# Patient Record
Sex: Female | Born: 1975 | Race: Black or African American | Hispanic: No | Marital: Married | Smoking: Never smoker
Health system: Southern US, Community
[De-identification: ages and names within clinical notes are randomized; demographics above are authoritative.]

## PROBLEM LIST (undated history)

## (undated) DIAGNOSIS — E559 Vitamin D deficiency, unspecified: Secondary | ICD-10-CM

## (undated) DIAGNOSIS — E785 Hyperlipidemia, unspecified: Secondary | ICD-10-CM

## (undated) DIAGNOSIS — I10 Essential (primary) hypertension: Secondary | ICD-10-CM

## (undated) HISTORY — DX: Essential (primary) hypertension: I10

## (undated) HISTORY — DX: Vitamin D deficiency, unspecified: E55.9

## (undated) HISTORY — PX: UPPER GI ENDOSCOPY: SHX6162

## (undated) HISTORY — DX: Hyperlipidemia, unspecified: E78.5

## (undated) HISTORY — PX: COLONOSCOPY: SHX174

---

## 1999-04-17 ENCOUNTER — Other Ambulatory Visit: Admission: RE | Admit: 1999-04-17 | Discharge: 1999-04-17 | Payer: Self-pay | Admitting: Obstetrics and Gynecology

## 1999-08-20 ENCOUNTER — Emergency Department (HOSPITAL_COMMUNITY): Admission: EM | Admit: 1999-08-20 | Discharge: 1999-08-20 | Payer: Self-pay | Admitting: Emergency Medicine

## 1999-09-09 ENCOUNTER — Encounter: Payer: Self-pay | Admitting: Family Medicine

## 1999-09-09 ENCOUNTER — Ambulatory Visit (HOSPITAL_COMMUNITY): Admission: RE | Admit: 1999-09-09 | Discharge: 1999-09-09 | Payer: Self-pay | Admitting: Family Medicine

## 1999-10-01 ENCOUNTER — Ambulatory Visit (HOSPITAL_COMMUNITY): Admission: RE | Admit: 1999-10-01 | Discharge: 1999-10-01 | Payer: Self-pay | Admitting: Family Medicine

## 1999-10-01 ENCOUNTER — Encounter: Payer: Self-pay | Admitting: Family Medicine

## 2000-01-01 DIAGNOSIS — E049 Nontoxic goiter, unspecified: Secondary | ICD-10-CM | POA: Insufficient documentation

## 2000-05-13 ENCOUNTER — Inpatient Hospital Stay (HOSPITAL_COMMUNITY): Admission: AD | Admit: 2000-05-13 | Discharge: 2000-05-13 | Payer: Self-pay | Admitting: *Deleted

## 2001-06-10 ENCOUNTER — Emergency Department (HOSPITAL_COMMUNITY): Admission: EM | Admit: 2001-06-10 | Discharge: 2001-06-10 | Payer: Self-pay | Admitting: Emergency Medicine

## 2002-01-14 ENCOUNTER — Other Ambulatory Visit: Admission: RE | Admit: 2002-01-14 | Discharge: 2002-01-14 | Payer: Self-pay | Admitting: Obstetrics and Gynecology

## 2002-05-06 ENCOUNTER — Other Ambulatory Visit: Admission: RE | Admit: 2002-05-06 | Discharge: 2002-05-06 | Payer: Self-pay | Admitting: Obstetrics and Gynecology

## 2002-07-31 ENCOUNTER — Emergency Department (HOSPITAL_COMMUNITY): Admission: EM | Admit: 2002-07-31 | Discharge: 2002-08-01 | Payer: Self-pay | Admitting: Emergency Medicine

## 2002-12-14 ENCOUNTER — Other Ambulatory Visit: Admission: RE | Admit: 2002-12-14 | Discharge: 2002-12-14 | Payer: Self-pay | Admitting: Obstetrics and Gynecology

## 2003-09-11 ENCOUNTER — Encounter: Admission: RE | Admit: 2003-09-11 | Discharge: 2003-09-11 | Payer: Self-pay | Admitting: Infectious Diseases

## 2003-10-02 ENCOUNTER — Ambulatory Visit: Payer: Self-pay | Admitting: Internal Medicine

## 2003-10-16 ENCOUNTER — Other Ambulatory Visit: Admission: RE | Admit: 2003-10-16 | Discharge: 2003-10-16 | Payer: Self-pay | Admitting: Obstetrics and Gynecology

## 2003-11-20 ENCOUNTER — Other Ambulatory Visit: Admission: RE | Admit: 2003-11-20 | Discharge: 2003-11-20 | Payer: Self-pay | Admitting: Obstetrics and Gynecology

## 2003-12-21 ENCOUNTER — Ambulatory Visit (HOSPITAL_COMMUNITY): Admission: RE | Admit: 2003-12-21 | Discharge: 2003-12-21 | Payer: Self-pay | Admitting: Obstetrics and Gynecology

## 2004-02-26 ENCOUNTER — Emergency Department (HOSPITAL_COMMUNITY): Admission: EM | Admit: 2004-02-26 | Discharge: 2004-02-26 | Payer: Self-pay | Admitting: Family Medicine

## 2004-11-18 ENCOUNTER — Other Ambulatory Visit: Admission: RE | Admit: 2004-11-18 | Discharge: 2004-11-18 | Payer: Self-pay | Admitting: Obstetrics and Gynecology

## 2005-06-12 ENCOUNTER — Emergency Department (HOSPITAL_COMMUNITY): Admission: EM | Admit: 2005-06-12 | Discharge: 2005-06-12 | Payer: Self-pay | Admitting: Family Medicine

## 2006-03-02 ENCOUNTER — Emergency Department (HOSPITAL_COMMUNITY): Admission: EM | Admit: 2006-03-02 | Discharge: 2006-03-02 | Payer: Self-pay | Admitting: Family Medicine

## 2006-03-05 ENCOUNTER — Emergency Department (HOSPITAL_COMMUNITY): Admission: EM | Admit: 2006-03-05 | Discharge: 2006-03-05 | Payer: Self-pay | Admitting: Family Medicine

## 2007-07-30 ENCOUNTER — Inpatient Hospital Stay (HOSPITAL_COMMUNITY): Admission: AD | Admit: 2007-07-30 | Discharge: 2007-07-31 | Payer: Self-pay | Admitting: Obstetrics and Gynecology

## 2007-10-10 ENCOUNTER — Inpatient Hospital Stay (HOSPITAL_COMMUNITY): Admission: AD | Admit: 2007-10-10 | Discharge: 2007-10-10 | Payer: Self-pay | Admitting: Obstetrics and Gynecology

## 2008-02-04 ENCOUNTER — Inpatient Hospital Stay (HOSPITAL_COMMUNITY): Admission: AD | Admit: 2008-02-04 | Discharge: 2008-02-05 | Payer: Self-pay | Admitting: Obstetrics and Gynecology

## 2008-02-11 ENCOUNTER — Inpatient Hospital Stay (HOSPITAL_COMMUNITY): Admission: AD | Admit: 2008-02-11 | Discharge: 2008-02-15 | Payer: Self-pay | Admitting: Obstetrics and Gynecology

## 2008-02-11 ENCOUNTER — Encounter (INDEPENDENT_AMBULATORY_CARE_PROVIDER_SITE_OTHER): Payer: Self-pay | Admitting: Obstetrics and Gynecology

## 2008-02-16 ENCOUNTER — Encounter: Admission: RE | Admit: 2008-02-16 | Discharge: 2008-03-14 | Payer: Self-pay | Admitting: Obstetrics and Gynecology

## 2008-10-31 ENCOUNTER — Ambulatory Visit: Payer: Self-pay | Admitting: Family

## 2008-10-31 DIAGNOSIS — N390 Urinary tract infection, site not specified: Secondary | ICD-10-CM

## 2008-10-31 DIAGNOSIS — I10 Essential (primary) hypertension: Secondary | ICD-10-CM | POA: Insufficient documentation

## 2008-10-31 DIAGNOSIS — N760 Acute vaginitis: Secondary | ICD-10-CM | POA: Insufficient documentation

## 2008-10-31 DIAGNOSIS — E669 Obesity, unspecified: Secondary | ICD-10-CM

## 2008-10-31 LAB — CONVERTED CEMR LAB
Clue Cells Wet Prep HPF POC: NONE SEEN
Specific Gravity, Urine: 1.025
Trich, Wet Prep: NONE SEEN
pH: 6.5

## 2008-11-01 ENCOUNTER — Encounter: Payer: Self-pay | Admitting: Family

## 2008-11-01 LAB — CONVERTED CEMR LAB
Chlamydia, DNA Probe: NEGATIVE
GC Probe Amp, Genital: NEGATIVE
TSH: 0.827 microintl units/mL (ref 0.350–4.500)
Total CHOL/HDL Ratio: 4.9

## 2009-03-28 ENCOUNTER — Ambulatory Visit: Payer: Self-pay | Admitting: Family

## 2009-03-28 LAB — CONVERTED CEMR LAB
Glucose, Urine, Semiquant: NEGATIVE
Ketones, urine, test strip: NEGATIVE
Specific Gravity, Urine: 1.015
pH: 6.5

## 2009-03-30 ENCOUNTER — Encounter: Payer: Self-pay | Admitting: Family

## 2009-04-11 ENCOUNTER — Ambulatory Visit: Payer: Self-pay | Admitting: Family

## 2009-04-11 LAB — CONVERTED CEMR LAB
Chloride: 99 meq/L (ref 96–112)
Creatinine, Ser: 1.04 mg/dL (ref 0.40–1.20)

## 2009-04-12 ENCOUNTER — Telehealth (INDEPENDENT_AMBULATORY_CARE_PROVIDER_SITE_OTHER): Payer: Self-pay | Admitting: *Deleted

## 2009-04-12 ENCOUNTER — Telehealth: Payer: Self-pay | Admitting: Family

## 2009-05-07 ENCOUNTER — Ambulatory Visit: Payer: Self-pay | Admitting: Family

## 2009-05-07 LAB — CONVERTED CEMR LAB
BUN: 13 mg/dL (ref 6–23)
Chloride: 101 meq/L (ref 96–112)
Potassium: 4 meq/L (ref 3.5–5.3)

## 2009-10-31 ENCOUNTER — Encounter: Payer: Self-pay | Admitting: Family

## 2009-11-06 ENCOUNTER — Encounter: Payer: Self-pay | Admitting: Family

## 2009-11-26 ENCOUNTER — Ambulatory Visit: Payer: Self-pay | Admitting: Family

## 2009-11-26 DIAGNOSIS — R350 Frequency of micturition: Secondary | ICD-10-CM

## 2009-11-26 LAB — CONVERTED CEMR LAB
Bilirubin Urine: NEGATIVE
CO2: 23 meq/L (ref 19–32)
Chloride: 107 meq/L (ref 96–112)
Glucose, Bld: 91 mg/dL (ref 70–99)
Ketones, urine, test strip: NEGATIVE
Potassium: 4.3 meq/L (ref 3.5–5.3)
Sodium: 138 meq/L (ref 135–145)
Urobilinogen, UA: 0.2

## 2009-11-27 ENCOUNTER — Encounter: Payer: Self-pay | Admitting: Family

## 2009-11-29 ENCOUNTER — Telehealth: Payer: Self-pay | Admitting: Family

## 2009-11-30 ENCOUNTER — Encounter: Payer: Self-pay | Admitting: Family

## 2009-11-30 ENCOUNTER — Telehealth: Payer: Self-pay | Admitting: Family

## 2009-12-04 ENCOUNTER — Other Ambulatory Visit: Admission: RE | Admit: 2009-12-04 | Discharge: 2009-12-04 | Payer: Self-pay | Admitting: Internal Medicine

## 2009-12-04 ENCOUNTER — Ambulatory Visit: Payer: Self-pay | Admitting: Family

## 2009-12-04 LAB — HM PAP SMEAR

## 2009-12-07 ENCOUNTER — Encounter: Payer: Self-pay | Admitting: Family

## 2010-01-23 ENCOUNTER — Telehealth: Payer: Self-pay | Admitting: Family

## 2010-02-19 NOTE — Assessment & Plan Note (Signed)
Summary: 2 wk. f/u on BP- jr rsc with pt/mhf   Vital Signs:  Patient profile:   35 year old female Height:      66 inches Weight:      233.01 pounds BMI:     37.74 Temp:     98.3 degrees F oral Pulse rate:   82 / minute Pulse rhythm:   regular Resp:     18 per minute BP sitting:   132 / 100  (right arm) Cuff size:   large  Vitals Entered By: Mervin Kung CMA (May 07, 2009 8:37 AM) CC: room 6 jFollow up on blood pressure. Is Patient Diabetic? No   CC:  room 6 jFollow up on blood pressure.Marland Kitchen  History of Present Illness: Marie Perez is a 35 year old female who presents today for follow up of her BP.  Notes that she checked her BP was 138/86.  Denies LE swelling or HA.  Was fatigued but feels better with the addition of K+ supplement.    Allergies (verified): No Known Drug Allergies  Physical Exam  General:  Well-developed,well-nourished,in no acute distress; alert,appropriate and cooperative throughout examination Lungs:  Normal respiratory effort, chest expands symmetrically. Lungs are clear to auscultation, no crackles or wheezes. Heart:  Normal rate and regular rhythm. S1 and S2 normal without gallop, murmur, click, rub or other extra sounds. Extremities:  No edema   Impression & Recommendations:  Problem # 1:  HYPERTENSION (ICD-401.9) Assessment Unchanged SBP is improved, but DBP is acutally higher today,  will continue HCTZ and plan to increase Labetalol.  Follow up in 1 month.  Patient was counselled on obesity and weight loss during this 15 minute visit.  Greater than 50% of the time was spent counselling her on diet/weight loss.   Her updated medication list for this problem includes:    Hydrochlorothiazide 25 Mg Tabs (Hydrochlorothiazide) ..... One tablet by mouth daily    Labetalol Hcl 200 Mg Tabs (Labetalol hcl) ..... One tablet by mouth two times a day  BP today: 132/100 Prior BP: 146/90 (04/11/2009)  Labs Reviewed: K+: 3.4 (04/11/2009) Creat: : 1.04  (04/11/2009)   Chol: 195 (11/01/2008)   HDL: 40 (11/01/2008)   LDL: 134 (11/01/2008)   TG: 105 (11/01/2008)  Orders: T-Basic Metabolic Panel [id number])  Complete Medication List: 1)  Hydrochlorothiazide 25 Mg Tabs (Hydrochlorothiazide) .... One tablet by mouth daily 2)  Labetalol Hcl 200 Mg Tabs (Labetalol hcl) .... One tablet by mouth two times a day 3)  Potassium Chloride Cr 10 Meq Cr-tabs (Potassium chloride) .... One tablert by mouth daily  Patient Instructions: 1)  Please follow up in 1 month for a nurse visit/BP check.   Prescriptions: POTASSIUM CHLORIDE CR 10 MEQ CR-TABS (POTASSIUM CHLORIDE) one tablert by mouth daily  #30 x 1   Entered and Authorized by:   Lemont Fillers FNP   Signed by:   Lemont Fillers FNP on 05/07/2009   Method used:   Electronically to        [facility] [address] [phone]* (retail)       [address] [place], [place]  [address]       Ph: [phone]       Fax: [phone]   RxID:   [id number] LABETALOL HCL 200 MG TABS (LABETALOL HCL) one tablet by mouth two times a day  #60 x 1   Entered and Authorized by:   Lemont Fillers FNP  Signed by:   Lemont Fillers FNP on 05/07/2009   Method used:   Electronically to        [facility] [phone]* (retail)       [address] [place], [place]  [address]       Ph: [phone]       Fax: [phone]   RxID:   [id number]   Current Allergies (reviewed today): No known allergies

## 2010-02-19 NOTE — Progress Notes (Signed)
Summary: lab result--lm  Phone Note Outgoing Call   Summary of Call: Please call patient and let her know K+ is a little low, I would like to add a small potassium supplement and plan to have her return as scheduled 4/6. Initial call taken by: Lemont Fillers FNP,  April 12, 2009 11:34 PM  Follow-up for Phone Call        Left message on machine to return call. Mervin Kung CMA  April 13, 2009 11:48 AM   Additional Follow-up for Phone Call Additional follow up Details #1::        Pt. called back and I informed her that her K+ is a little low so Helaina Stefano called her in a potassium supplement and pt. to keep f/u appt. Additional Follow-up by: Michaelle Copas,  April 13, 2009 2:33 PM    New/Updated Medications: POTASSIUM CHLORIDE CR 10 MEQ CR-TABS (POTASSIUM CHLORIDE) one tablert by mouth daily Prescriptions: POTASSIUM CHLORIDE CR 10 MEQ CR-TABS (POTASSIUM CHLORIDE) one tablert by mouth daily  #30 x 0   Entered and Authorized by:   Lemont Fillers FNP   Signed by:   Lemont Fillers FNP on 04/12/2009   Method used:   Electronically to        [facility] [address] [phone]* (retail)       [address] [place], [place]  [address]       Ph: [phone]       Fax: [phone]   RxID:   [id number]

## 2010-02-19 NOTE — Letter (Signed)
   [place] at [facility] [address] [place], [place]  [address]  [place] Phone: [phone]      March 30, 2009   GALILEE PIERRON [address] [place], [place] [address]  RE:  LAB RESULTS  Dear  Ms. Krieger,  The following is an interpretation of your most recent lab tests.  Please take note of any instructions provided or changes to medications that have resulted from your lab work.    Urine culture did have some bacteria.  Please complete antibiotics and call if your symptoms worsen or do not improve.   Sincerely Yours,    Lemont Fillers FNP

## 2010-02-19 NOTE — Letter (Signed)
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 [address]   [place], [place] [address]   Phone: [phone]  Fax: [phone]    November 30, 2009   Employee:  BOBI DAUDELIN    To Whom It May Concern:   For Medical reasons, please excuse the above named employee from work for the following dates:  Start:   11/30/09  End:   12/03/09  If you need additional information, please feel free to contact our office.         Sincerely,    Lemont Fillers FNP

## 2010-02-19 NOTE — Letter (Signed)
   [place] at [facility] [address] [place], [place]  [address]  [place] Phone: [phone]      December 07, 2009   [facility] [address] [place], [place] [address]  RE:  LAB RESULTS  Dear  Ms. Ewen,  The following is an interpretation of your most recent lab tests.  Please take note of any instructions provided or changes to medications that have resulted from your lab work.  Pap Smear: normal     Sincerely Yours,    Lemont Fillers FNP  Appended Document:  Mailed.

## 2010-02-19 NOTE — Progress Notes (Signed)
Summary: Work Note  Phone Note Call from Patient Call back at [employer] [phone]   Caller: Patient Call For: Lemont Fillers FNP Summary of Call: patient called and left voice message stating she picked the rx and is feeling better since starting it. Her message states that she has taken the day off due to illness and she would lke to knolw if she could get a work note Initial call taken by: Glendell Docker CMA,  November 30, 2009 11:17 AM  Follow-up for Phone Call        Called patient, she is feeling better today- but stayed home from work to rest.  WIll provide note for her to pick up at the front desk.  She will pick up on Monday. Follow-up by: Lemont Fillers FNP,  November 30, 2009 1:17 PM

## 2010-02-19 NOTE — Letter (Signed)
Summary: Primary Care Appointment Letter  [place] at [facility]  [address]   [place], [place] [address]   Phone: [phone]  Fax: [phone]    10/31/2009 MRN: [id number]  KAILEA DANNEMILLER [address] [place], [place]  [address]  Dear Ms. Kestenbaum,   Your Primary Care Physician Lemont Fillers FNP has indicated that:    _______it is time to schedule an appointment.   Please call our office as soon as possible. Our phone number is [phone]. Please press option 1. Our office is open 8a-12noon and 1p-5p, Monday through Friday.     Thank you,     Primary Care Scheduler  Appended Document: Primary Care Appointment Letter Mailed letter  11/01/2009

## 2010-02-19 NOTE — Progress Notes (Signed)
Summary: new rx   Phone Note Refill Request Message from:  Fax from Pharmacy on April 12, 2009 8:28 AM  Refills Requested: Medication #1:  LABETALOL HCL 100 MG TABS one tablet by mouth two times a day.   Dosage confirmed as above?Dosage Confirmed   Brand Name Necessary? No   Supply Requested: 1 month needs new rx per conversation at visit    Method Requested: Electronic Next Appointment Scheduled: 04-25-09 8am [place]  Initial call taken by: Roselle Locus,  April 12, 2009 8:29 AM  Follow-up for Phone Call        Spoke with [facility] and cancelled rx sent on 04/11/09. Labetalol sent to [facility] #60 x no refill. Nicki Guadalajara Fergerson CMA  April 12, 2009 8:58 AM     Prescriptions: LABETALOL HCL 100 MG TABS (LABETALOL HCL) one tablet by mouth two times a day  #60 x 0   Entered by:   Mervin Kung CMA   Authorized by:   Lemont Fillers FNP   Signed by:   Mervin Kung CMA on 04/12/2009   Method used:   Electronically to        [facility] [address] [phone]* (retail)       [address] [place], [place]  [address]       Ph: [phone]       Fax: [phone]   RxID:   [id number]

## 2010-02-19 NOTE — Assessment & Plan Note (Signed)
Summary: 2 wk f/u BP Check - jr   Vital Signs:  Patient profile:   35 year old female Height:      66 inches Weight:      231.50 pounds BMI:     37.50 Temp:     98.6 degrees F oral Pulse rate:   76 / minute Pulse rhythm:   regular Resp:     16 per minute BP sitting:   146 / 90  (right arm) Cuff size:   large  Vitals Entered By: Mervin Kung CMA (April 11, 2009 1:35 PM) CC: room 5  2 week follow up on blood pressure Is Patient Diabetic? No   CC:  room 5  2 week follow up on blood pressure.  History of Present Illness: Marie Perez is a 35 year old female who presents today to follow up on her blood pressure.  Tells me that she has been compliant with her blood pressure medications.  Tolerating well, no complaints.    UTI- tells me that she completed cipro, urinary symptoms have resolved.  Culture grew 90K, polymicrobial.  Allergies (verified): No Known Drug Allergies  Review of Systems       Denies dysuria, or frequecy  Physical Exam  General:  Well-developed,well-nourished,in no acute distress; alert,appropriate and cooperative throughout examination Lungs:  Normal respiratory effort, chest expands symmetrically. Lungs are clear to auscultation, no crackles or wheezes. Heart:  Normal rate and regular rhythm. S1 and S2 normal without gallop, murmur, click, rub or other extra sounds. Extremities:  trace left pedal edema and trace right pedal edema.     Impression & Recommendations:  Problem # 1:  HYPERTENSION (ICD-401.9) Assessment Improved BP is improving, but not yet at goal.  Will plan to add labetalol and have patient return in 2 weeks for f/u BP.  Check BMET today. Her updated medication list for this problem includes:    Hydrochlorothiazide 25 Mg Tabs (Hydrochlorothiazide) ..... One tablet by mouth daily    Labetalol Hcl 100 Mg Tabs (Labetalol hcl) ..... One tablet by mouth two times a day  BP today: 146/90 Prior BP: 168/96 (03/28/2009)  Labs  Reviewed: Chol: 195 (11/01/2008)   HDL: 40 (11/01/2008)   LDL: 134 (11/01/2008)   TG: 105 (11/01/2008)  Orders: T-Basic Metabolic Panel [id number])  Problem # 2:  UTI (ICD-599.0) Assessment: Improved resolved The following medications were removed from the medication list:    Cipro 250 Mg Tabs (Ciprofloxacin hcl) ..... One tab by mouth two times a day x 3 days  Complete Medication List: 1)  Hydrochlorothiazide 25 Mg Tabs (Hydrochlorothiazide) .... One tablet by mouth daily 2)  Labetalol Hcl 100 Mg Tabs (Labetalol hcl) .... One tablet by mouth two times a day  Patient Instructions: 1)  Please follow up in 2 weeks for follow up of your blood pressure. Prescriptions: HYDROCHLOROTHIAZIDE 25 MG TABS (HYDROCHLOROTHIAZIDE) one tablet by mouth daily  #30 x 2   Entered and Authorized by:   Lemont Fillers FNP   Signed by:   Lemont Fillers FNP on 04/11/2009   Method used:   Electronically to        [facility] [address] [phone]* (retail)       [address] [place], [place]  [address]       Ph: [phone]       Fax: [phone]   RxID:   [id number] LABETALOL HCL 100 MG TABS (LABETALOL HCL) one tablet by mouth  two times a day  #60 x 0   Entered and Authorized by:   Lemont Fillers FNP   Signed by:   Lemont Fillers FNP on 04/11/2009   Method used:   [facility] to        [facility] [address] [phone]* (retail)       [address]       [place], [place]  [address]       Ph: [phone] or [phone]       Fax: [phone]   RxID:   [id number]   Current Allergies (reviewed today): No known allergies

## 2010-02-19 NOTE — Progress Notes (Signed)
Summary: no improvement  Phone Note Call from Patient Call back at [phone]   Caller: Patient Call For: Lemont Fillers FNP Summary of Call: Pt states she has had no improvement in head pressure or sinus drainage. Now has a non productive cough. Also  has vaginal discharge since taking the Amoxicillin. Pt states she was told we would call something else in if this did not help her symptoms. Pt aware it may be tomorrow before we get back with her. Please advise. Nicki Guadalajara Fergerson CMA Duncan Dull)  November 29, 2009 8:16 AM   Follow-up for Phone Call        Patient to discontinue amoxicillin, start ceftin.  Also [facility] to [facility] for diflucan.  If fever, worsening symptoms or if no improvement by tomorrow, she needs to be re-evaluated in the office.   Follow-up by: Lemont Fillers FNP,  November 29, 2009 11:43 AM  Additional Follow-up for Phone Call Additional follow up Details #1::        Left detailed message on pt's home phone re: Melissa's instructions. Nicki Guadalajara Fergerson CMA [place] Dull)  November 29, 2009 3:46 PM     New/Updated Medications: CEFTIN 500 MG TABS (CEFUROXIME AXETIL) one tablet by mouth two times a day x 10 days FLUCONAZOLE 150 MG TABS (FLUCONAZOLE) one tablet by mouth today, may repeat in 3 days if no improvement in vaginal itching. Prescriptions: FLUCONAZOLE 150 MG TABS (FLUCONAZOLE) one tablet by mouth today, may repeat in 3 days if no improvement in vaginal itching.  #2 x 0   Entered and Authorized by:   Lemont Fillers FNP   Signed by:   Lemont Fillers FNP on 11/29/2009   Method used:   Electronically to        [facility] [address] [phone]* (retail)       [address] [place], [place]  [address]       Ph: [phone]       Fax: [phone]   RxID:   [id number] CEFTIN 500 MG TABS (CEFUROXIME AXETIL) one tablet by mouth two times a day x 10 days  #20 x 0   Entered and Authorized by:   Lemont Fillers FNP   Signed by:   Lemont Fillers FNP on 11/29/2009   Method used:   Electronically to        [facility] [address] [phone]* (retail)       [address] [place], [place]  [address]       Ph: [phone]       Fax: [phone]   RxID:   [id number]

## 2010-02-19 NOTE — Assessment & Plan Note (Signed)
Summary: CPX/MHF--Rm5   Vital Signs:  Patient profile:   35 year old female Menstrual status:  irregular LMP:     10/31/2009 Height:      66 inches Weight:      233.50 pounds BMI:     37.82 Temp:     98.1 degrees F oral Pulse rate:   84 / minute Pulse rhythm:   regular Resp:     16 per minute BP sitting:   146 / 90  (right arm) Cuff size:   large  Vitals Entered By: Mervin Kung CMA (AAMA) (December 04, 2009 8:00 AM) CC: Pt here fasting for physical and pap smear. Is Patient Diabetic? No Pain Assessment Patient in pain? no      LMP (date): 10/31/2009     Menstrual Status irregular Enter LMP: 10/31/2009 Last PAP Result normal   Primary Care Provider:  Lemont Fillers FNP  CC:  Pt here fasting for physical and pap smear.Marland Kitchen  History of Present Illness: Marie Perez is a 35 year old female who presents today for CPX. Exercise- treadmill, walking- 3x a week.   Diet has improved, eating more baked foods, no fried foods.  Pap smears have always been normal.    Preventive Screening-Counseling & Management  Alcohol-Tobacco     Alcohol drinks/day: 0     Alcohol Counseling: not indicated; patient does not drink     Smoking Status: never     Tobacco Counseling: not indicated; no tobacco use  Caffeine-Diet-Exercise     Caffeine use/day: 1 beverage per day     Caffeine Counseling: not indicated; caffeine use is not excessive or problematic     Does Patient Exercise: yes     Times/week: <3  Allergies (verified): No Known Drug Allergies  Past History:  Past Medical History: Last updated: 10/31/2008 Hypertension  Past Surgical History: Last updated: 10/31/2008 Caesarean section 01/2008  Family History: Mom- living HTN Dad- living, DM Brother- younger alive and well Daughter 1/10- alive and well, (ear tubes)  Social History: Occupation: Archivist Married 6 years  1 daughter 8 months never smoked No alcohol No drugs  Review of Systems   Constitutional: Denies Fever ENT:  Denies nasal congestion is improved Resp: mild dry cough CV:  Denies Chest Pain GI:  Denies nausea or vomitting GU: Denies dysuria Lymphatic: Denies lymphadenopathy Musculoskeletal:  Denies muscle/joint pain Skin:  Denies Rashes or lesions Psychiatric: Denies depression Neuro: Denies numbness     Physical Exam  General:  Well-developed,well-nourished,in no acute distress; alert,appropriate and cooperative throughout examination Head:  Normocephalic and atraumatic without obvious abnormalities. No apparent alopecia or balding. Eyes:  No corneal or conjunctival inflammation noted. EOMI. Perrla.  Ears:  External ear exam shows no significant lesions or deformities.  Otoscopic examination reveals clear canals, tympanic membranes are intact bilaterally without bulging, retraction, inflammation or discharge. Hearing is grossly normal bilaterally. Mouth:  Oral mucosa and oropharynx without lesions or exudates.  Teeth in good repair. Neck:  No deformities, masses, or tenderness noted. Breasts:  No mass, nodules, thickening, tenderness, bulging, retraction, inflamation, nipple discharge or skin changes noted.   Lungs:  Normal respiratory effort, chest expands symmetrically. Lungs are clear to auscultation, no crackles or wheezes. Heart:  Normal rate and regular rhythm. S1 and S2 normal without gallop, murmur, click, rub or other extra sounds. Abdomen:  Bowel sounds positive,abdomen soft and non-tender without masses, organomegaly or hernias noted. Genitalia:  Pelvic Exam:        External: normal  female genitalia without lesions or masses        Vagina: normal without lesions or masses        Cervix: normal without lesions or masses        Adnexa: normal bimanual exam without masses or fullness        Uterus: normal by palpation        Pap smear: performed Msk:  No deformity or scoliosis noted of thoracic or lumbar spine.   Pulses:  radial,dorsalis pedis  and posterior tibial pulses are full and equal bilaterally Extremities:  No clubbing, cyanosis, edema, or deformity noted with normal full range of motion of all joints.   Neurologic:  No cranial nerve deficits noted. Station and gait are normal. Plantar reflexes are down-going bilaterally. DTRs are symmetrical throughout. Sensory, motor and coordinative functions appear intact. Skin:  hyperpigmentation noted around mouth. Cervical Nodes:  No lymphadenopathy noted Psych:  Cognition and judgment appear intact. Alert and cooperative with normal attention span and concentration. No apparent delusions, illusions, hallucinations   Impression & Recommendations:  Problem # 1:  Preventive Health Care (ICD-V70.0) Assessment Comment Only immunizations were reviewed and are up-to-date. Pap Smear was performed today. Patient was counseled on diet exercise and weight loss. patient was instructed to obtain the records from her health screening laboratory fasting results. She is to bring these results to our office so that we may review them.  Complete Medication List: 1)  Labetalol Hcl 200 Mg Tabs (Labetalol hcl) .... One tablet by mouth two times a day  Patient Instructions: 1)  Keep up the good work with the exercise. 2)  Follow up in 3 months.   Orders Added: 1)  Est. Patient 18-39 years [[address]395]   Immunization History:  Influenza Immunization History:    Influenza:  historical (10/20/2009)   Immunization History:  Influenza Immunization History:    Influenza:  Historical (10/20/2009)  Current Allergies (reviewed today): No known allergies

## 2010-02-19 NOTE — Assessment & Plan Note (Signed)
Summary: blood pressure check congestion /mhf--Rm 5   Vital Signs:  Patient profile:   35 year old female Height:      66 inches Weight:      234.50 pounds BMI:     37.99 Temp:     99.0 degrees F oral Pulse rate:   66 / minute Pulse rhythm:   regular Resp:     18 per minute BP sitting:   112 / 90  (right arm) Cuff size:   large  Vitals Entered By: Mervin Kung CMA (AAMA) (November 26, 2009 8:03 AM) CC: Rm 5   Pt here for follow up on her blood pressure. Also having head pressure and sinus drainage. Has taken OTC Mucinex with some relief of head congestion. Is Patient Diabetic? No Pain Assessment Patient in pain? no      Comments Pt states she thought we stopped the HCTZ when she was put on Labetalol. Nicki Guadalajara Fergerson CMA Duncan Dull)  November 26, 2009 8:09 AM    Primary Care Jilliann Subramanian:  Lemont Fillers FNP  CC:  Rm 5   Pt here for follow up on her blood pressure. Also having head pressure and sinus drainage. Has taken OTC Mucinex with some relief of head congestion.Marland Kitchen  History of Present Illness: Ms Baine is a 35 yer old female who presents for follow up today of her HTN. She has complaints of sinus contestion today.  1.  Sinus congestion x 2 weeks, using mucinex with some improvement, green nasal discharge + sinus pressure bilaterally.  Denies ear pain or fever.  + Fatigue.  2.  HTN- Continues labetalol, stopped HCTZ, but still taking K+.  Denies edema, HA, or Chest pain.  3. Urinary Frequency- denies dysuria, but feels "like I go to the bathroom a lot. "  Allergies: No Known Drug Allergies  Past History:  Past Medical History: Last updated: 10/31/2008 Hypertension  Past Surgical History: Last updated: 10/31/2008 Caesarean section 01/2008  Review of Systems       see HPI  Physical Exam  General:  Well-developed,well-nourished,in no acute distress; alert,appropriate and cooperative throughout examination Head:  Normocephalic and atraumatic without obvious  abnormalities. No apparent alopecia or balding. Eyes:  PERRLA Ears:  Mild erythema noted R TM without bulging.  L TM is normal Mouth:  Oral mucosa and oropharynx without lesions or exudates.  Teeth in good repair. Lungs:  Normal respiratory effort, chest expands symmetrically. Lungs are clear to auscultation, no crackles or wheezes. Heart:  Normal rate and regular rhythm. S1 and S2 normal without gallop, murmur, click, rub or other extra sounds.   Impression & Recommendations:  Problem # 1:  HYPERTENSION (ICD-401.9) Assessment Improved Patient tells me that she stopped HCTZ but continued potassium.   I instructed her to discontinue both.  Plan to continue labetalol at current dose.  Check BMET today The following medications were removed from the medication list:    Hydrochlorothiazide 25 Mg Tabs (Hydrochlorothiazide) ..... One tablet by mouth daily Her updated medication list for this problem includes:    Labetalol Hcl 200 Mg Tabs (Labetalol hcl) ..... One tablet by mouth two times a day  Orders: TLB-BMP (Basic Metabolic Panel-BMET) (80048-METABOL)  Problem # 2:  SINUSITIS, ACUTE (ICD-461.9) Assessment: New Will plant to treat with amoxicillin. Instructions given to patient as noted in pt sign out.   Her updated medication list for this problem includes:    Amoxicillin 500 Mg Caps (Amoxicillin) ..... 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 One tablet by mouth two times a day 2)  Amoxicillin 500 Mg Caps (Amoxicillin) .... One cap by mouth three times a day x 10 days  Other Orders: UA Dipstick w/o Micro (manual) ([address]) Specimen Handling  (99000) T-Culture, Urine ([id number])  Patient Instructions: 1)  Please complete your lab work downstairs today. 2)  Call if you develop fever over 101, increasing sinus pressure, pain with eye movement, increased facial tenderness of swelling, or if you develop visual changes. 3)  Follow up for your physical as scheduled. Prescriptions: AMOXICILLIN 500 MG CAPS (AMOXICILLIN) one cap by mouth three times a day x 10 days  #30 x 0   Entered and Authorized by:   Lemont Fillers FNP   Signed by:   Lemont Fillers FNP on 11/26/2009   Method used:   Electronically to        [facility] [address] [phone]* (retail)       [address] [place], [place]  [address]       Ph: [phone]       Fax: [phone]   RxID:   [id number] LABETALOL HCL 200 MG TABS (LABETALOL HCL) one tablet by mouth two times a day  #60 x 2   Entered and Authorized by:   Lemont Fillers FNP   Signed by:   Lemont Fillers FNP on 11/26/2009   Method used:   Electronically to        [facility] [address] [phone]* (retail)       [address] [place], [place]  [address]       Ph: [phone]       Fax: [phone]   RxID:   [id number]    Orders Added: 1)  TLB-BMP (Basic Metabolic Panel-BMET) [80048-METABOL] 2)  T-TSH [[id number]] 3)  UA Dipstick w/o Micro (manual) [81002] 4)  Specimen Handling [99000] 5)  T-Culture, Urine [[id number]] 6)  Est. Patient Level III [99213]    Laboratory Results   Urine Tests   Date/Time Reported: Mervin Kung CMA Duncan Dull)  November 26, 2009 8:42 AM   Routine Urinalysis   Color: yellow Appearance: Clear Glucose: negative   (Normal Range: Negative) Bilirubin: negative   (Normal Range: Negative) Ketone: negative   (Normal Range: Negative) Spec. Gravity: 1.015   (Normal Range: 1.003-1.035) Blood: trace-intact   (Normal Range: Negative) pH: 6.5   (Normal Range: 5.0-8.0) Protein: trace   (Normal Range: Negative) Urobilinogen: 0.2   (Normal  Range: 0-1) Nitrite: negative   (Normal Range: Negative) Leukocyte Esterace: negative   (Normal Range: Negative)    Comments: Urine cultured.

## 2010-02-19 NOTE — Letter (Signed)
   [place] [facility] [address] [place], [place]  [address]  [place] Phone: [phone]      November 27, 2009   [facility] [address] [place], [place] [address]  RE:  LAB RESULTS  Dear  Ms. Brogden,  The following is an interpretation of your most recent lab tests.  Please take note of any instructions provided or changes to medications that have resulted from your lab work.  ELECTROLYTES:  Good - no changes needed  KIDNEY FUNCTION TESTS:  Good - no changes needed   THYROID STUDIES:  Thyroid studies normal TSH: 1.585      Sincerely Yours,    Lemont Fillers FNP  Appended Document:  mailed

## 2010-02-19 NOTE — Assessment & Plan Note (Signed)
Summary: ? Bacterial infection again- jr   Vital Signs:  Patient profile:   35 year old female Weight:      235 pounds BMI:     38.07 Temp:     98.3 degrees F oral Pulse rate:   92 / minute Pulse rhythm:   regular Resp:     16 per minute BP sitting:   168 / 96  (right arm) Cuff size:   large  Vitals Entered By: Mervin Kung CMA (March 28, 2009 1:43 PM) CC: room 5   Clear vaginal discharge x 1 week.  Feels fatigued.  Also has possible insect bite on left ankle.   CC:  room 5   Clear vaginal discharge x 1 week.  Feels fatigued.  Also has possible insect bite on left ankle.Marland Kitchen  History of Present Illness: Marie Perez is a 35 year old female who presents today with complaint of urinary frequency.  She notes a clear vaginal discharge- but denies associated itching or discharge.  This clear vaginal discharge she reports to be her baseline.  Last visit she was treated for a E Coli UTI.  Denies fever or low back  pain.    Blood pressure- Patient's blood pressure is noted to be elevated today.  Tells me that she has had issues with her blood pressure during her pregnancy.  BP today 168/96.    Allergies (verified): No Known Drug Allergies  Physical Exam  General:  Overweight female in NAD Head:  Normocephalic and atraumatic without obvious abnormalities. No apparent alopecia or balding. Lungs:  Normal respiratory effort, chest expands symmetrically. Lungs are clear to auscultation, no crackles or wheezes. Heart:  Normal rate and regular rhythm. S1 and S2 normal without gallop, murmur, click, rub or other extra sounds. Abdomen:  negative CVAT bilaterally   Impression & Recommendations:  Problem # 1:  UTI (ICD-599.0) Assessment New Will treat with by mouth cipro.  Pt advised to call if fever, if symptoms worsen or if they do not improve.  Her updated medication list for this problem includes:    Cipro 250 Mg Tabs (Ciprofloxacin hcl) ..... One tab by mouth two times a day x 3  days  Orders: UA Dipstick w/o Micro (manual) ([address]) T-Culture, Urine ([id number])  Problem # 2:  HYPERTENSION (ICD-401.9) Assessment: Deteriorated  Will start HCTZ, and have patient follow up in 2 weeks.  Pt advised to follow a low sodium diet.    Her updated medication list for this problem includes:    Hydrochlorothiazide 25 Mg Tabs (Hydrochlorothiazide) ..... One tablet by mouth daily  BP today: 168/96 Prior BP: 124/70 (10/31/2008)  Labs Reviewed: Chol: 195 (11/01/2008)   HDL: 40 (11/01/2008)   LDL: 134 (11/01/2008)   TG: 105 (11/01/2008)  Complete Medication List: 1)  Hydrochlorothiazide 25 Mg Tabs (Hydrochlorothiazide) .... One tablet by mouth daily 2)  Cipro 250 Mg Tabs (Ciprofloxacin hcl) .... One tab by mouth two times a day x 3 days  Patient Instructions: 1)  Please follow up in 2 weeks- soonre if symptoms worsen or do not improve. 2)  apply bacitracin  twice daily to left ankle wound until healed. 3)  Limit your Sodium (Salt). Prescriptions: CIPRO 250 MG TABS (CIPROFLOXACIN HCL) one tab by mouth two times a day x 3 days  #6 x 0   Entered and Authorized by:   Marie Fillers FNP   Signed by:   Marie Fillers FNP on 03/28/2009   Method used:   Electronically  to        [facility] [address] [phone]* (retail)       [address] [place], [place]  [address]       Ph: [phone]       Fax: [phone]   RxID:   [id number] HYDROCHLOROTHIAZIDE 25 MG TABS (HYDROCHLOROTHIAZIDE) one tablet by mouth daily  #30 x 0   Entered and Authorized by:   Marie Fillers FNP   Signed by:   Marie Fillers FNP on 03/28/2009   Method used:   Electronically to        [facility] [address] [phone]* (retail)       [address] [place], [place]  [address]       Ph: [phone]       Fax: [phone]   RxID:   [id number]   Current Allergies (reviewed today): No known allergies   Laboratory Results   Urine Tests    Routine Urinalysis   Color:  orange Appearance: Clear Glucose: negative   (Normal Range: Negative) Bilirubin: negative   (Normal Range: Negative) Ketone: negative   (Normal Range: Negative) Spec. Gravity: 1.015   (Normal Range: 1.003-1.035) Blood: trace-intact   (Normal Range: Negative) pH: 6.5   (Normal Range: 5.0-8.0) Protein: trace   (Normal Range: Negative) Urobilinogen: 0.2   (Normal Range: 0-1) Nitrite: negative   (Normal Range: Negative) Leukocyte Esterace: trace   (Normal Range: Negative)

## 2010-02-19 NOTE — Letter (Signed)
Summary: Generic Letter  [place] [facility]  [address]   [place], [place] [address]   Phone: [phone]  Fax: [phone]    11/06/2009  EDITH GROLEAU [address] [place], [place]  [address]  Dear Ms. Santizo,  I have reviewed your chart and see that you are due for follow up of your blood pressure. Please contact our office as soon as possible to schedule a follow up appointment.    Sincerely,   Sandford Craze FNP  Appended Document: Generic Letter Mailed.

## 2010-02-21 NOTE — Progress Notes (Signed)
Summary: ?yeast inf.  Phone Note Call from Patient Call back at Home Phone [phone] Call back at [facility] to leave detailed message   Caller: Patient Call For: Lemont Fillers FNP Summary of Call: Received voice message from pt stating she thinks she has a yeast infection and is requesting a prescription. Left message on machine for pt to return my call. Nicki Guadalajara Fergerson CMA Duncan Dull)  January 23, 2010 1:31 PM   Follow-up for Phone Call        Pt returned my call and states she is itching internally. Has a clear discharge but states this is not new to her.  Denies any other symptoms. States she had recently changed her laundry detergent and the itching started after that.  Pt is unable to take time off work. Please advise. Nicki Guadalajara Fergerson CMA Duncan Dull)  January 23, 2010 1:46 PM   Additional Follow-up for Phone Call Additional follow up Details #1::        I recommend a Monistat 3 day treatment.  If no improvement with Monistat- she and will need to book  appointment for exam.  Additional Follow-up by: Lemont Fillers FNP,  January 23, 2010 2:09 PM    Additional Follow-up for Phone Call Additional follow up Details #2::    Left detailed message on pt's machine and to call if any questions. Nicki Guadalajara Fergerson CMA Duncan Dull)  January 23, 2010 3:32 PM

## 2010-02-27 ENCOUNTER — Encounter: Payer: Self-pay | Admitting: Family

## 2010-02-27 ENCOUNTER — Ambulatory Visit (INDEPENDENT_AMBULATORY_CARE_PROVIDER_SITE_OTHER): Payer: BC Managed Care – PPO | Admitting: Family

## 2010-02-27 DIAGNOSIS — I10 Essential (primary) hypertension: Secondary | ICD-10-CM

## 2010-02-27 DIAGNOSIS — R5383 Other fatigue: Secondary | ICD-10-CM

## 2010-02-27 DIAGNOSIS — Z111 Encounter for screening for respiratory tuberculosis: Secondary | ICD-10-CM

## 2010-02-27 DIAGNOSIS — N760 Acute vaginitis: Secondary | ICD-10-CM

## 2010-02-27 DIAGNOSIS — R5381 Other malaise: Secondary | ICD-10-CM | POA: Insufficient documentation

## 2010-02-27 DIAGNOSIS — N898 Other specified noninflammatory disorders of vagina: Secondary | ICD-10-CM

## 2010-02-27 DIAGNOSIS — R350 Frequency of micturition: Secondary | ICD-10-CM

## 2010-02-27 LAB — CONVERTED CEMR LAB
Basophils Relative: 0 % (ref 0–1)
Bilirubin Urine: NEGATIVE
Candida species: NEGATIVE
Ketones, urine, test strip: NEGATIVE
Lymphs Abs: 2 10*3/uL (ref 0.7–4.0)
MCHC: 32.9 g/dL (ref 30.0–36.0)
Mono Screen: NEGATIVE
Monocytes Relative: 6 % (ref 3–12)
Neutro Abs: 4.4 10*3/uL (ref 1.7–7.7)
Neutrophils Relative %: 62 % (ref 43–77)
Platelets: 254 10*3/uL (ref 150–400)
Protein, U semiquant: NEGATIVE
RBC: 4.49 M/uL (ref 3.87–5.11)
Trichomonal Vaginitis: NEGATIVE
Urobilinogen, UA: 0.2
WBC: 7.1 10*3/uL (ref 4.0–10.5)

## 2010-02-28 ENCOUNTER — Telehealth: Payer: Self-pay | Admitting: Family

## 2010-03-01 ENCOUNTER — Encounter: Payer: Self-pay | Admitting: Family

## 2010-03-07 NOTE — Assessment & Plan Note (Signed)
Summary: TB skin reading  Nurse Visit   Primary Care Provider:  Lemont Fillers FNP   History of Present Illness: CC:  TB skin test recheck  The patient presented after 48 hours to check the injection site for positive or negative reaction.  Injection site examination: No firm bump forms at the test site.  Slightly reddish appearance and diameter was smaller than 5mm.  Assessment & Plan: Negative TB skin test. Patient was counseled to call if she experiences any irritation of the site.  Nicki Guadalajara Fergerson CMA Duncan Dull)  March 01, 2010 8:49 AM    Allergies: No Known Drug Allergies  PPD Results    Date of reading: 03/01/2010    Results: < 5mm    Interpretation: negative

## 2010-03-07 NOTE — Letter (Signed)
   [place] [facility] [address] [place], [place]  [address]  [place] Phone: [phone]      March 01, 2010   [facility] [address] [place], [place] [address]  RE:  LAB RESULTS  Dear  Ms. Slaby,  The following is an interpretation of your most recent lab tests.  Please take note of any instructions provided or changes to medications that have resulted from your lab work.    CBC:  Good - no changes needed Your mono test was negative.   Sincerely Yours,    Lemont Fillers FNP  Appended Document:  mailed

## 2010-03-07 NOTE — Progress Notes (Signed)
Summary: lab result  Phone Note Outgoing Call   Summary of Call: Pls call patient and let her know that wet prep shows bacteria.  I have called in Metrogel to her pharmacy.   Initial call taken by: Lemont Fillers FNP,  February 28, 2010 1:09 PM  Follow-up for Phone Call        Left message on machine to return my call. Nicki Guadalajara Fergerson CMA (AAMA)  February 28, 2010 1:27 PM     New/Updated Medications: METROGEL-VAGINAL 0.75 % GEL (METRONIDAZOLE) one applicator full in vagina at bedtime for 5 days. Prescriptions: METROGEL-VAGINAL 0.75 % GEL (METRONIDAZOLE) one applicator full in vagina at bedtime for 5 days.  #1 x 0   Entered and Authorized by:   Lemont Fillers FNP   Signed by:   Lemont Fillers FNP on 02/28/2010   Method used:   Electronically to        [facility] [address] [phone]* (retail)       [address] [place], [place]  [address]       Ph: [phone]       Fax: [phone]   RxID:   [id number]   Appended Document: lab result Pt notified.

## 2010-03-07 NOTE — Assessment & Plan Note (Signed)
Summary: Vaginal discharge/  TB skin/hea--rm 5   Vital Signs:  Patient profile:   35 year old female Menstrual status:  irregular LMP:     02/22/2010 Height:      66 inches Weight:      239.50 pounds BMI:     38.80 Temp:     98.2 degrees F oral Pulse rate:   66 / minute Pulse rhythm:   regular Resp:     16 per minute BP sitting:   144 / 100  (right arm) Cuff size:   large  Vitals Entered By: Mervin Kung CMA Duncan Dull) (February 27, 2010 8:15 AM) CC: Pt here today for clear vaginal discharge x 1 week, now subsided. Has urinary frequency x 3 days and needs TB skin test for job. Feels very fatigued. Is Patient Diabetic? No Pain Assessment Patient in pain? no      LMP (date): 02/22/2010     Enter LMP: 02/22/2010 Last PAP Result NEGATIVE FOR INTRAEPITHELIAL LESIONS OR MALIGNANCY.   Primary Care Provider:  Lemont Fillers FNP  CC:  Pt here today for clear vaginal discharge x 1 week and now subsided. Has urinary frequency x 3 days and needs TB skin test for job. Feels very fatigued.Marland Kitchen  History of Present Illness: Marie Perez is a 35 year old female who presents today with chief complaint of fatigue.  1) Fatigue-Feels very fatigued.  Has started a new job last month.   Works in the lab as a Soil scientist.  Patient notes that her daughter was recently ill with a GI virus and didn't sleep well during that time.    2)Clear Vaginal discharge- Tried Monistat about a month ago.  Symptoms initially resolved with this treatment.  Prior to menses discharged worsened.  Denies abnormal discharge at this point.  Period ended yesterday- spotting a litte at this point.  Also notes frequent urination.    3)HTN-  notes that she took her labetalol, "on my way in here this morning."  Allergies (verified): No Known Drug Allergies  Past History:  Past Medical History: Last updated: 10/31/2008 Hypertension  Past Surgical History: Last updated: 10/31/2008 Caesarean  section 01/2008  Family History: Reviewed history from 12/04/2009 and no changes required. Mom- living HTN Dad- living, DM Brother- younger alive and well Daughter 1/10- alive and well, (ear tubes)  Social History: Reviewed history from 12/04/2009 and no changes required. Occupation: Archivist Married 6 years  1 daughter 8 months never smoked No alcohol No drugs  Review of Systems       see HPI  Physical Exam  General:  Well-developed,well-nourished,in no acute distress; alert,appropriate and cooperative throughout examination Lungs:  Normal respiratory effort, chest expands symmetrically. Lungs are clear to auscultation, no crackles or wheezes. Heart:  Normal rate and regular rhythm. S1 and S2 normal without gallop, murmur, click, rub or other extra sounds. Abdomen:  Bowel sounds positive,abdomen soft and non-tender without masses, organomegaly or hernias noted. Genitalia:  blood tinged vaginal discharge noted. Psych:  Cognition and judgment appear intact. Alert and cooperative with normal attention span and concentration. No apparent delusions, illusions, hallucinations   Impression & Recommendations:  Problem # 1:  FATIGUE (ICD-780.79) Assessment New Fatigue may be related to stress of new job.  Will check CBC and monospot.  TSH was performed 3 months ago and was normal. Pt educated on sleep hygiene.   Orders: TLB-CBC Platelet - w/Differential (85025-CBCD) Monospot-[facility] (57846)  Problem # 2:  VAGINITIS (ICD-616.10) Assessment:  New Recent vaginal discharge, wet prep performed today.  UA is negative.   Problem # 3:  HYPERTENSION (ICD-401.9) Assessment: Comment Only BP recheck difficult to hear, but 135/85.  Will plan to repeat BP in 2 days when she returns for her PPD.  If still elevated, will resume HCTZ. Her updated medication list for this problem includes:    Labetalol Hcl 200 Mg Tabs (Labetalol hcl) ..... One tablet by mouth two times a day  BP today:  144/100 Prior BP: 146/90 (12/04/2009)  Labs Reviewed: K+: 4.3 (11/26/2009) Creat: : 1.05 (11/26/2009)   Chol: 195 (11/01/2008)   HDL: 40 (11/01/2008)   LDL: 134 (11/01/2008)   TG: 105 (11/01/2008)  Complete Medication List: 1)  Labetalol Hcl 200 Mg Tabs (Labetalol hcl) .... One tablet by mouth two times a day  Other Orders: UA Dipstick w/o Micro (manual) (04540) Specimen Handling (99000) T-Wet Prep by Molecular Probe [phone])  Patient Instructions: 1)  Please complete your blood work on the first floor. 2)  Follow up in 1 month.   Orders Added: 1)  UA Dipstick w/o Micro (manual) [81002] 2)  TLB-CBC Platelet - w/Differential [85025-CBCD] 3)  Monospot-[facility] [95621] 4)  Specimen Handling [99000] 5)  T-Wet Prep by Molecular Probe [[id number]] 6)  Est. Patient Level III [62952]    Current Allergies (reviewed today): No known allergies    Laboratory Results   Urine Tests   Date/Time Reported: Mervin Kung CMA (AAMA)  February 27, 2010 8:43 AM   Routine Urinalysis   Color: dark yellow Appearance: Clear Glucose: negative   (Normal Range: Negative) Bilirubin: negative   (Normal Range: Negative) Ketone: negative   (Normal Range: Negative) Spec. Gravity: 1.020   (Normal Range: 1.003-1.035) Blood: moderate   (Normal Range: Negative) pH: 6.5   (Normal Range: 5.0-8.0) Protein: negative   (Normal Range: Negative) Urobilinogen: 0.2   (Normal Range: 0-1) Nitrite: negative   (Normal Range: Negative) Leukocyte Esterace: negative   (Normal Range: Negative)    Comments: Pt on menses.     Appended Document: Vaginal discharge/  TB skin/hea--rm 5    Clinical Lists Changes  Orders: Added new Service order of TB Skin Test [phone]) - Signed Added new Service order of Admin 1st Vaccine (44010) - Signed Observations: Added new observation of TB-PPD LOT#: [id number] (02/27/2010 9:18) Added new observation of TB-PPD EXP: 07/27/2011 (02/27/2010 9:18) Added new  observation of TB-PPD BY: Mervin Kung CMA (AAMA) (02/27/2010 9:18) Added new observation of TB-PPD RTE: ID (02/27/2010 9:18) Added new observation of TB-PPD DSE: 0.1 ml (02/27/2010 9:18) Added new observation of TB-PPD MFR: Sanofi Pasteur (02/27/2010 9:18) Added new observation of TB-PPD SITE: right forearm (02/27/2010 9:18) Added new observation of TB-PPD: PPD (02/27/2010 9:18)       Immunizations Administered:  PPD Skin Test:    Vaccine Type: PPD    Site: right forearm    Mfr: Sanofi Pasteur    Dose: 0.1 ml    Route: ID    Given by: Mervin Kung CMA (AAMA)    Exp. Date: 07/27/2011    Lot #: [id number]

## 2010-03-29 ENCOUNTER — Ambulatory Visit: Payer: BC Managed Care – PPO | Admitting: Family

## 2010-04-05 ENCOUNTER — Ambulatory Visit (INDEPENDENT_AMBULATORY_CARE_PROVIDER_SITE_OTHER): Payer: BC Managed Care – PPO | Admitting: Family

## 2010-04-05 ENCOUNTER — Encounter: Payer: Self-pay | Admitting: Family

## 2010-04-05 DIAGNOSIS — R5383 Other fatigue: Secondary | ICD-10-CM

## 2010-04-05 DIAGNOSIS — I10 Essential (primary) hypertension: Secondary | ICD-10-CM

## 2010-04-05 DIAGNOSIS — G5600 Carpal tunnel syndrome, unspecified upper limb: Secondary | ICD-10-CM

## 2010-04-05 DIAGNOSIS — E669 Obesity, unspecified: Secondary | ICD-10-CM

## 2010-04-05 LAB — CONVERTED CEMR LAB: TSH: 0.888 microintl units/mL (ref 0.350–4.500)

## 2010-04-06 ENCOUNTER — Encounter: Payer: Self-pay | Admitting: Family

## 2010-04-09 NOTE — Letter (Addendum)
   [place] [facility] [address] [place], [place]  [address]  [place] Phone: [phone]      April 06, 2010   [facility] [address] [place], [place] [address]  RE:  LAB RESULTS  Dear  Ms. Quale,  The following is an interpretation of your most recent lab tests.  Please take note of any instructions provided or changes to medications that have resulted from your lab work.   THYROID STUDIES:  Thyroid studies normal TSH: 0.888      Sincerely Yours,    Lemont Fillers FNP  Appended Document:  Mailed.

## 2010-04-09 NOTE — Assessment & Plan Note (Signed)
Summary: 1 month follow up/mhf--rm 5   Vital Signs:  Patient profile:   35 year old female Menstrual status:  irregular Height:      66 inches Weight:      242.50 pounds BMI:     39.28 Temp:     98.1 degrees F oral Pulse rate:   84 / minute Pulse rhythm:   regular Resp:     16 per minute BP sitting:   142 / 90  (right arm) Cuff size:   large  Vitals Entered By: Mervin Kung CMA Duncan Dull) (April 05, 2010 8:08 AM) CC: Pt here for 1 month f/u. Concerned about weight gain despite dieting and exercise. Is Patient Diabetic? No Pain Assessment Patient in pain? no      Comments Pt completed Metrogel.    Primary Care Provider:  Lemont Fillers FNP  CC:  Pt here for 1 month f/u. Concerned about weight gain despite dieting and exercise.Marland Kitchen  History of Present Illness: Marie Perez is a 35 year old female who presents today for follow up.    1) HTN- reports + compliance with meds.     2) Weight gain- stopped fried food, eating 3 meals a day, fruits/veggies. Just joined the gym.    3) Fatigue- unchanged.  Notes that she does wake up several times a night due to her 9 year old daughter.  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  + hand numbness while sleeping.  Preventive Screening-Counseling & Management  Alcohol-Tobacco     Alcohol drinks/day: 0     Alcohol Counseling: not indicated; patient does not drink     Smoking Status: never     Tobacco Counseling: not indicated; no tobacco use  Allergies (verified): No Known Drug Allergies  Past History:  Past Medical History: Last updated: 10/31/2008 Hypertension  Past Surgical History: Last updated: 10/31/2008 Caesarean section 01/2008  Review of Systems       see HPI,  General:  Complains of fatigue. Neuro:  Complains of tingling; tingling R hand.  Physical Exam  General:  Overweight AA female, awake, alert,  NAD Head:  Normocephalic and atraumatic without obvious abnormalities. No apparent alopecia or balding. Lungs:  Normal respiratory effort, chest expands symmetrically. Lungs are clear to auscultation, no crackles or wheezes. Heart:  Normal rate and regular rhythm. S1 and S2 normal without gallop, murmur, click, rub or other extra sounds. Extremities:  No peripheral edema Skin:  No lesions or rashes noted.  Psych:  Cognition and judgment appear intact. Alert and cooperative with normal attention span and concentration. No apparent delusions, illusions, hallucinations   Impression & Recommendations:  Problem # 1:  HYPERTENSION (ICD-401.9) Assessment Unchanged  Will resume HCTZ and potassium supplement.   Her updated medication list for this problem includes:    Labetalol Hcl 200 Mg Tabs (Labetalol hcl) ..... One tablet by mouth two times a day    Hydrochlorothiazide 25 Mg Tabs (Hydrochlorothiazide) ..... One tablet by mouth daily  BP today: 142/90 Prior BP: 144/100 (02/27/2010)  Labs Reviewed: K+: 4.3 (11/26/2009) Creat: : 1.05 (11/26/2009)   Chol: 195 (11/01/2008)   HDL: 40 (11/01/2008)   LDL: 134 (11/01/2008)   TG: 105 (11/01/2008)  Problem # 2:  OBESITY, UNSPECIFIED (ICD-278.00) Assessment: Deteriorated Patient has gained 3 pounds since her last visit. Ht: 66 (04/05/2010)   Wt: 242.50 (04/05/2010)   BMI: 39.28 (04/05/2010)  Problem #  3:  FATIGUE (ICD-780.79) Assessment: Unchanged Epsworth Sleepiness scale performed today= 9.  Will plan referral for a sleep study to rule out OSA.   Orders: Sleep Study (Sleep Study)  Problem # 4:  CARPAL TUNNEL SYNDROME, RIGHT (ICD-354.0) Assessment: Deteriorated Hx of same during pregnancy.  Recommended that patient use wrist brace at night and use ibuprofen as needed when symptoms are severe.    Complete Medication List: 1)  Labetalol Hcl 200 Mg Tabs (Labetalol hcl) .... One tablet by mouth two times a day 2)  Hydrochlorothiazide 25 Mg  Tabs (Hydrochlorothiazide) .... One tablet by mouth daily 3)  Potassium Chloride Cr 10 Meq Cr-caps (Potassium chloride) .... One tab by mouth once daily  Other Orders: T-TSH ([id number])  Patient Instructions: 1)  Try to keep calories to 1200-1500 calories a day.   2)  www.sparkpeople.com 3)  It is important that you exercise regularly at least 20 minutes 5 times a week. If you develop chest pain, have severe difficulty breathing, or feel very tired , stop exercising immediately and seek medical attention. 4)  Wear your wrist brace to bed.   5)  Follow up in 1 month.   Prescriptions: POTASSIUM CHLORIDE CR 10 MEQ CR-CAPS (POTASSIUM CHLORIDE) one tab by mouth once daily  #30 x 1   Entered and Authorized by:   Lemont Fillers FNP   Signed by:   Lemont Fillers FNP on 04/05/2010   Method used:   Electronically to        [facility] [address] [phone]* (retail)       [address] [place], [place]  [address]       Ph: [phone]       Fax: [phone]   RxID:   [id number] HYDROCHLOROTHIAZIDE 25 MG TABS (HYDROCHLOROTHIAZIDE) one tablet by mouth daily  #30 x 2   Entered and Authorized by:   Lemont Fillers FNP   Signed by:   Lemont Fillers FNP on 04/05/2010   Method used:   Electronically to        [facility] [address] [phone]* (retail)       [address] [place], [place]  [address]       Ph: [phone]       Fax: [phone]   RxID:   [id number]    Orders Added: 1)  Sleep Study [Sleep Study] 2)  T-TSH [[id number]] 3)  Est. Patient Level IV [70623]    Current Allergies (reviewed today): No known allergies

## 2010-04-30 ENCOUNTER — Encounter: Payer: Self-pay | Admitting: Family

## 2010-05-01 ENCOUNTER — Encounter: Payer: Self-pay | Admitting: Family

## 2010-05-01 ENCOUNTER — Ambulatory Visit (INDEPENDENT_AMBULATORY_CARE_PROVIDER_SITE_OTHER): Payer: BC Managed Care – PPO | Admitting: Family

## 2010-05-01 VITALS — BP 126/88 | HR 72 | Temp 98.3°F | Resp 18 | Ht 65.98 in | Wt 244.0 lb

## 2010-05-01 DIAGNOSIS — J329 Chronic sinusitis, unspecified: Secondary | ICD-10-CM | POA: Insufficient documentation

## 2010-05-01 MED ORDER — AMOXICILLIN 875 MG PO TABS: 875.0000 mg | ORAL_TABLET | Freq: Two times a day (BID) | ORAL | Status: AC

## 2010-05-01 MED ORDER — FLUTICASONE PROPIONATE 50 MCG/ACT NA SUSP: 2.0000 | Freq: Every day | NASAL | Status: DC

## 2010-05-01 NOTE — Assessment & Plan Note (Signed)
Symptoms consistent with sinusitus in setting of allergic rhinitis.  Switch zyrtec to claritin due to drowsiness.  Add flonase + 10 days of amoxicillin.

## 2010-05-01 NOTE — Progress Notes (Signed)
  Subjective:    Patient ID: Marie Perez, female    DOB: 1975-12-28, 35 y.o.   MRN: [id number]  HPIShe has tried zyrtec with mi  Ms.  Perez is a 35 yr old female who presents with chief complaint of sinus congestion/pressure.  Symptoms started last Wednesday.  She missed work on Thursday/Friday of last week.  Denies associated fever but notes mild cough and sore throat.  Tried Zyrtec with minimal relief.  Nasal discharge is clear.  Energy has been poor.      Review of Systems See HPI    Past Medical History  Diagnosis Date  . Hypertension     History   Social History  . Marital Status: Married    Spouse Name: N/A    Number of Children: 1  . Years of Education: N/A   Occupational History  . Rehab tech    Social History Main Topics  . Smoking status: Never Smoker   . Smokeless tobacco: Never Used  . Alcohol Use: No  . Drug Use: No  . Sexually Active: Not on file   Other Topics Concern  . Not on file   Social History Narrative  . No narrative on file    Past Surgical History  Procedure Date  . Cesarean section 01/2008    Family History  Problem Relation Age of Onset  . Hypertension Mother   . Diabetes Father     No Known Allergies  Current Outpatient Prescriptions on File Prior to Visit  Medication Sig Dispense Refill  . hydrochlorothiazide 25 MG tablet Take 25 mg by mouth daily.        Marland Kitchen labetalol (NORMODYNE) 200 MG tablet Take 200 mg by mouth 2 (two) times daily.        . potassium chloride (K-DUR) 10 MEQ tablet Take 10 mEq by mouth daily.          BP 126/88  Pulse 72  Temp(Src) 98.3 F (36.8 C) (Oral)  Resp 18  Ht 5' 5.98" (1.676 m)  Wt 244 lb (110.678 kg)  BMI 39.40 kg/m2    Objective:   Physical Exam  Constitutional: She appears well-developed and well-nourished.  HENT:  Head: Normocephalic and atraumatic.  Right Ear: Ear canal normal.  Left Ear: Ear canal normal.       + frontal and maxillary sinus tenderness to palpation.  Eyes:  Conjunctivae are normal. Pupils are equal, round, and reactive to light.  Neck: Normal range of motion. Neck supple.  Cardiovascular: Normal rate and regular rhythm.   Pulmonary/Chest: Effort normal and breath sounds normal.          Assessment & Plan:

## 2010-05-01 NOTE — Patient Instructions (Signed)

## 2010-05-06 LAB — COMPREHENSIVE METABOLIC PANEL
ALT: 19 U/L (ref 0–35)
ALT: 12 U/L (ref 0–35)
ALT: 14 U/L (ref 0–35)
AST: 28 U/L (ref 0–37)
Albumin: 2.8 g/dL — ABNORMAL LOW (ref 3.5–5.2)
Albumin: 2.4 g/dL — ABNORMAL LOW (ref 3.5–5.2)
Albumin: 2.8 g/dL — ABNORMAL LOW (ref 3.5–5.2)
Albumin: 2.9 g/dL — ABNORMAL LOW (ref 3.5–5.2)
Alkaline Phosphatase: 85 U/L (ref 39–117)
Alkaline Phosphatase: 75 U/L (ref 39–117)
Alkaline Phosphatase: 83 U/L (ref 39–117)
Alkaline Phosphatase: 95 U/L (ref 39–117)
BUN: 6 mg/dL (ref 6–23)
BUN: 8 mg/dL (ref 6–23)
Calcium: 9.1 mg/dL (ref 8.4–10.5)
Chloride: 104 mEq/L (ref 96–112)
Chloride: 109 mEq/L (ref 96–112)
Creatinine, Ser: 0.97 mg/dL (ref 0.4–1.2)
GFR calc Af Amer: >60 mL/min (ref >60)
Glucose, Bld: 77 mg/dL (ref 70–99)
Glucose, Bld: 105 mg/dL — ABNORMAL HIGH (ref 70–99)
Glucose, Bld: 99 mg/dL (ref 70–99)
Potassium: 3.6 mEq/L (ref 3.5–5.1)
Potassium: 4 mEq/L (ref 3.5–5.1)
Potassium: 4.1 mEq/L (ref 3.5–5.1)
Potassium: 4.4 mEq/L (ref 3.5–5.1)
Sodium: 137 mEq/L (ref 135–145)
Sodium: 133 mEq/L — ABNORMAL LOW (ref 135–145)
Sodium: 139 mEq/L (ref 135–145)
Total Bilirubin: 0.2 mg/dL — ABNORMAL LOW (ref 0.3–1.2)
Total Bilirubin: 0.3 mg/dL (ref 0.3–1.2)
Total Bilirubin: 0.8 mg/dL (ref 0.3–1.2)
Total Protein: 6.4 g/dL (ref 6.0–8.3)
Total Protein: 5.8 g/dL — ABNORMAL LOW (ref 6.0–8.3)
Total Protein: 6.2 g/dL (ref 6.0–8.3)

## 2010-05-06 LAB — CBC
HCT: 36 % (ref 36.0–46.0)
HCT: 37.9 % (ref 36.0–46.0)
HCT: 39 % (ref 36.0–46.0)
HCT: 39.7 % (ref 36.0–46.0)
Hemoglobin: 12.4 g/dL (ref 12.0–15.0)
Hemoglobin: 12.3 g/dL (ref 12.0–15.0)
Hemoglobin: 13 g/dL (ref 12.0–15.0)
MCHC: 33.6 g/dL (ref 30.0–36.0)
MCHC: 34.2 g/dL (ref 30.0–36.0)
MCV: 98.3 fL (ref 78.0–100.0)
MCV: 98.7 fL (ref 78.0–100.0)
MCV: 98.9 fL (ref 78.0–100.0)
Platelets: 188 10*3/uL (ref 150–400)
Platelets: 202 10*3/uL (ref 150–400)
RBC: 3.84 MIL/uL — ABNORMAL LOW (ref 3.87–5.11)
RBC: 4.01 MIL/uL (ref 3.87–5.11)
RDW: 13.8 % (ref 11.5–15.5)
RDW: 13.9 % (ref 11.5–15.5)
RDW: 13.9 % (ref 11.5–15.5)
WBC: 11.1 10*3/uL — ABNORMAL HIGH (ref 4.0–10.5)
WBC: 11.9 10*3/uL — ABNORMAL HIGH (ref 4.0–10.5)
WBC: 13 10*3/uL — ABNORMAL HIGH (ref 4.0–10.5)

## 2010-05-06 LAB — LACTATE DEHYDROGENASE
LDH: 138 U/L (ref 94–250)
LDH: 129 U/L (ref 94–250)
LDH: 167 U/L (ref 94–250)

## 2010-05-06 LAB — PROTEIN, URINE, 24 HOUR
Collection Interval-UPROT: 24 hours
Protein, 24H Urine: 498 mg/d — ABNORMAL HIGH (ref 50–100)
Protein, Urine: 24 mg/dL

## 2010-05-06 LAB — URIC ACID
Uric Acid, Serum: 4.7 mg/dL (ref 2.4–7.0)
Uric Acid, Serum: 5 mg/dL (ref 2.4–7.0)

## 2010-05-06 LAB — MAGNESIUM: Magnesium: 5.5 mg/dL — ABNORMAL HIGH (ref 1.5–2.5)

## 2010-05-06 LAB — CREATININE CLEARANCE, URINE, 24 HOUR
Collection Interval-CRCL: 24 hours
Creatinine, Urine: 113.2 mg/dL
Urine Total Volume-CRCL: 2075 mL

## 2010-05-08 ENCOUNTER — Ambulatory Visit: Payer: BC Managed Care – PPO | Admitting: Family

## 2010-05-09 ENCOUNTER — Encounter (HOSPITAL_BASED_OUTPATIENT_CLINIC_OR_DEPARTMENT_OTHER): Payer: BC Managed Care – PPO

## 2010-06-04 NOTE — Discharge Summary (Signed)
NAME:  Marie Perez, Marie Perez NO.:  [id number]   MEDICAL RECORD NO.:  [id number]          PATIENT TYPE:  INP   LOCATION:  9320                          FACILITY:  [facility]   PHYSICIAN:  Maxie Better, M.D.DATE OF BIRTH:  [birth year]   DATE OF ADMISSION:  02/11/2008  DATE OF DISCHARGE:  02/15/2008                               DISCHARGE SUMMARY   ADMISSION DIAGNOSES:  Twin gestation, intrauterine fetal demise of  second twin, nonreassuring fetal tracing, and superimposed preeclampsia.   DISCHARGE DIAGNOSES:  Twin gestation at 34-1/7 weeks, second twin  intrauterine fetal demise, nonreassuring fetal tracing, superimposed  preeclampsia, and pelvic adhesions.   PROCEDURES:  Primary cesarean section, Kerr hysterotomy, and lysis of  adhesions.   HISTORY OF PRESENT ILLNESS:  A 35 year old gravida 2, para 0-0-1-0  female at 34-1/7 weeks with known chronic hypertension and previous twin  gestation, subsequently had a loss with the second twin fetal demise  early in the pregnancy with severe IUGR, who was now at 34-1/7 weeks  admitted for management of chronic hypertension with probable  superimposed preeclampsia, and a biophysical profile of 4/10.  Estimated  fetal weight is 4 pounds 2 ounces of the twin that live.  The last 24-  hour urine collection had 494 mg of protein.  The patient was steroid  completed.  Her admission blood pressure is 148/100.  The patient had  lability of her blood pressures over the last several weeks with her  blood pressure medications been increasing and blood pressure being  recalcitrant to management.   HOSPITAL COURSE:  The patient was admitted to start on magnesium sulfate  and penicillin.  The patient denied any headache, visual change, or  epigastric pain.  She was continued on her Aldomet and labetalol.  Pitocin was started.  NICU consultation was obtained.  PIH labs were  obtained.  Uric acid of 4.7, ALT of 12, and AST of 31.  Platelet  count  of 188,000, hematocrit of 39, and white count 11.9.  The patient was  placed on continuous monitoring.  She has 1+ edema.  Blood pressure  183/108.  IV labetalol was also given.  Artificial rupture of membranes  was performed when the patient was 1, 60-70% and high.  Variable  decelerations was noted.  The tracing showed lack of variability.  Magnesium level 4.5, which did not attribute to the flat tracing.  Decision was made to proceed with a primary cesarean section.  Primary C-  section was performed, live infant female, 4 pounds 5 ounces, Apgars of  9 and 9 was delivered, then nonviable intrauterine fetal demise was  delivered, intact left tube, fimbriated end was found to be not  identified whereas the right tube with paratubal adhesions, lysis was  done.  Normal ovaries were noted bilaterally.  Postoperative, the  patient was continued on her labetalol and Aldomet, as well as magnesium  sulfate.  She was continued on magnesium sulfate until she started to  diurese well.  Aldomet has had to be discontinued given the patient's  extremity pedal edema and hydrochlorothiazide was added.  Her blood  pressures on postop day #3, 142/93, 131/81, and 146/88.  Hydrochlorothiazide was added and labetalol was continued at 300 mg 3  times a day.  Significant improvement in her pedal edema was noted.  She  had one blood pressure of 166/110.  Overall improved blood pressures is  147/88, 138/81, 155/92.  On January 26 the patient was stable to  discharge home.   DISPOSITION:  Home.   CONDITION:  Stable.   DISCHARGE MEDICATIONS:  1. Hydrochlorothiazide 50 mg p.o. daily.  2. Labetalol 300 mg p.o. t.i.d.  3. Ibuprofen 800 mg 1 tablet every 6-8 hours p.r.n. pain.  4. Percocet 1-2 tablets 4-6 hours p.r.n. pain.   FOLLOWUP APPOINTMENT:  At [facility] for staple removal, there is a  blood pressure check at the time of her staple removal in 6 weeks  postpartum.   DISCHARGE  INSTRUCTIONS:  Per the postpartum booklet, reiterating the  preeclampsia warning signs.      Maxie Better, M.D.  Electronically Signed     [place]/MEDQ  D:  03/12/2008  T:  03/12/2008  Job:  [id number]

## 2010-06-04 NOTE — H&P (Signed)
NAME:  Marie Perez, AUGELLO NO.:  [id number]   MEDICAL RECORD NO.:  [id number]        PATIENT TYPE:  WMAT   LOCATION:                                F[facility]ILITY:  [facility]   PHYSICIAN:  Lenoard Aden, M.D.DATE OF BIRTH:  December 10, 1975   DATE OF ADMISSION:  07/30/2007  DATE OF DISCHARGE:                              HISTORY & PHYSICAL   CHIEF COMPLAINT:  Bleeding.   HISTORY OF PRESENT ILLNESS:  She is a 35 year old African American  female G1, P0, with a history of tubal factor infertility, status post  IVF on July 08, 2007, who presents with increased bleeding tonight.   MEDICATIONS:  Labetalol and prenatal vitamin.   SOCIAL HISTORY:  She is a nonsmoker, nondrinker.  Denies domestic or  physical violence.   FAMILY HISTORY:  She has a family history of cancer, hypertension, and  thyroid disease.  She has a personal history of infertility as noted and  bacterial vaginosis.   PHYSICAL EXAMINATION:  GENERAL:  She is a well-developed, well-nourished  [place] female.  VITAL SIGNS:  Blood pressure 110/70.  HEENT:  Normal.  LUNGS:  Clear.  HEART:  Regular rate and rhythm.  ABDOMEN:  Soft and nontender.  PELVIC:  Reveals an enlarged uterus.  Cervix is closed.  Rectal bleeding  noted.  EXTREMITIES:  No cords.  NEUROLOGIC:  Nonfocal.  SKIN:  Intact.   IMPRESSION:  1. Viable 6-week intrauterine twin gestation, dichorionic-diamniotic      in vitro fertilization  2. Threatened abortion with fetal __________   PLAN:  __________  Rhogam as needed.   Her lab results, which are pending today.      Lenoard Aden, M.D.  Electronically Signed     RJT/MEDQ  D:  07/31/2007  T:  08/01/2007  Job:  [id number]

## 2010-06-04 NOTE — Op Note (Signed)
NAME:  Marie Perez, Marie Perez NO.:  [id number]   MEDICAL RECORD NO.:  [id number]          PATIENT TYPE:  INP   LOCATION:  9374                          [facility]ACILITY:  [facility]   PHYSICIAN:  Maxie Better, M.D.DATE OF BIRTH:  August 31, 1975   DATE OF PROCEDURE:  02/11/2008  DATE OF DISCHARGE:                               OPERATIVE REPORT   PREOPERATIVE DIAGNOSES:  Nonreassuring fetal tracing, chronic  hypertension with superimposed preeclampsia, twin gestation, second twin  intrauterine fetal demise, and intrauterine gestation at 29 and 1/7  weeks.   PROCEDURE:  Primary cesarean section, Kerr hysterotomy, lysis of  adhesions.   POSTOPERATIVE DIAGNOSES:  Nonreassuring fetal tracing, intrauterine  gestation at 40 and 1/7 weeks, twin gestation, second twin intrauterine  fetal demise, and chronic hypertension with superimposed preeclampsia,  pelvic adhesions.   ANESTHESIA:  Spinal.   SURGEON:  Maxie Better, MD   ASSISTANT:  Alphonsus Sias. Ernestina Penna, MD   INDICATIONS:  A 35 year old gravida 2, para 0-0-1-0, married black  female at 82 and 1/7 weeks with known twin gestation, though the second  twin fetal demise a month ago with underlying chronic hypertension and  superimposed preeclampsia, who was admitted for evaluation and  management after biophysical profile of 4/10 on testing.  The patient  has had a previous 24-hour urine total protein collection last Saturday,  which had been 494 mg.  The patient's blood pressure had been managed  with labetalol until  about a week ago, at which time Aldomet was  subsequently added.  On presentation today in the office, her blood  pressures were 148/102 and 140/100 in addition to the nonreassuring  fetal testing.  The patient was admitted.  Maternal Fetal Medicine  consultation was obtained with recommendation for delivery.  The patient  was started on magnesium sulfate.  The patient had been betamethasone  completed previously.   Pitocin was started.  Penicillin was started for  unknown group B strep status.  The patient underwent artificial rupture  of membranes.  Internal fetal scalp electrode was placed.  A flat  tracing with some variables and some possible early decelerations were  noted.  Given the nonimminent delivery and the tracing, decision was  made to proceed with a primary cesarean section.  The patient had  already received one dose of IV labetalol for blood pressure diastolic  at 115.  Consent was signed after reviewing the risks, and she was  transferred to the operating room.   PROCEDURE:  Under adequate spinal anesthesia, the patient was placed in  the supine position with a left lateral tilt.  An indwelling Foley  catheter was already in place.  The patient was sterilely prepped and  draped in the usual fashion.  A 0.25% Marcaine was injected along the  planned Pfannenstiel skin incision site.  Pfannenstiel skin incision was  then made, carried down to the rectus fascia.  Rectus fascia was then  opened transversely.  Rectus fascia was then bluntly and sharply  dissected off the rectus muscle in a superior and inferior fashion.  The  rectus muscles were split in midline.  The  parietal peritoneum was  entered sharply and extended.  The vesicouterine peritoneum was opened  transversely.  The bladder was then bluntly dissected off the lower  uterine segment and displaced inferiorly.  Curvilinear low transverse  uterine incision was then made and extended with bandage scissors.  Subsequent delivery of a live female was accomplished.  Baby was bulb  suctioned.  The abdominal cord was clamped and cut.  The baby was  transferred to the awaiting pediatrician with Apgars of 9 at 9 and 1 at  5 minutes.  Exploration of the uterine cavity was notable for the other  nonviable fetus in the right upper quadrant of the uterine cavity.  The  placenta and the second fetus in its intact sac was removed in its   entirety, and the sac was then subsequently opened on the abdomen, and  the fetal demise twin was then removed.  The uterine cavity was cleaned  of debris.  Uterus was exteriorized.  Uterine incision in extension was  closed in two layers, the first layer was 0 Monocryl running locked  stitch, second layer was imbricating 0 Monocryl sutures.  It was then  noted there was filmy adhesions, peritubal and periovarian on the right,  which was then lysed.  The left tube distal end did not reveal the  fimbriated end at all, was blunted.  An attempt to identify where the  fimbriated end could be was not possible, and therefore, it was left  alone.  The ovaries on both sides were normal.  Uterus was then returned  back to the abdomen after the abdomen was irrigated and suctioned of  debris.  Incision showed good hemostasis.  The parietal peritoneum was  then closed with 2-0 Vicryl.  The rectus fascia was 0 Vicryl x2.  The  subcutaneous areas irrigated, small bleeders cauterized, interrupted 2-0  plain sutures placed, and the skin approximated with Ethicon staples.  The specimen was the placenta and the second twin sent to Pathology.  Estimated blood loss was 400 mL.  Urine output was 100 mL of clear  yellow urine.  Intraoperative fluid 1800 mL.  Sponge and instrument  counts x2 were correct.  Complication was none.  Weight of the baby was  4 pounds 5 ounces.  Baby was transferred to the NICU.  The patient was  transferred to the recovery room in stable condition.      Maxie Better, M.D.  Electronically Signed     [place]/MEDQ  D:  02/11/2008  T:  02/12/2008  Job:  [id number]

## 2010-10-13 ENCOUNTER — Other Ambulatory Visit: Payer: Self-pay | Admitting: Family

## 2010-10-14 MED ORDER — LABETALOL HCL 200 MG PO TABS: 200.0000 mg | ORAL_TABLET | Freq: Two times a day (BID) | ORAL | Status: DC

## 2010-10-14 NOTE — Telephone Encounter (Signed)
Received request from pt to send refill to [facility] at [address] as she has moved.  Refill sent, pt notified.

## 2010-10-17 LAB — HCG, QUANTITATIVE, PREGNANCY: hCG, Beta Chain, Quant, S: 38485 — ABNORMAL HIGH

## 2010-12-23 ENCOUNTER — Emergency Department (INDEPENDENT_AMBULATORY_CARE_PROVIDER_SITE_OTHER): Payer: No Typology Code available for payment source

## 2010-12-23 ENCOUNTER — Emergency Department (HOSPITAL_BASED_OUTPATIENT_CLINIC_OR_DEPARTMENT_OTHER)
Admission: EM | Admit: 2010-12-23 | Discharge: 2010-12-24 | Disposition: A | Discharge status: Home / Self Care | Payer: No Typology Code available for payment source | Attending: Emergency Medicine | Admitting: Emergency Medicine

## 2010-12-23 ENCOUNTER — Encounter (HOSPITAL_BASED_OUTPATIENT_CLINIC_OR_DEPARTMENT_OTHER): Payer: Self-pay | Admitting: *Deleted

## 2010-12-23 DIAGNOSIS — M259 Joint disorder, unspecified: Secondary | ICD-10-CM

## 2010-12-23 DIAGNOSIS — M545 Low back pain: Secondary | ICD-10-CM

## 2010-12-23 DIAGNOSIS — M25519 Pain in unspecified shoulder: Secondary | ICD-10-CM | POA: Insufficient documentation

## 2010-12-23 DIAGNOSIS — M549 Dorsalgia, unspecified: Secondary | ICD-10-CM

## 2010-12-23 DIAGNOSIS — I10 Essential (primary) hypertension: Secondary | ICD-10-CM | POA: Insufficient documentation

## 2010-12-23 DIAGNOSIS — Y92410 Unspecified street and highway as the place of occurrence of the external cause: Secondary | ICD-10-CM | POA: Insufficient documentation

## 2010-12-23 MED ORDER — IBUPROFEN 800 MG PO TABS: 800.0000 mg | ORAL_TABLET | Freq: Three times a day (TID) | ORAL | Status: AC
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
## 2010-12-24 ENCOUNTER — Ambulatory Visit (INDEPENDENT_AMBULATORY_CARE_PROVIDER_SITE_OTHER): Payer: BC Managed Care – PPO | Admitting: Family

## 2010-12-24 ENCOUNTER — Encounter: Payer: Self-pay | Admitting: Family

## 2010-12-24 VITALS — BP 160/120 | HR 97 | Temp 98.6°F | Ht 66.0 in | Wt 243.0 lb

## 2010-12-24 DIAGNOSIS — M7918 Myalgia, other site: Secondary | ICD-10-CM | POA: Insufficient documentation

## 2010-12-24 DIAGNOSIS — IMO0001 Reserved for inherently not codable concepts without codable children: Secondary | ICD-10-CM

## 2010-12-24 DIAGNOSIS — I10 Essential (primary) hypertension: Secondary | ICD-10-CM

## 2010-12-24 MED ORDER — AMLODIPINE BESYLATE 5 MG PO TABS: 5.0000 mg | ORAL_TABLET | Freq: Every day | ORAL | Status: DC
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
## 2010-12-24 NOTE — Assessment & Plan Note (Addendum)
BP Readings from Last 3 Encounters:  12/24/10 160/120  12/23/10 187/100  05/01/10 126/88  Pt took AM medication today.  Deteriorated.  Add amlodipine- pt to follow up in 2-3 weeks.

## 2010-12-24 NOTE — Patient Instructions (Signed)
Please call if your soreness worsens or if it does not continue to improve.  Follow up in 2 weeks.
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
## 2011-01-07 ENCOUNTER — Ambulatory Visit: Payer: BC Managed Care – PPO | Admitting: Family

## 2011-01-10 ENCOUNTER — Ambulatory Visit: Payer: BC Managed Care – PPO | Admitting: Family

## 2011-01-20 ENCOUNTER — Ambulatory Visit: Payer: BC Managed Care – PPO | Admitting: Family

## 2011-01-20 DIAGNOSIS — Z0289 Encounter for other administrative examinations: Secondary | ICD-10-CM

## 2012-06-21 ENCOUNTER — Emergency Department (HOSPITAL_BASED_OUTPATIENT_CLINIC_OR_DEPARTMENT_OTHER)
Admission: EM | Admit: 2012-06-21 | Discharge: 2012-06-21 | Disposition: A | Discharge status: Home / Self Care | Payer: BC Managed Care – PPO | Attending: Emergency Medicine | Admitting: Emergency Medicine

## 2012-06-21 ENCOUNTER — Encounter (HOSPITAL_BASED_OUTPATIENT_CLINIC_OR_DEPARTMENT_OTHER): Payer: Self-pay | Admitting: Emergency Medicine

## 2012-06-21 DIAGNOSIS — Z79899 Other long term (current) drug therapy: Secondary | ICD-10-CM | POA: Insufficient documentation

## 2012-06-21 DIAGNOSIS — Y939 Activity, unspecified: Secondary | ICD-10-CM | POA: Insufficient documentation

## 2012-06-21 DIAGNOSIS — L089 Local infection of the skin and subcutaneous tissue, unspecified: Secondary | ICD-10-CM | POA: Insufficient documentation

## 2012-06-21 DIAGNOSIS — Y929 Unspecified place or not applicable: Secondary | ICD-10-CM | POA: Insufficient documentation

## 2012-06-21 DIAGNOSIS — IMO0002 Reserved for concepts with insufficient information to code with codable children: Secondary | ICD-10-CM | POA: Insufficient documentation

## 2012-06-21 DIAGNOSIS — I10 Essential (primary) hypertension: Secondary | ICD-10-CM | POA: Insufficient documentation

## 2012-06-21 MED ORDER — CEPHALEXIN 500 MG PO CAPS: 500.0000 mg | ORAL_CAPSULE | Freq: Four times a day (QID) | ORAL | Status: DC

## 2012-06-21 MED ORDER — SULFAMETHOXAZOLE-TRIMETHOPRIM 800-160 MG PO TABS: 1.0000 | ORAL_TABLET | Freq: Two times a day (BID) | ORAL | Status: DC

## 2012-06-21 MED ORDER — DOXYCYCLINE HYCLATE 100 MG PO CAPS: 100.0000 mg | ORAL_CAPSULE | Freq: Two times a day (BID) | ORAL | Status: DC

## 2012-06-21 NOTE — ED Provider Notes (Signed)
History     CSN: [id number]  Arrival date & time 06/21/12  1708   First MD Initiated Contact with Patient 06/21/12 1726      Chief Complaint  Patient presents with  . Insect Bite    (Consider location/radiation/quality/duration/timing/severity/associated sxs/prior treatment) HPI Comments: Pt states that she thinks she got bit by something on her right shin three days ago and not the area around it getting more red:pt states that the area is also itchy:no definite exposure known:[place]pt states that she had a red area to the center and now the outer area is getting more red  The history is provided by the patient. No language interpreter was used.    Past Medical History  Diagnosis Date  . Hypertension     Past Surgical History  Procedure Laterality Date  . Cesarean section  01/2008    Family History  Problem Relation Age of Onset  . Hypertension Mother   . Diabetes Father     History  Substance Use Topics  . Smoking status: Never Smoker   . Smokeless tobacco: Never Used  . Alcohol Use: No    OB History   Grav Para Term Preterm Abortions TAB SAB Ect Mult Living                  Review of Systems  Constitutional: Negative.   Respiratory: Negative.   Cardiovascular: Negative.     Allergies  Review of patient's allergies indicates no known allergies.  Home Medications   Current Outpatient Rx  Name  Route  Sig  Dispense  Refill  . EXPIRED: amLODipine (NORVASC) 5 MG tablet   Oral   Take 1 tablet (5 mg total) by mouth daily.   30 tablet   0   . cephALEXin (KEFLEX) 500 MG capsule   Oral   Take 1 capsule (500 mg total) by mouth 4 (four) times daily.   28 capsule   0   . EXPIRED: fluticasone (FLONASE) 50 MCG/ACT nasal spray   Each Nare   Place 2 sprays into both nostrils daily as needed. For allergies          . hydrochlorothiazide 25 MG tablet   Oral   Take 25 mg by mouth daily.            Marland Kitchen labetalol (NORMODYNE) 200 MG tablet   Oral   Take 1 tablet (200 mg total) by mouth 2 (two) times daily.   60 tablet   1   . loratadine (CLARITIN) 10 MG tablet   Oral   Take 10 mg by mouth daily.           . potassium chloride (K-DUR) 10 MEQ tablet   Oral   Take 10 mEq by mouth daily.           Marland Kitchen sulfamethoxazole-trimethoprim (SEPTRA DS) 800-160 MG per tablet   Oral   Take 1 tablet by mouth 2 (two) times daily.   14 tablet   0     BP 130/85  Pulse 72  Temp(Src) 98.8 F (37.1 C) (Oral)  Resp 16  Ht 5\' 6"  (1.676 m)  Wt 182 lb (82.555 kg)  BMI 29.39 kg/m2  SpO2 100%  LMP 06/20/2012  Physical Exam  Nursing note and vitals reviewed. Constitutional: She is oriented to person, place, and time. She appears well-developed and well-nourished.  HENT:  Head: Normocephalic and atraumatic.  Eyes: Conjunctivae and  EOM are normal.  Cardiovascular: Normal rate and regular rhythm.   Pulmonary/Chest: Effort normal and breath sounds normal.  Musculoskeletal: Normal range of motion.  Neurological: She is alert and oriented to person, place, and time.  Skin:  Pt has a 1 cm bright red circular area to the right shin:pt has a lighter red area around 2 cm around that:no swelling noted  Psychiatric: She has a normal mood and affect.    ED Course  Procedures (including critical care time)  Labs Reviewed - No data to display No results found.   1. Skin infection       MDM  Can r/o target lesion:will treat with doxy        Teressa Lower, NP 06/21/12 1823

## 2012-06-21 NOTE — ED Provider Notes (Signed)
Medical screening examination/treatment/procedure(s) were performed by non-physician practitioner and as supervising physician I was immediately available for consultation/collaboration.   Marialuiza Car, MD 06/21/12 2217 

## 2012-06-21 NOTE — ED Notes (Signed)
Insect bite to right lower leg 2 days ago. Getting worse.

## 2014-04-18 ENCOUNTER — Emergency Department (HOSPITAL_BASED_OUTPATIENT_CLINIC_OR_DEPARTMENT_OTHER)
Admission: EM | Admit: 2014-04-18 | Discharge: 2014-04-18 | Disposition: A | Discharge status: Home / Self Care | Payer: BLUE CROSS/BLUE SHIELD | Attending: Emergency Medicine | Admitting: Emergency Medicine

## 2014-04-18 DIAGNOSIS — M545 Low back pain, unspecified: Secondary | ICD-10-CM

## 2014-04-18 DIAGNOSIS — I10 Essential (primary) hypertension: Secondary | ICD-10-CM | POA: Insufficient documentation

## 2014-04-18 DIAGNOSIS — Z792 Long term (current) use of antibiotics: Secondary | ICD-10-CM | POA: Diagnosis not present

## 2014-04-18 DIAGNOSIS — S3992XA Unspecified injury of lower back, initial encounter: Secondary | ICD-10-CM | POA: Diagnosis not present

## 2014-04-18 DIAGNOSIS — S4991XA Unspecified injury of right shoulder and upper arm, initial encounter: Secondary | ICD-10-CM | POA: Diagnosis not present

## 2014-04-18 DIAGNOSIS — M79601 Pain in right arm: Secondary | ICD-10-CM

## 2014-04-18 DIAGNOSIS — Y92410 Unspecified street and highway as the place of occurrence of the external cause: Secondary | ICD-10-CM | POA: Insufficient documentation

## 2014-04-18 DIAGNOSIS — Y998 Other external cause status: Secondary | ICD-10-CM | POA: Insufficient documentation

## 2014-04-18 DIAGNOSIS — Z79899 Other long term (current) drug therapy: Secondary | ICD-10-CM | POA: Insufficient documentation

## 2014-04-18 DIAGNOSIS — Y9389 Activity, other specified: Secondary | ICD-10-CM | POA: Diagnosis not present

## 2014-04-18 MED ORDER — TRAMADOL HCL 50 MG PO TABS
50.0000 mg | ORAL_TABLET | Freq: Four times a day (QID) | ORAL | Status: DC | PRN
Start: 2014-04-18 — End: 2020-01-23

## 2014-04-18 MED ORDER — IBUPROFEN 600 MG PO TABS: 600.0000 mg | ORAL_TABLET | Freq: Four times a day (QID) | ORAL | Status: DC | PRN

## 2014-04-18 MED ORDER — CYCLOBENZAPRINE HCL 10 MG PO TABS: 10.0000 mg | ORAL_TABLET | Freq: Two times a day (BID) | ORAL | Status: DC | PRN
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
## 2014-04-21 ENCOUNTER — Ambulatory Visit
Admission: RE | Admit: 2014-04-21 | Discharge: 2014-04-21 | Disposition: A | Discharge status: Home / Self Care | Payer: Worker's Compensation | Source: Ambulatory Visit | Attending: Nurse Practitioner | Admitting: Nurse Practitioner

## 2014-04-21 ENCOUNTER — Other Ambulatory Visit: Payer: Self-pay | Admitting: Nurse Practitioner

## 2014-04-21 DIAGNOSIS — R519 Headache, unspecified: Secondary | ICD-10-CM

## 2014-04-21 DIAGNOSIS — R51 Headache: Principal | ICD-10-CM

## 2014-04-21 DIAGNOSIS — G8929 Other chronic pain: Secondary | ICD-10-CM

## 2014-08-10 ENCOUNTER — Other Ambulatory Visit: Payer: Self-pay | Admitting: Nephrology

## 2014-08-10 DIAGNOSIS — R7982 Elevated C-reactive protein (CRP): Secondary | ICD-10-CM

## 2014-08-14 ENCOUNTER — Ambulatory Visit
Admission: RE | Admit: 2014-08-14 | Discharge: 2014-08-14 | Disposition: A | Discharge status: Home / Self Care | Payer: Self-pay | Source: Ambulatory Visit | Attending: Nephrology | Admitting: Nephrology

## 2014-08-14 DIAGNOSIS — R7982 Elevated C-reactive protein (CRP): Secondary | ICD-10-CM

## 2014-08-31 ENCOUNTER — Other Ambulatory Visit: Payer: Self-pay

## 2014-08-31 DIAGNOSIS — Z1231 Encounter for screening mammogram for malignant neoplasm of breast: Secondary | ICD-10-CM

## 2014-09-04 ENCOUNTER — Ambulatory Visit
Admission: RE | Admit: 2014-09-04 | Discharge: 2014-09-04 | Disposition: A | Discharge status: Home / Self Care | Payer: BLUE CROSS/BLUE SHIELD | Source: Ambulatory Visit

## 2014-09-04 DIAGNOSIS — Z1231 Encounter for screening mammogram for malignant neoplasm of breast: Secondary | ICD-10-CM

## 2015-10-19 ENCOUNTER — Other Ambulatory Visit: Payer: Self-pay | Admitting: Internal Medicine

## 2015-10-19 DIAGNOSIS — Z1231 Encounter for screening mammogram for malignant neoplasm of breast: Secondary | ICD-10-CM

## 2015-10-30 ENCOUNTER — Ambulatory Visit: Payer: Self-pay

## 2015-10-30 ENCOUNTER — Ambulatory Visit
Admission: RE | Admit: 2015-10-30 | Discharge: 2015-10-30 | Disposition: A | Discharge status: Home / Self Care | Payer: BLUE CROSS/BLUE SHIELD | Source: Ambulatory Visit | Attending: Internal Medicine | Admitting: Internal Medicine

## 2015-10-30 DIAGNOSIS — Z1231 Encounter for screening mammogram for malignant neoplasm of breast: Secondary | ICD-10-CM

## 2016-03-21 ENCOUNTER — Other Ambulatory Visit: Payer: Self-pay | Admitting: Obstetrics and Gynecology

## 2016-03-21 ENCOUNTER — Ambulatory Visit
Admission: RE | Admit: 2016-03-21 | Discharge: 2016-03-21 | Disposition: A | Discharge status: Home / Self Care | Payer: BLUE CROSS/BLUE SHIELD | Source: Ambulatory Visit | Attending: Obstetrics and Gynecology | Admitting: Obstetrics and Gynecology

## 2016-03-21 DIAGNOSIS — R109 Unspecified abdominal pain: Secondary | ICD-10-CM

## 2016-09-18 ENCOUNTER — Other Ambulatory Visit: Payer: Self-pay | Admitting: Family Medicine

## 2016-09-18 DIAGNOSIS — Z1231 Encounter for screening mammogram for malignant neoplasm of breast: Secondary | ICD-10-CM

## 2016-10-31 ENCOUNTER — Ambulatory Visit: Payer: Self-pay

## 2016-11-03 ENCOUNTER — Ambulatory Visit
Admission: RE | Admit: 2016-11-03 | Discharge: 2016-11-03 | Disposition: A | Discharge status: Home / Self Care | Payer: Managed Care, Other (non HMO) | Source: Ambulatory Visit | Attending: Family Medicine | Admitting: Family Medicine

## 2016-11-03 DIAGNOSIS — Z1231 Encounter for screening mammogram for malignant neoplasm of breast: Secondary | ICD-10-CM

## 2017-10-06 ENCOUNTER — Other Ambulatory Visit: Payer: Self-pay | Admitting: Obstetrics and Gynecology

## 2017-10-06 DIAGNOSIS — Z1231 Encounter for screening mammogram for malignant neoplasm of breast: Secondary | ICD-10-CM

## 2017-11-09 ENCOUNTER — Ambulatory Visit
Admission: RE | Admit: 2017-11-09 | Discharge: 2017-11-09 | Disposition: A | Discharge status: Home / Self Care | Payer: Managed Care, Other (non HMO) | Source: Ambulatory Visit | Attending: Obstetrics and Gynecology | Admitting: Obstetrics and Gynecology

## 2017-11-09 DIAGNOSIS — Z1231 Encounter for screening mammogram for malignant neoplasm of breast: Secondary | ICD-10-CM

## 2018-12-13 ENCOUNTER — Other Ambulatory Visit: Payer: Self-pay | Admitting: Obstetrics and Gynecology

## 2018-12-13 DIAGNOSIS — Z1231 Encounter for screening mammogram for malignant neoplasm of breast: Secondary | ICD-10-CM

## 2018-12-14 ENCOUNTER — Ambulatory Visit
Admission: RE | Admit: 2018-12-14 | Discharge: 2018-12-14 | Disposition: A | Discharge status: Home / Self Care | Payer: Managed Care, Other (non HMO) | Source: Ambulatory Visit | Attending: Obstetrics and Gynecology | Admitting: Obstetrics and Gynecology

## 2018-12-14 ENCOUNTER — Other Ambulatory Visit: Payer: Self-pay

## 2018-12-14 DIAGNOSIS — Z1231 Encounter for screening mammogram for malignant neoplasm of breast: Secondary | ICD-10-CM

## 2019-04-02 ENCOUNTER — Ambulatory Visit: Payer: Managed Care, Other (non HMO) | Attending: Internal Medicine

## 2019-04-02 DIAGNOSIS — Z23 Encounter for immunization: Secondary | ICD-10-CM

## 2019-04-02 NOTE — Progress Notes (Signed)
   Covid-19 Vaccination Clinic  Name:  Marie Perez    MRN: [id number] DOB: [birth year]  04/02/2019  [name] was observed post Covid-19 immunization for 15 minutes without incident. She was provided with Vaccine Information Sheet and instruction to access the V-Safe system.   [name] was instructed to call 911 with any severe reactions post vaccine: Marland Kitchen Difficulty breathing  . Swelling of face and throat  . A fast heartbeat  . A bad rash all over body  . Dizziness and weakness   Immunizations Administered    Name Date Dose VIS Date Route   Pfizer COVID-19 Vaccine 04/02/2019 11:04 AM 0.3 mL 12/31/2018 Intramuscular   Manufacturer: [employer], [employer]   Lot: [id number]   NDC: [address]-5

## 2019-04-25 ENCOUNTER — Ambulatory Visit: Payer: Managed Care, Other (non HMO)

## 2019-04-25 ENCOUNTER — Ambulatory Visit: Payer: Managed Care, Other (non HMO) | Attending: Internal Medicine

## 2019-04-25 DIAGNOSIS — Z23 Encounter for immunization: Secondary | ICD-10-CM

## 2019-04-25 NOTE — Progress Notes (Signed)
   Covid-19 Vaccination Clinic  Name:  [name]    MRN: [id number] DOB: 1976/01/14  04/25/2019  [name] was observed post Covid-19 immunization for 15 minutes without incident. She was provided with Vaccine Information Sheet and instruction to access the V-Safe system.   [name] was instructed to call 911 with any severe reactions post vaccine: Marland Kitchen Difficulty breathing  . Swelling of face and throat  . A fast heartbeat  . A bad rash all over body  . Dizziness and weakness   Immunizations Administered    Name Date Dose VIS Date Route   Pfizer COVID-19 Vaccine 04/25/2019  3:54 PM 0.3 mL 12/31/2018 Intramuscular   Manufacturer: [employer], [employer]   Lot: [id number]   NDC: [address]-7

## 2019-10-20 ENCOUNTER — Other Ambulatory Visit: Payer: Self-pay | Admitting: Obstetrics and Gynecology

## 2019-10-20 DIAGNOSIS — Z1231 Encounter for screening mammogram for malignant neoplasm of breast: Secondary | ICD-10-CM

## 2019-12-19 ENCOUNTER — Other Ambulatory Visit: Payer: Self-pay

## 2019-12-19 ENCOUNTER — Ambulatory Visit
Admission: RE | Admit: 2019-12-19 | Discharge: 2019-12-19 | Disposition: A | Discharge status: Home / Self Care | Payer: Managed Care, Other (non HMO) | Source: Ambulatory Visit | Attending: Obstetrics and Gynecology | Admitting: Obstetrics and Gynecology

## 2019-12-19 DIAGNOSIS — Z1231 Encounter for screening mammogram for malignant neoplasm of breast: Secondary | ICD-10-CM

## 2020-01-23 ENCOUNTER — Encounter: Payer: Self-pay | Admitting: Nurse Practitioner

## 2020-01-23 ENCOUNTER — Other Ambulatory Visit: Payer: Self-pay

## 2020-01-23 ENCOUNTER — Ambulatory Visit (INDEPENDENT_AMBULATORY_CARE_PROVIDER_SITE_OTHER): Payer: Managed Care, Other (non HMO) | Admitting: Nurse Practitioner

## 2020-01-23 VITALS — BP 132/72 | HR 89 | Temp 98.7°F | Ht 64.6 in | Wt 262.2 lb

## 2020-01-23 DIAGNOSIS — I10 Essential (primary) hypertension: Secondary | ICD-10-CM

## 2020-01-23 DIAGNOSIS — Z6841 Body Mass Index (BMI) 40.0 and over, adult: Secondary | ICD-10-CM

## 2020-01-23 DIAGNOSIS — Z7689 Persons encountering health services in other specified circumstances: Secondary | ICD-10-CM

## 2020-01-23 DIAGNOSIS — R7309 Other abnormal glucose: Secondary | ICD-10-CM | POA: Diagnosis not present

## 2020-01-23 DIAGNOSIS — Z1159 Encounter for screening for other viral diseases: Secondary | ICD-10-CM

## 2020-01-23 DIAGNOSIS — K219 Gastro-esophageal reflux disease without esophagitis: Secondary | ICD-10-CM

## 2020-01-23 DIAGNOSIS — E7800 Pure hypercholesterolemia, unspecified: Secondary | ICD-10-CM

## 2020-01-23 LAB — POCT URINALYSIS DIPSTICK
Bilirubin, UA: NEGATIVE
Blood, UA: NEGATIVE
Glucose, UA: POSITIVE — AB
Ketones, UA: NEGATIVE
Leukocytes, UA: NEGATIVE
Nitrite, UA: NEGATIVE
Protein, UA: NEGATIVE
Spec Grav, UA: 1.025 (ref 1.010–1.025)
Urobilinogen, UA: 0.2 E.U./dL
pH, UA: 6.5 (ref 5.0–8.0)

## 2020-01-23 LAB — POCT UA - MICROALBUMIN
Albumin/Creatinine Ratio, Urine, POC: 30
Creatinine, POC: 200 mg/dL
Microalbumin Ur, POC: 10 mg/L

## 2020-01-23 MED ORDER — OMEPRAZOLE 40 MG PO CPDR: 40.0000 mg | DELAYED_RELEASE_CAPSULE | Freq: Every day | ORAL | 1 refills | Status: DC

## 2020-01-23 MED ORDER — SAXENDA 18 MG/3ML ~~LOC~~ SOPN: 3.0000 mg | PEN_INJECTOR | Freq: Every day | SUBCUTANEOUS | 1 refills | Status: DC

## 2020-01-23 NOTE — Progress Notes (Signed)
I,Yamilka Roman [employer] as a Education administrator for [employer], FNP.,have documented all relevant documentation on the behalf of Minette Brine, FNP,as directed by  Minette Brine, FNP while in the presence of Minette Brine, [place]. This visit occurred during the SARS-CoV-2 public health emergency.  Safety protocols were in place, including screening questions prior to the visit, additional usage of staff PPE, and extensive cleaning of exam room while observing appropriate contact time as indicated for disinfecting solutions.  Subjective:     Patient ID: Marie Perez , female    DOB: 13-Apr-1975 , 45 y.o.   MRN: [id number]   Chief Complaint  Patient presents with  . Establish Care  . Hypertension  . Obesity    Patient would like to try a weight loss medicine    HPI  Patient is here to establish primary care. She stated she use to be a patient here a long time ago but her mother had gotten sick so she moved and was unable to come here. Her last pcp was Dr.Wong at [facility] - last month for regular routine visit. She is currently working with [employer] from home.  Married.  She has one child - daughter healthy - 85 y/o.  She sees Dr. Garwin Brothers for her GYN needs  PMH - hypertension (11 years started while pregnant), elevated cholesterol in the last 2 years , vitamin d deficiency.  She was also told she was prediabetic, never taken any medications for prediabetes in the past   Father MI at age of 48, mother - hypertension. Mother with cervical cancer - remission.  Maternal uncles with possible throat cancer. Brother - healthy.    Wt Readings from Last 3 Encounters: 01/23/20 : 262 lb 3.2 oz (118.9 kg) 04/18/14 : 182 lb (82.6 kg) 06/21/12 : 182 lb (82.6 kg)  She is walking 30 minutes a day for the last 3 weeks. She does eat an increase amount of bread.  She has been going to [facility] weekly for $20 per week.  She was encouraged to eat 1600 calories a day, she felt she was not eating enough.  She  has been going for 4 weeks.  She does notice when she eats 3 times a day meal with 2 snacks.    Hypertension This is a chronic problem. The problem is unchanged. The problem is controlled. Pertinent negatives include no anxiety, chest pain or palpitations. There are no associated agents to hypertension. Risk factors for coronary artery disease include obesity.     Past Medical History:  Diagnosis Date  . Hyperlipidemia   . Hypertension   . Vitamin D deficiency      Family History  Problem Relation Age of Onset  . Hypertension Mother   . Diabetes Father      Current Outpatient Medications:  .  atorvastatin (LIPITOR) 40 MG tablet, Take 40 mg by mouth daily., Disp: , Rfl:  .  fluticasone (FLONASE) 50 MCG/ACT nasal spray, Place 2 sprays into both nostrils daily as needed. For allergies, Disp: , Rfl:  .  hydrochlorothiazide 25 MG tablet, Take 25 mg by mouth daily., Disp: , Rfl:  .  ibuprofen (ADVIL,MOTRIN) 600 MG tablet, Take 1 tablet (600 mg total) by mouth every 6 (six) hours as needed., Disp: 30 tablet, Rfl: 0 .  labetalol (NORMODYNE) 200 MG tablet, Take 1 tablet (200 mg total) by mouth 2 (two) times daily., Disp: 60 tablet, Rfl: 1 .  Liraglutide -Weight Management (SAXENDA) 18 MG/3ML SOPN, Inject 3 mg  into the skin daily., Disp: 9 mL, Rfl: 1 .  loratadine (CLARITIN) 10 MG tablet, Take 10 mg by mouth daily., Disp: , Rfl:  .  omeprazole (PRILOSEC) 40 MG capsule, Take 1 capsule (40 mg total) by mouth daily., Disp: 90 capsule, Rfl: 1 .  Vitamin D, Ergocalciferol, (DRISDOL) 1.25 MG (50000 UNIT) CAPS capsule, Take 50,000 Units by mouth every 7 (seven) days., Disp: , Rfl:    No Known Allergies   Review of Systems  Constitutional: Negative.   HENT: Negative.   Eyes: Negative.   Respiratory: Negative.   Cardiovascular: Negative.  Negative for chest pain, palpitations and leg swelling.  Gastrointestinal: Negative.   Endocrine: Negative.   Genitourinary: Negative.   Musculoskeletal:  Negative.   Skin: Negative.   Neurological: Negative.   Hematological: Negative.   Psychiatric/Behavioral: Negative.      Today's Vitals   01/23/20 1422  BP: 132/72  Pulse: 89  Temp: 98.7 F (37.1 C)  TempSrc: Oral  Weight: 262 lb 3.2 oz (118.9 kg)  Height: 5' 4.6" (1.641 m)  PainSc: 6   PainLoc: Hip   Body mass index is 44.17 kg/m.   Objective:  Physical Exam Vitals reviewed.  Constitutional:      General: She is not in acute distress.    Appearance: Normal appearance. She is obese.  Cardiovascular:     Rate and Rhythm: Normal rate and regular rhythm.     Pulses: Normal pulses.     Heart sounds: Normal heart sounds. No murmur heard.   Pulmonary:     Effort: Pulmonary effort is normal. No respiratory distress.     Breath sounds: Normal breath sounds.  Skin:    Capillary Refill: Capillary refill takes less than 2 seconds.  Neurological:     General: No focal deficit present.     Mental Status: She is alert and oriented to person, place, and time.     Cranial Nerves: No cranial nerve deficit.     Motor: No weakness.  Psychiatric:        Mood and Affect: Mood normal.        Behavior: Behavior normal.        Thought Content: Thought content normal.        Judgment: Judgment normal.         Assessment And Plan:     1. Essential hypertension  Chronic, blood pressure is good today.   She has been on labetalol since starting 11 years ago I will not change at this time  - POCT Urinalysis Dipstick (81002) - POCT UA - Microalbumin - CMP14+EGFR - CBC  2. BMI 40.0-44.9, adult [facility])  Education given on Saxenda  May need a prior authorization   She has tried Qsymia in the past, unfortunately she has had an 90 lb weight gain since 2016.  Encouraged to continue with walking daily and to focus on cutting back with her sweets and bread.  - Liraglutide -Weight Management (SAXENDA) 18 MG/3ML SOPN; Inject 3 mg into the skin daily.  Dispense: 9 mL; Refill: 1  3.  Abnormal glucose  Reported by patient will check HgbA1c today and request records from Dr. Jacelyn Grip  4. Elevated cholesterol  Chronic, new within the last year and is tolerating medications well - Lipid panel  5. Encounter to establish care with new doctor  6. Gastroesophageal reflux disease without esophagitis  Refill sent for omeprazole  - omeprazole (PRILOSEC) 40 MG capsule; Take 1 capsule (40 mg total) by mouth daily.  Dispense: 90 capsule; Refill: 1  7. Encounter for hepatitis C screening test for low risk patient  Will check Hepatitis C screening due to recent recommendations to screen all adults 18 years and older - Hepatitis C antibody     Patient was given opportunity to ask questions. Patient verbalized understanding of the plan and was able to repeat key elements of the plan. All questions were answered to their satisfaction.  Minette Brine, FNP   I, Minette Brine, FNP, have reviewed all documentation for this visit. The documentation on 01/23/20 for the exam, diagnosis, procedures, and orders are all accurate and complete.   THE PATIENT IS ENCOURAGED TO PRACTICE SOCIAL DISTANCING DUE TO THE COVID-19 PANDEMIC.

## 2020-01-23 NOTE — Patient Instructions (Signed)

## 2020-01-24 LAB — CBC
Hematocrit: 43.1 % (ref 34.0–46.6)
Hemoglobin: 14.5 g/dL (ref 11.1–15.9)
MCH: 29.4 pg (ref 26.6–33.0)
MCHC: 33.6 g/dL (ref 31.5–35.7)
MCV: 87 fL (ref 79–97)
Platelets: 319 10*3/uL (ref 150–450)
RBC: 4.93 x10E6/uL (ref 3.77–5.28)
RDW: 13.2 % (ref 11.7–15.4)
WBC: 9.1 10*3/uL (ref 3.4–10.8)

## 2020-01-24 LAB — CMP14+EGFR
ALT: 20 IU/L (ref 0–32)
AST: 19 IU/L (ref 0–40)
Albumin/Globulin Ratio: 1.5 (ref 1.2–2.2)
Albumin: 4.5 g/dL (ref 3.8–4.8)
Alkaline Phosphatase: 59 IU/L (ref 44–121)
BUN/Creatinine Ratio: 9 (ref 9–23)
BUN: 11 mg/dL (ref 6–24)
Bilirubin Total: 0.6 mg/dL (ref 0.0–1.2)
CO2: 23 mmol/L (ref 20–29)
Calcium: 9.7 mg/dL (ref 8.7–10.2)
Chloride: 104 mmol/L (ref 96–106)
Creatinine, Ser: 1.22 mg/dL — ABNORMAL HIGH (ref 0.57–1.00)
GFR calc Af Amer: 62 mL/min/{1.73_m2} (ref >59)
GFR calc non Af Amer: 54 mL/min/{1.73_m2} — ABNORMAL LOW (ref >59)
Globulin, Total: 3 g/dL (ref 1.5–4.5)
Glucose: 86 mg/dL (ref 65–99)
Potassium: 4.4 mmol/L (ref 3.5–5.2)
Sodium: 140 mmol/L (ref 134–144)
Total Protein: 7.5 g/dL (ref 6.0–8.5)

## 2020-01-24 LAB — LIPID PANEL
Chol/HDL Ratio: 3.9 ratio (ref 0.0–4.4)
Cholesterol, Total: 194 mg/dL (ref 100–199)
HDL: 50 mg/dL (ref >39)
LDL Chol Calc (NIH): 129 mg/dL — ABNORMAL HIGH (ref 0–99)
Triglycerides: 84 mg/dL (ref 0–149)
VLDL Cholesterol Cal: 15 mg/dL (ref 5–40)

## 2020-01-24 LAB — HEPATITIS C ANTIBODY: Hep C Virus Ab: <0.1 s/co ratio (ref 0.0–0.9)

## 2020-01-25 ENCOUNTER — Other Ambulatory Visit: Payer: Self-pay

## 2020-01-25 DIAGNOSIS — R81 Glycosuria: Secondary | ICD-10-CM

## 2020-01-31 ENCOUNTER — Other Ambulatory Visit: Payer: Managed Care, Other (non HMO)

## 2020-01-31 ENCOUNTER — Encounter: Payer: Self-pay | Admitting: Nurse Practitioner

## 2020-01-31 ENCOUNTER — Other Ambulatory Visit: Payer: Self-pay

## 2020-01-31 DIAGNOSIS — R81 Glycosuria: Secondary | ICD-10-CM

## 2020-01-31 LAB — HEMOGLOBIN A1C
Est. average glucose Bld gHb Est-mCnc: 134 mg/dL
Hgb A1c MFr Bld: 6.3 % — ABNORMAL HIGH (ref 4.8–5.6)

## 2020-01-31 MED ORDER — NOVOFINE PEN NEEDLE 32G X 6 MM MISC: 2 refills | Status: DC

## 2020-02-14 ENCOUNTER — Encounter: Payer: Self-pay | Admitting: Nurse Practitioner

## 2020-02-14 NOTE — Telephone Encounter (Signed)
Schedule follow up visit new medication in 4-6 weeks

## 2020-02-17 ENCOUNTER — Other Ambulatory Visit: Payer: Self-pay

## 2020-02-17 MED ORDER — ESOMEPRAZOLE MAGNESIUM 20 MG PO CPDR: 20.0000 mg | DELAYED_RELEASE_CAPSULE | Freq: Every day | ORAL | 2 refills | Status: DC

## 2020-02-22 ENCOUNTER — Ambulatory Visit: Payer: Managed Care, Other (non HMO) | Admitting: Nurse Practitioner

## 2020-02-23 ENCOUNTER — Ambulatory Visit: Payer: Managed Care, Other (non HMO) | Admitting: Nurse Practitioner

## 2020-02-28 ENCOUNTER — Ambulatory Visit: Payer: Managed Care, Other (non HMO) | Admitting: Nurse Practitioner

## 2020-03-01 ENCOUNTER — Ambulatory Visit: Payer: Managed Care, Other (non HMO) | Admitting: Nurse Practitioner

## 2020-03-01 ENCOUNTER — Encounter: Payer: Self-pay | Admitting: Nurse Practitioner

## 2020-03-01 ENCOUNTER — Other Ambulatory Visit: Payer: Self-pay

## 2020-03-01 VITALS — BP 142/102 | HR 83 | Temp 98.2°F | Ht 64.6 in | Wt 254.0 lb

## 2020-03-01 DIAGNOSIS — I10 Essential (primary) hypertension: Secondary | ICD-10-CM

## 2020-03-01 DIAGNOSIS — Z1211 Encounter for screening for malignant neoplasm of colon: Secondary | ICD-10-CM

## 2020-03-01 DIAGNOSIS — K219 Gastro-esophageal reflux disease without esophagitis: Secondary | ICD-10-CM

## 2020-03-01 DIAGNOSIS — Z6841 Body Mass Index (BMI) 40.0 and over, adult: Secondary | ICD-10-CM

## 2020-03-01 MED ORDER — SAXENDA 18 MG/3ML ~~LOC~~ SOPN: 3.0000 mg | PEN_INJECTOR | Freq: Every day | SUBCUTANEOUS | 1 refills | Status: DC

## 2020-03-01 MED ORDER — HYDROCHLOROTHIAZIDE 25 MG PO TABS: 25.0000 mg | ORAL_TABLET | Freq: Every day | ORAL | 1 refills | Status: DC

## 2020-03-01 MED ORDER — AMLODIPINE BESYLATE 5 MG PO TABS: 5.0000 mg | ORAL_TABLET | Freq: Every day | ORAL | 3 refills | Status: DC

## 2020-03-01 NOTE — Patient Instructions (Addendum)
Exercising to Lose Weight Exercise is structured, repetitive physical activity to improve fitness and health. Getting regular exercise is important for everyone. It is especially important if you are overweight. Being overweight increases your risk of heart disease, stroke, diabetes, high blood pressure, and several types of cancer. Reducing your calorie intake and exercising can help you lose weight. Exercise is usually categorized as moderate or vigorous intensity. To lose weight, most people need to do a certain amount of moderate-intensity or vigorous-intensity exercise each week. Moderate-intensity exercise Moderate-intensity exercise is any activity that gets you moving enough to burn at least three times more energy (calories) than if you were sitting. Examples of moderate exercise include:  Walking a mile in 15 minutes.  Doing light yard work.  Biking at an easy pace. Most people should get at least 150 minutes (2 hours and 30 minutes) a week of moderate-intensity exercise to maintain their body weight.   Vigorous-intensity exercise Vigorous-intensity exercise is any activity that gets you moving enough to burn at least six times more calories than if you were sitting. When you exercise at this intensity, you should be working hard enough that you are not able to carry on a conversation. Examples of vigorous exercise include:  Running.  Playing a team sport, such as football, basketball, and soccer.  Jumping rope. Most people should get at least 75 minutes (1 hour and 15 minutes) a week of vigorous-intensity exercise to maintain their body weight. How can exercise affect me? When you exercise enough to burn more calories than you eat, you lose weight. Exercise also reduces body fat and builds muscle. The more muscle you have, the more calories you burn. Exercise also:  Improves mood.  Reduces stress and tension.  Improves your overall fitness, flexibility, and  endurance.  Increases bone strength. The amount of exercise you need to lose weight depends on:  Your age.  The type of exercise.  Any health conditions you have.  Your overall physical ability. Talk to your health care provider about how much exercise you need and what types of activities are safe for you. What actions can I take to lose weight? Nutrition  Make changes to your diet as told by your health care provider or diet and nutrition specialist (dietitian). This may include: ? Eating fewer calories. ? Eating more protein. ? Eating less unhealthy fats. ? Eating a diet that includes fresh fruits and vegetables, whole grains, low-fat dairy products, and lean protein. ? Avoiding foods with added fat, salt, and sugar.  Drink plenty of water while you exercise to prevent dehydration or heat stroke.   Activity  Choose an activity that you enjoy and set realistic goals. Your health care provider can help you make an exercise plan that works for you.  Exercise at a moderate or vigorous intensity most days of the week. ? The intensity of exercise may vary from person to person. You can tell how intense a workout is for you by paying attention to your breathing and heartbeat. Most people will notice their breathing and heartbeat get faster with more intense exercise.  Do resistance training twice each week, such as: ? Push-ups. ? Sit-ups. ? Lifting weights. ? Using resistance bands.  Getting short amounts of exercise can be just as helpful as long structured periods of exercise. If you have trouble finding time to exercise, try to include exercise in your daily routine. ? Get up, stretch, and walk around every 30 minutes throughout the day. ? Go   for a walk during your lunch break. ? Park your car farther away from your destination. ? If you take public transportation, get off one stop early and walk the rest of the way. ? Make phone calls while standing up and walking  around. ? Take the stairs instead of elevators or escalators.  Wear comfortable clothes and shoes with good support.  Do not exercise so much that you hurt yourself, feel dizzy, or get very short of breath. Where to find more information  U.S. Department of Health and Human Services: ThisPath.fi  Centers for Disease Control and Prevention (CDC): FootballExhibition.com.br Contact a health care provider:  Before starting a new exercise program.  If you have questions or concerns about your weight.  If you have a medical problem that keeps you from exercising. Get help right away if you have any of the following while exercising:  Injury.  Dizziness.  Difficulty breathing or shortness of breath that does not go away when you stop exercising.  Chest pain.  Rapid heartbeat. Summary  Being overweight increases your risk of heart disease, stroke, diabetes, high blood pressure, and several types of cancer.  Losing weight happens when you burn more calories than you eat.  Reducing the amount of calories you eat in addition to getting regular moderate or vigorous exercise each week helps you lose weight. This information is not intended to replace advice given to you by your health care provider. Make sure you discuss any questions you have with your health care provider. Document Revised: 05/05/2019 Document Reviewed: 05/05/2019 Elsevier Patient Education  2021 Elsevier Inc.    We are going to wean off the labetolol, take one of your labetolol a day in the morning and take the amlodipine in the evening for 2 weeks then stop the labetolol the next week and take amlodipine in the morning. Continue with your hydrochlorthiazide in the morning.    Eat a low salt diet.   Take 250 mg of magnesium with evening meal daily to help with blood pressure control

## 2020-03-01 NOTE — Progress Notes (Signed)
Tomasa Hose as a scribe for Arnette Felts, FNP.,have documented all relevant documentation on the behalf of Arnette Felts, FNP,as directed by  Arnette Felts, FNP while in the presence of Arnette Felts, FNP. This visit occurred during the SARS-CoV-2 public health emergency.  Safety protocols were in place, including screening questions prior to the visit, additional usage of staff PPE, and extensive cleaning of exam room while observing appropriate contact time as indicated for disinfecting solutions.  Subjective:     Patient ID: Marie Perez , female    DOB: 29-Oct-1975 , 45 y.o.   MRN: [id number]   Chief Complaint  Patient presents with  . Obesity    HPI  Pt here today for weight check. She is taking [place], she is unable to finish meals.  She is exercising 3 days a week at the gym and will walk the days in between.  She is having heartburn, she is not taking a probiotic.   Wt Readings from Last 3 Encounters: 03/01/20 : 254 lb (115.2 kg) 01/23/20 : 262 lb 3.2 oz (118.9 kg) 04/18/14 : 182 lb (82.6 kg) Hypertension This is a chronic problem. The current episode started more than 1 year ago. The problem has been gradually worsening since onset. The problem is uncontrolled. Associated symptoms include headaches. Pertinent negatives include no anxiety or malaise/fatigue. Risk factors for coronary artery disease include obesity and sedentary lifestyle. Past treatments include beta blockers and diuretics. There is no history of angina. There is no history of chronic renal disease.     Past Medical History:  Diagnosis Date  . Hyperlipidemia   . Hypertension   . Vitamin D deficiency      Family History  Problem Relation Age of Onset  . Hypertension Mother   . Diabetes Father      Current Outpatient Medications:  .  amLODipine (NORVASC) 5 MG tablet, Take 1 tablet (5 mg total) by mouth daily., Disp: 30 tablet, Rfl: 3 .  atorvastatin (LIPITOR) 40 MG tablet, Take 40 mg by  mouth daily., Disp: , Rfl:  .  esomeprazole (NEXIUM) 20 MG capsule, Take 1 capsule (20 mg total) by mouth daily., Disp: 30 capsule, Rfl: 2 .  fluticasone (FLONASE) 50 MCG/ACT nasal spray, Place 2 sprays into both nostrils daily as needed. For allergies, Disp: , Rfl:  .  ibuprofen (ADVIL,MOTRIN) 600 MG tablet, Take 1 tablet (600 mg total) by mouth every 6 (six) hours as needed., Disp: 30 tablet, Rfl: 0 .  Insulin Pen Needle (NOVOFINE PEN NEEDLE) 32G X 6 MM MISC, Use with saxenda, Disp: 50 each, Rfl: 2 .  labetalol (NORMODYNE) 200 MG tablet, Take 1 tablet (200 mg total) by mouth 2 (two) times daily., Disp: 60 tablet, Rfl: 1 .  linaclotide (LINZESS) 290 MCG CAPS capsule, Take 290 mcg by mouth daily before breakfast., Disp: , Rfl:  .  loratadine (CLARITIN) 10 MG tablet, Take 10 mg by mouth daily., Disp: , Rfl:  .  Vitamin D, Ergocalciferol, (DRISDOL) 1.25 MG (50000 UNIT) CAPS capsule, Take 50,000 Units by mouth every 7 (seven) days., Disp: , Rfl:  .  hydrochlorothiazide (HYDRODIURIL) 25 MG tablet, Take 1 tablet (25 mg total) by mouth daily., Disp: 90 tablet, Rfl: 1 .  Liraglutide -Weight Management (SAXENDA) 18 MG/3ML SOPN, Inject 3 mg into the skin daily., Disp: 9 mL, Rfl: 1   No Known Allergies   Review of Systems  Constitutional: Negative.  Negative for malaise/fatigue.  HENT: Negative.   Eyes: Negative.  Respiratory: Negative.   Cardiovascular: Negative.   Gastrointestinal: Negative.        She is having more heartburn with anything she eats  Endocrine: Negative.   Genitourinary: Negative.   Musculoskeletal: Negative.   Skin: Negative.   Neurological: Positive for headaches.  Hematological: Negative.   Psychiatric/Behavioral: Negative.      Today's Vitals   03/01/20 1547  BP: (!) 142/102  Pulse: 83  Temp: 98.2 F (36.8 C)  TempSrc: Oral  Weight: 254 lb (115.2 kg)  Height: 5' 4.6" (1.641 m)  PainSc: 0-No pain   Body mass index is 42.79 kg/m.   Objective:  Physical  Exam Constitutional:      Appearance: Normal appearance. She is obese.  Cardiovascular:     Rate and Rhythm: Normal rate and regular rhythm.     Pulses: Normal pulses.     Heart sounds: Normal heart sounds. No murmur heard.   Pulmonary:     Effort: Pulmonary effort is normal. No respiratory distress.     Breath sounds: Normal breath sounds. No wheezing.  Abdominal:     General: Abdomen is flat. Bowel sounds are normal.     Palpations: Abdomen is soft.  Musculoskeletal:        General: Normal range of motion.     Cervical back: Normal range of motion and neck supple.  Skin:    General: Skin is warm and dry.     Capillary Refill: Capillary refill takes less than 2 seconds.  Neurological:     General: No focal deficit present.     Mental Status: She is alert and oriented to person, place, and time.  Psychiatric:        Mood and Affect: Mood normal.        Behavior: Behavior normal.        Thought Content: Thought content normal.        Judgment: Judgment normal.         Assessment And Plan:     1. Essential hypertension  Chronic, blood pressure is not better with HCTZ, I am adding amlodipine  Discussed side effects to include possible swelling to ankles. - hydrochlorothiazide (HYDRODIURIL) 25 MG tablet; Take 1 tablet (25 mg total) by mouth daily.  Dispense: 90 tablet; Refill: 1 - amLODipine (NORVASC) 5 MG tablet; Take 1 tablet (5 mg total) by mouth daily.  Dispense: 30 tablet; Refill: 3  2. BMI 40.0-44.9, adult (HCC)  Chronic, she has had a 7 lb weight loss on Saxenda  Encouraged to increase physical activity to at least 150 minutes per week - Liraglutide -Weight Management (SAXENDA) 18 MG/3ML SOPN; Inject 3 mg into the skin daily.  Dispense: 9 mL; Refill: 1  3. Gastroesophageal reflux disease without esophagitis  She is not tolerating nexium well, I have given her samples of restora and pepcid to try  This may be worsening due to the Saxenda, encouraged to use a  probiotic as this may help - Ambulatory referral to Gastroenterology  4. Encounter for screening colonoscopy  According to USPTF Colorectal cancer Screening guidelines. Colonoscopy is recommended every 10 years, starting at age 56 years, pending insurance approval.  Will refer to GI for colon cancer screening. - Ambulatory referral to Gastroenterology      Patient was given opportunity to ask questions. Patient verbalized understanding of the plan and was able to repeat key elements of the plan. All questions were answered to their satisfaction.  Arnette Felts, FNP   I, Arnette Felts, FNP,  have reviewed all documentation for this visit. The documentation on 03/04/20 for the exam, diagnosis, procedures, and orders are all accurate and complete.   THE PATIENT IS ENCOURAGED TO PRACTICE SOCIAL DISTANCING DUE TO THE COVID-19 PANDEMIC.

## 2020-03-02 ENCOUNTER — Encounter: Payer: Self-pay | Admitting: Nurse Practitioner

## 2020-03-23 ENCOUNTER — Encounter: Payer: Self-pay | Admitting: Nurse Practitioner

## 2020-03-28 ENCOUNTER — Encounter: Payer: Self-pay | Admitting: Nurse Practitioner

## 2020-03-29 ENCOUNTER — Encounter: Payer: Self-pay | Admitting: Nurse Practitioner

## 2020-03-29 ENCOUNTER — Other Ambulatory Visit: Payer: Self-pay

## 2020-03-29 ENCOUNTER — Ambulatory Visit: Payer: Managed Care, Other (non HMO) | Admitting: Nurse Practitioner

## 2020-03-29 VITALS — BP 132/82 | HR 85 | Temp 98.2°F | Ht 63.8 in | Wt 247.4 lb

## 2020-03-29 DIAGNOSIS — I10 Essential (primary) hypertension: Secondary | ICD-10-CM

## 2020-03-29 DIAGNOSIS — M25511 Pain in right shoulder: Secondary | ICD-10-CM | POA: Diagnosis not present

## 2020-03-29 DIAGNOSIS — M25512 Pain in left shoulder: Secondary | ICD-10-CM

## 2020-03-29 MED ORDER — TRAMADOL HCL 50 MG PO TABS: 50.0000 mg | ORAL_TABLET | Freq: Four times a day (QID) | ORAL | 0 refills | Status: DC | PRN
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
## 2020-03-29 NOTE — Patient Instructions (Signed)

## 2020-04-10 ENCOUNTER — Ambulatory Visit: Payer: Managed Care, Other (non HMO) | Admitting: Nurse Practitioner

## 2020-04-10 ENCOUNTER — Other Ambulatory Visit: Payer: Self-pay

## 2020-04-10 ENCOUNTER — Encounter (HOSPITAL_BASED_OUTPATIENT_CLINIC_OR_DEPARTMENT_OTHER): Payer: Self-pay | Admitting: *Deleted

## 2020-04-10 ENCOUNTER — Emergency Department (HOSPITAL_BASED_OUTPATIENT_CLINIC_OR_DEPARTMENT_OTHER)
Admission: EM | Admit: 2020-04-10 | Discharge: 2020-04-10 | Disposition: A | Discharge status: Home / Self Care | Payer: Managed Care, Other (non HMO) | Attending: Emergency Medicine | Admitting: Emergency Medicine

## 2020-04-10 ENCOUNTER — Encounter: Payer: Self-pay | Admitting: Nurse Practitioner

## 2020-04-10 VITALS — BP 120/64 | HR 68 | Ht 64.0 in | Wt 247.0 lb

## 2020-04-10 DIAGNOSIS — Z794 Long term (current) use of insulin: Secondary | ICD-10-CM | POA: Insufficient documentation

## 2020-04-10 DIAGNOSIS — S59912A Unspecified injury of left forearm, initial encounter: Secondary | ICD-10-CM | POA: Diagnosis present

## 2020-04-10 DIAGNOSIS — W208XXA Other cause of strike by thrown, projected or falling object, initial encounter: Secondary | ICD-10-CM | POA: Insufficient documentation

## 2020-04-10 DIAGNOSIS — K5900 Constipation, unspecified: Secondary | ICD-10-CM

## 2020-04-10 DIAGNOSIS — T148XXA Other injury of unspecified body region, initial encounter: Secondary | ICD-10-CM

## 2020-04-10 DIAGNOSIS — Z79899 Other long term (current) drug therapy: Secondary | ICD-10-CM | POA: Diagnosis not present

## 2020-04-10 DIAGNOSIS — S5012XA Contusion of left forearm, initial encounter: Secondary | ICD-10-CM | POA: Diagnosis not present

## 2020-04-10 DIAGNOSIS — I10 Essential (primary) hypertension: Secondary | ICD-10-CM | POA: Insufficient documentation

## 2020-04-10 DIAGNOSIS — K219 Gastro-esophageal reflux disease without esophagitis: Secondary | ICD-10-CM | POA: Diagnosis not present

## 2020-04-10 LAB — CBC WITH DIFFERENTIAL/PLATELET
Abs Immature Granulocytes: 0.02 10*3/uL (ref 0.00–0.07)
Basophils Absolute: 0 10*3/uL (ref 0.0–0.1)
Basophils Relative: 0 %
Eosinophils Absolute: 0.1 10*3/uL (ref 0.0–0.5)
Eosinophils Relative: 1 %
HCT: 42.5 % (ref 36.0–46.0)
Hemoglobin: 14.5 g/dL (ref 12.0–15.0)
Immature Granulocytes: 0 %
Lymphocytes Relative: 33 %
Lymphs Abs: 3.1 10*3/uL (ref 0.7–4.0)
MCH: 29.6 pg (ref 26.0–34.0)
MCHC: 34.1 g/dL (ref 30.0–36.0)
MCV: 86.7 fL (ref 80.0–100.0)
Monocytes Absolute: 0.6 10*3/uL (ref 0.1–1.0)
Monocytes Relative: 7 %
Neutro Abs: 5.6 10*3/uL (ref 1.7–7.7)
Neutrophils Relative %: 59 %
Platelets: 323 10*3/uL (ref 150–400)
RBC: 4.9 MIL/uL (ref 3.87–5.11)
RDW: 13.8 % (ref 11.5–15.5)
WBC: 9.4 10*3/uL (ref 4.0–10.5)
nRBC: 0 % (ref 0.0–0.2)

## 2020-04-10 MED ORDER — LUBIPROSTONE 24 MCG PO CAPS: 24.0000 ug | ORAL_CAPSULE | Freq: Two times a day (BID) | ORAL | 3 refills | Status: DC

## 2020-04-10 MED ORDER — PLENVU 140 G PO SOLR: 1.0000 | Freq: Once | ORAL | 0 refills | Status: AC

## 2020-04-10 NOTE — ED Provider Notes (Signed)
[facility] EMERGENCY DEPT Provider Note   CSN: [id number] Arrival date & time: 04/10/20  1720     History Chief Complaint  Patient presents with  . Arm Pain    Marie Perez is a 45 y.o. female.  Pt presents to the ED today with left arm pain and swelling to her right forehead.  Pt said her 80 lb boxer stepped on her left forearm about 2 weeks ago.  She said the dog put all of her weight on her arm.  She said the bruise did not come out right away, but it is still there.  Pt has also noticed a swelling to her right forehead when she bites down.  No pain.  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  She is not sure if that is related.        Past Medical History:  Diagnosis Date  . Hyperlipidemia   . Hypertension   . Vitamin D deficiency     Patient Active Problem List   Diagnosis Date Noted  . Musculoskeletal pain 12/24/2010  . Sinusitis 05/01/2010  . CARPAL TUNNEL SYNDROME, RIGHT 04/05/2010  . FATIGUE 02/27/2010  . FREQUENCY, URINARY 11/26/2009  . OBESITY, UNSPECIFIED 10/31/2008  . HYPERTENSION 10/31/2008  . UTI 10/31/2008  . VAGINITIS, BACTERIAL 10/31/2008  . GOITER, NONTOXIC, DIFFUSE 01/01/2000    Past Surgical History:  Procedure Laterality Date  . CESAREAN SECTION  01/2008     OB History    Gravida  1   Para  1   Term      Preterm      AB      Living        SAB      IAB      Ectopic      Multiple      Live Births              Family History  Problem Relation Age of Onset  . Hypertension Mother   . Diabetes Father     Social History   Tobacco Use  . Smoking status: Never Smoker  . Smokeless tobacco: Never Used  Vaping Use  . Vaping Use: Never used  Substance Use Topics  . Alcohol use: No  . Drug use: No    Home Medications Prior to Admission medications   Medication Sig Start Date End Date Taking? Authorizing Provider  amLODipine (NORVASC) 5 MG tablet Take 1 tablet (5 mg total) by  mouth daily. 03/01/20 03/01/21  Minette Brine, FNP  atorvastatin (LIPITOR) 40 MG tablet Take 40 mg by mouth daily.    [provider]  esomeprazole (NEXIUM) 20 MG capsule Take 1 capsule (20 mg total) by mouth daily. 02/17/20 02/16/21  Minette Brine, FNP  fluticasone (FLONASE) 50 MCG/ACT nasal spray Place 2 sprays into both nostrils daily as needed. For allergies 05/01/10 04/10/20  Debbrah Alar, NP  hydrochlorothiazide (HYDRODIURIL) 25 MG tablet Take 1 tablet (25 mg total) by mouth daily. 03/01/20   Minette Brine, FNP  ibuprofen (ADVIL,MOTRIN) 600 MG tablet Take 1 tablet (600 mg total) by mouth every 6 (six) hours as needed. 04/18/14   Noe Gens, PA-C  Insulin Pen Needle (NOVOFINE PEN NEEDLE) 32G X 6 MM MISC Use with saxenda 01/31/20   Minette Brine, FNP  Liraglutide -Weight Management (SAXENDA) 18 MG/3ML SOPN Inject 3 mg into the skin daily. 03/01/20   Minette Brine, FNP  loratadine (CLARITIN) 10 MG tablet Take 10 mg  by mouth daily.    [provider]  lubiprostone (AMITIZA) 24 MCG capsule Take 1 capsule (24 mcg total) by mouth 2 (two) times daily with a meal. 04/10/20   Willia Craze, NP  PLENVU 140 g SOLR Take 1 kit by mouth once for 1 dose. 04/10/20 04/10/20  Willia Craze, NP  traMADol (ULTRAM) 50 MG tablet Take 1 tablet (50 mg total) by mouth every 6 (six) hours as needed. 03/29/20 03/29/21  Bary Castilla, NP  Vitamin D, Ergocalciferol, (DRISDOL) 1.25 MG (50000 UNIT) CAPS capsule Take 50,000 Units by mouth every 7 (seven) days.    [provider]    Allergies    Patient has no known allergies.  Review of Systems   Review of Systems  HENT:       Swelling to right forehead  Skin:       Bruise to left forearm  All other systems reviewed and are negative.   Physical Exam Updated Vital Signs BP (!) 151/115 (BP Location: Right Arm)   Pulse 92   Temp 98.6 F (37 C) (Oral)   Resp 16   Ht $R'5\' 4"'pB$  (1.626 m)   Wt 81.6 kg   LMP 03/20/2020   SpO2 100%    BMI 30.90 kg/m   Physical Exam Vitals and nursing note reviewed.  Constitutional:      Appearance: Normal appearance.  HENT:     Head: Normocephalic and atraumatic.     Comments: Some prominence to the right frontalis/temporalis muscle on the right when she bites down.    Right Ear: External ear normal.     Left Ear: External ear normal.     Nose: Nose normal.     Mouth/Throat:     Mouth: Mucous membranes are moist.     Pharynx: Oropharynx is clear.  Eyes:     Extraocular Movements: Extraocular movements intact.     Conjunctiva/sclera: Conjunctivae normal.     Pupils: Pupils are equal, round, and reactive to light.  Cardiovascular:     Rate and Rhythm: Normal rate and regular rhythm.     Pulses: Normal pulses.     Heart sounds: Normal heart sounds.  Pulmonary:     Effort: Pulmonary effort is normal.     Breath sounds: Normal breath sounds.  Abdominal:     General: Abdomen is flat. Bowel sounds are normal.     Palpations: Abdomen is soft.  Musculoskeletal:        General: Normal range of motion.     Cervical back: Normal range of motion and neck supple.  Skin:    Capillary Refill: Capillary refill takes less than 2 seconds.     Comments: Bruise to left forearm  Neurological:     General: No focal deficit present.     Mental Status: She is alert and oriented to person, place, and time.  Psychiatric:        Mood and Affect: Mood normal.        Behavior: Behavior normal.        Thought Content: Thought content normal.        Judgment: Judgment normal.     ED Results / Procedures / Treatments   Labs (all labs ordered are listed, but only abnormal results are displayed) Labs Reviewed  CBC WITH DIFFERENTIAL/PLATELET    EKG None  Radiology No results found.  Procedures Procedures   Medications Ordered in ED Medications - No data to display  ED Course  I have  reviewed the triage vital signs and the nursing notes.  Pertinent labs & imaging results that  were available during my care of the patient were reviewed by me and considered in my medical decision making (see chart for details).    MDM Rules/Calculators/A&P                          CBC is nl.  No abnormal h/h or plt.  Forehead swelling does not look like anything worrisome.  Pt is encouraged to return if worse.  F/u with pcp.  Final Clinical Impression(s) / ED Diagnoses Final diagnoses:  Bruise    Rx / DC Orders ED Discharge Orders    None       Isla Pence, MD 04/10/20 1827

## 2020-04-10 NOTE — ED Triage Notes (Signed)
Patient's dog, Boxer, put her paws on the patient's left arm and left a bruise.  Bruise has been there for 2 weeks and has not gone away.  Patient is also concerned about the swollen forehead on the right side.

## 2020-04-10 NOTE — Progress Notes (Signed)
ASSESSMENT AND PLAN     # 45 yo female in need of colon cancer screening.  --Patient will be scheduled for a colonoscopy. The risks and benefits of colonoscopy with possible polypectomy / biopsies were discussed and the patient agrees to proceed.  --Two day bowel prep given history of chronic constipation  # Chronic constipation.  Symptoms worse since starting Saxenda a few months ago.  Linzess seems to be losing efficacy.  --Trial of Amitiza 24 mcg twice daily.  If not covered by insurance then she can resume Linzess and try adding a capful of MiraLAX in 8 ounces of water a day  # Chronic GERD.  Recently changed to Nexium from omeprazole due to breakthrough symptoms.  She is still having some breakthrough symptoms with heartburn and sensation of an inability to belch. Recently started Saxenda which can cause / exacerbate upper GI symptoms --For longstanding history of GERD and breakthrough symptoms will schedule patient for EGD to be done at time of colonoscopy. The risks and benefits of EGD were discussed and the patient agrees to proceed.  --Antireflux measures discussed  #Prediabetes --She has opted for management through diet and exercise prior to considering medication  HISTORY OF PRESENT ILLNESS     Chief Complaint : GERD  Marie Perez is a 45 y.o. female with past medical history significant GERD, obesity, chronic constipation, pre-diabetes, HTN   Patient is referred by PCP for GERD and colon cancer screening.  She gives a longstanding history of GERD with heartburn and also feeling like she cannot burp. For years she took Tums then started Omeprazole which she took for about 3 years. Omeprazole seemed to stopped working when BP meds changed to Norvasc. At that point she was changed to Nexium. She takes it prior to breakfast. She is still having some breakthrough symptoms but it it very dependent on what she eats. No dysphagia.   Patient has chronic constipation ( since  childhood). She started [place] 3 months ago and constipation and gotten worse. She takes Linzess every day but doesn't have a BM anymore than two times a week.  No blood in stool. She needs a colon cancer screening. No blood in stool    Data Reviewed 01/23/20 Cr 1.22, CMP otherwise unremarkable CBC normal Hemoglobin A1c 6.3    PREVIOUS EVALUATIONS:    Past Medical History:  Diagnosis Date   Hyperlipidemia    Hypertension    Vitamin D deficiency      Past Surgical History:  Procedure Laterality Date   CESAREAN SECTION  01/2008   Family History  Problem Relation Age of Onset   Hypertension Mother    Diabetes Father    Social History   Tobacco Use   Smoking status: Never Smoker   Smokeless tobacco: Never Used  Vaping Use   Vaping Use: Never used  Substance Use Topics   Alcohol use: No   Drug use: No   Current Outpatient Medications  Medication Sig Dispense Refill   amLODipine (NORVASC) 5 MG tablet Take 1 tablet (5 mg total) by mouth daily. 30 tablet 3   atorvastatin (LIPITOR) 40 MG tablet Take 40 mg by mouth daily.     esomeprazole (NEXIUM) 20 MG capsule Take 1 capsule (20 mg total) by mouth daily. 30 capsule 2   fluticasone (FLONASE) 50 MCG/ACT nasal spray Place 2 sprays into both nostrils daily as needed. For allergies     hydrochlorothiazide (HYDRODIURIL) 25 MG tablet Take 1 tablet (25 mg  total) by mouth daily. 90 tablet 1   ibuprofen (ADVIL,MOTRIN) 600 MG tablet Take 1 tablet (600 mg total) by mouth every 6 (six) hours as needed. 30 tablet 0   Insulin Pen Needle (NOVOFINE PEN NEEDLE) 32G X 6 MM MISC Use with saxenda 50 each 2   linaclotide (LINZESS) 290 MCG CAPS capsule Take 290 mcg by mouth daily before breakfast.     Liraglutide -Weight Management (SAXENDA) 18 MG/3ML SOPN Inject 3 mg into the skin daily. 9 mL 1   loratadine (CLARITIN) 10 MG tablet Take 10 mg by mouth daily.     traMADol (ULTRAM) 50 MG tablet Take 1 tablet (50 mg total) by  mouth every 6 (six) hours as needed. 15 tablet 0   Vitamin D, Ergocalciferol, (DRISDOL) 1.25 MG (50000 UNIT) CAPS capsule Take 50,000 Units by mouth every 7 (seven) days.     No current facility-administered medications for this visit.   No Known Allergies   Review of Systems: Positive for allergy, sinus trouble . All other systems reviewed and negative except where noted in HPI.   PHYSICAL EXAM :    Wt Readings from Last 3 Encounters:  04/10/20 247 lb (112 kg)  03/29/20 247 lb 6.4 oz (112.2 kg)  03/01/20 254 lb (115.2 kg)    BP 120/64    Pulse 68    Ht 5\' 4"  (1.626 m)    Wt 247 lb (112 kg)    LMP 03/20/2020    BMI 42.40 kg/m  Constitutional:  Pleasant obese female in no acute distress. Psychiatric: Normal mood and affect. Behavior is normal. EENT: Pupils normal.  Conjunctivae are normal. No scleral icterus. Neck supple.  Cardiovascular: Normal rate, regular rhythm. No edema Pulmonary/chest: Effort normal and breath sounds normal. No wheezing, rales or rhonchi. Abdominal: Soft, nondistended, nontender. Bowel sounds active throughout. There are no masses palpable. No hepatomegaly. Neurological: Alert and oriented to person place and time. Skin: Skin is warm and dry. No rashes noted.  05/20/2020, NP  04/10/2020, 4:03 PM  Cc:  Referring Provider 04/12/2020, FNP

## 2020-04-10 NOTE — Patient Instructions (Signed)
You have been scheduled for an endoscopy and colonoscopy. Please follow the written instructions given to you at your visit today. Please pick up your prep supplies at the pharmacy within the next 1-3 days. If you use inhalers (even only as needed), please bring them with you on the day of your procedure. Plenvu  We have sent the following medications to your pharmacy for you to pick up at your convenience:Amitiza 24 mcg.  Stop the linzess  If Amitiza is not covered by your insurance, please resume the Linzess and add 17 GM of MIRALAX daily.  If you are age 46 or older, your body mass index should be between 23-30. Your Body mass index is 42.4 kg/m. If this is out of the aforementioned range listed, please consider follow up with your Primary Care Provider.  If you are age 46 or younger, your body mass index should be between 19-25. Your Body mass index is 42.4 kg/m. If this is out of the aformentioned range listed, please consider follow up with your Primary Care Provider.   Due to recent changes in healthcare laws, you may see the results of your imaging and laboratory studies on MyChart before your provider has had a chance to review them.  We understand that in some cases there may be results that are confusing or concerning to you. Not all laboratory results come back in the same time frame and the provider may be waiting for multiple results in order to interpret others.  Please give [place] 48 hours in order for your provider to thoroughly review all the results before contacting the office for clarification of your results.

## 2020-04-11 NOTE — Progress Notes (Signed)
Assessment and plan reviewed 

## 2020-04-22 ENCOUNTER — Other Ambulatory Visit: Payer: Self-pay | Admitting: Nurse Practitioner

## 2020-04-22 DIAGNOSIS — Z6841 Body Mass Index (BMI) 40.0 and over, adult: Secondary | ICD-10-CM

## 2020-04-30 ENCOUNTER — Encounter: Payer: Self-pay | Admitting: Nurse Practitioner

## 2020-04-30 ENCOUNTER — Ambulatory Visit: Payer: Managed Care, Other (non HMO) | Admitting: Nurse Practitioner

## 2020-04-30 ENCOUNTER — Other Ambulatory Visit: Payer: Self-pay

## 2020-04-30 VITALS — BP 140/100 | HR 86 | Temp 98.4°F | Ht 64.0 in | Wt 240.2 lb

## 2020-04-30 DIAGNOSIS — R609 Edema, unspecified: Secondary | ICD-10-CM

## 2020-04-30 DIAGNOSIS — Z6841 Body Mass Index (BMI) 40.0 and over, adult: Secondary | ICD-10-CM

## 2020-04-30 DIAGNOSIS — E6609 Other obesity due to excess calories: Secondary | ICD-10-CM

## 2020-04-30 DIAGNOSIS — I10 Essential (primary) hypertension: Secondary | ICD-10-CM | POA: Diagnosis not present

## 2020-04-30 NOTE — Patient Instructions (Signed)

## 2020-04-30 NOTE — Progress Notes (Signed)
I,Yamilka Roman [employer] as a Education administrator for [employer], FNP.,have documented all relevant documentation on the behalf of Minette Brine, FNP,as directed by  Minette Brine, FNP while in the presence of Minette Brine, [place].   This visit occurred during the SARS-CoV-2 public health emergency.  Safety protocols were in place, including screening questions prior to the visit, additional usage of staff PPE, and extensive cleaning of exam room while observing appropriate contact time as indicated for disinfecting solutions.  Subjective:     Patient ID: Marie Perez , female    DOB: December 20, 1975 , 45 y.o.   MRN: [id number]   Chief Complaint  Patient presents with  . Weight Check    HPI  Patient presents today for a weight check. She is having constipation but has taken miralax to help which caused her to have bowel movement daily since then. She is concerned about her right side of her head swelling  She has been exercising 2 times a week. She is also drinking adequate amounts of water.   Wt Readings from Last 3 Encounters: 04/30/20 : 240 lb 3.2 oz (109 kg) 04/10/20 : 180 lb (81.6 kg) 04/10/20 : 247 lb (112 kg)    Past Medical History:  Diagnosis Date  . Hyperlipidemia   . Hypertension   . Vitamin D deficiency      Family History  Problem Relation Age of Onset  . Hypertension Mother   . Diabetes Father      Current Outpatient Medications:  .  amLODipine (NORVASC) 5 MG tablet, Take 1 tablet (5 mg total) by mouth daily., Disp: 30 tablet, Rfl: 3 .  atorvastatin (LIPITOR) 40 MG tablet, Take 40 mg by mouth daily., Disp: , Rfl:  .  esomeprazole (NEXIUM) 20 MG capsule, Take 1 capsule (20 mg total) by mouth daily., Disp: 30 capsule, Rfl: 2 .  fluticasone (FLONASE) 50 MCG/ACT nasal spray, Place 2 sprays into both nostrils daily as needed. For allergies, Disp: , Rfl:  .  hydrochlorothiazide (HYDRODIURIL) 25 MG tablet, Take 1 tablet (25 mg total) by mouth daily., Disp: 90 tablet, Rfl: 1 .   ibuprofen (ADVIL,MOTRIN) 600 MG tablet, Take 1 tablet (600 mg total) by mouth every 6 (six) hours as needed., Disp: 30 tablet, Rfl: 0 .  Insulin Pen Needle (NOVOFINE PEN NEEDLE) 32G X 6 MM MISC, Use with saxenda, Disp: 50 each, Rfl: 2 .  loratadine (CLARITIN) 10 MG tablet, Take 10 mg by mouth daily., Disp: , Rfl:  .  SAXENDA 18 MG/3ML SOPN, ADMINISTER 3 MG UNDER THE SKIN DAILY, Disp: 9 mL, Rfl: 1   No Known Allergies   Review of Systems  Constitutional: Negative.   Respiratory: Negative.   Cardiovascular: Negative.   Neurological: Negative for dizziness, weakness and headaches (right side of head pain).  Psychiatric/Behavioral: Negative.   All other systems reviewed and are negative.    Today's Vitals   04/30/20 1553 04/30/20 1655  BP: (!) 142/106 (!) 140/100  Pulse: 86   Temp: 98.4 F (36.9 C)   TempSrc: Oral   Weight: 240 lb 3.2 oz (109 kg)   Height: $Remove'5\' 4"'ueRgznA$  (1.626 m)   PainSc: 2     Body mass index is 41.23 kg/m.   Objective:  Physical Exam Vitals reviewed.  Cardiovascular:     Rate and Rhythm: Normal rate and regular rhythm.     Pulses: Normal pulses.     Heart sounds: Normal heart sounds. No murmur heard.   Pulmonary:  Effort: Pulmonary effort is normal. No respiratory distress.     Breath sounds: Normal breath sounds. No wheezing.  Musculoskeletal:        General: Swelling (slight swelling to right side of head, nontender at temporal area) present.     Cervical back: Normal range of motion. No tenderness.  Neurological:     General: No focal deficit present.     Mental Status: She is oriented to person, place, and time.     Cranial Nerves: No cranial nerve deficit.     Motor: No weakness.  Psychiatric:        Mood and Affect: Mood normal.        Behavior: Behavior normal.        Thought Content: Thought content normal.        Judgment: Judgment normal.         Assessment And Plan:     1. Swelling  Will check ESR due to swelling to right side of  head to make sure not temporal arteritis which is least likely due to age - Sed Rate (ESR)  2. Essential hypertension  Blood pressure is elevated came down slightly with repeat. She is encouraged to make sure she is taking her medications regularly and avoiding high salt foods and to stay well hydrated  3. BMI 40.0-44.9, adult (HCC)  Chronic  Discussed healthy diet and regular exercise options   Encouraged to exercise at least 150 minutes per week with 2 days of strength training  She has lost 7 lbs since her last visit     Patient was given opportunity to ask questions. Patient verbalized understanding of the plan and was able to repeat key elements of the plan. All questions were answered to their satisfaction.  Minette Brine, FNP   I, Minette Brine, FNP, have reviewed all documentation for this visit. The documentation on 05/18/20 for the exam, diagnosis, procedures, and orders are all accurate and complete.   IF YOU HAVE BEEN REFERRED TO A SPECIALIST, IT MAY TAKE 1-2 WEEKS TO SCHEDULE/PROCESS THE REFERRAL. IF YOU HAVE NOT HEARD FROM US/SPECIALIST IN TWO WEEKS, PLEASE GIVE [place] A CALL AT [phone].   THE PATIENT IS ENCOURAGED TO PRACTICE SOCIAL DISTANCING DUE TO THE COVID-19 PANDEMIC.

## 2020-05-01 LAB — SEDIMENTATION RATE: Sed Rate: 2 mm/hr (ref 0–32)

## 2020-05-21 ENCOUNTER — Other Ambulatory Visit: Payer: Self-pay | Admitting: Nurse Practitioner

## 2020-05-21 ENCOUNTER — Other Ambulatory Visit: Payer: Self-pay

## 2020-05-21 ENCOUNTER — Encounter: Payer: Self-pay | Admitting: Nurse Practitioner

## 2020-05-21 DIAGNOSIS — I10 Essential (primary) hypertension: Secondary | ICD-10-CM

## 2020-05-21 MED ORDER — ATORVASTATIN CALCIUM 20 MG PO TABS
20.0000 mg | ORAL_TABLET | Freq: Every day | ORAL | 4 refills | Status: DC
  Filled 2021-03-26: qty 90, 90d supply, fill #0
  Filled 2021-06-25: qty 90, 90d supply, fill #1

## 2020-05-21 MED ORDER — ESOMEPRAZOLE MAGNESIUM 20 MG PO CPDR: DELAYED_RELEASE_CAPSULE | ORAL | 2 refills | Status: DC

## 2020-05-21 MED ORDER — AMLODIPINE BESYLATE 5 MG PO TABS: 5.0000 mg | ORAL_TABLET | Freq: Every day | ORAL | 2 refills | Status: DC

## 2020-05-21 MED ORDER — HYDROCHLOROTHIAZIDE 25 MG PO TABS: 25.0000 mg | ORAL_TABLET | Freq: Every day | ORAL | 2 refills | Status: DC

## 2020-05-21 NOTE — Telephone Encounter (Signed)
Please send a refill of her medications and have her to at least check her blood pressure at home to ensure the readings are better before she sees Raman in June.

## 2020-05-23 ENCOUNTER — Telehealth: Payer: Self-pay

## 2020-05-29 ENCOUNTER — Encounter: Payer: Self-pay | Admitting: Nurse Practitioner

## 2020-05-29 ENCOUNTER — Other Ambulatory Visit: Payer: Self-pay

## 2020-05-29 NOTE — Telephone Encounter (Signed)
Prior auth done for saxenda. Waiting on a response from the pt's insurance company.

## 2020-06-27 ENCOUNTER — Encounter: Payer: Managed Care, Other (non HMO) | Admitting: Internal Medicine

## 2020-07-04 ENCOUNTER — Other Ambulatory Visit: Payer: Self-pay | Admitting: Nurse Practitioner

## 2020-07-04 DIAGNOSIS — Z6841 Body Mass Index (BMI) 40.0 and over, adult: Secondary | ICD-10-CM

## 2020-07-10 ENCOUNTER — Ambulatory Visit: Payer: Managed Care, Other (non HMO) | Admitting: Nurse Practitioner

## 2020-07-18 ENCOUNTER — Encounter: Payer: Self-pay | Admitting: Nurse Practitioner

## 2020-07-18 ENCOUNTER — Ambulatory Visit: Payer: Managed Care, Other (non HMO) | Admitting: Nurse Practitioner

## 2020-07-18 ENCOUNTER — Other Ambulatory Visit: Payer: Self-pay

## 2020-07-18 VITALS — BP 130/80 | HR 78 | Temp 98.5°F | Ht 64.0 in | Wt 241.6 lb

## 2020-07-18 DIAGNOSIS — Z6841 Body Mass Index (BMI) 40.0 and over, adult: Secondary | ICD-10-CM | POA: Diagnosis not present

## 2020-07-18 NOTE — Patient Instructions (Signed)
Obesity, Adult Obesity is having too much body fat. Being obese means that your weight is morethan what is healthy for you. BMI is a number that explains how much body fat you have. If you have a BMI of 30 or more, you are obese. Obesity is often caused by eating or drinking morecalories than your body uses. Changing your lifestyle can help you lose weight. Obesity can cause serious health problems, such as: Stroke. Coronary artery disease (CAD). Type 2 diabetes. Some types of cancer, including cancers of the colon, breast, uterus, and gallbladder. Osteoarthritis. High blood pressure (hypertension). High cholesterol. Sleep apnea. Gallbladder stones. Infertility problems. What are the causes? Eating meals each day that are high in calories, sugar, and fat. Being born with genes that may make you more likely to become obese. Having a medical condition that causes obesity. Taking certain medicines. Sitting a lot (having a sedentary lifestyle). Not getting enough sleep. Drinking a lot of drinks that have sugar in them. What increases the risk? Having a family history of obesity. Being an African American woman. Being a Hispanic man. Living in an area with limited access to: Parks, recreation centers, or sidewalks. Healthy food choices, such as grocery stores and farmers' markets. What are the signs or symptoms? The main sign is having too much body fat. How is this treated? Treatment for this condition often includes changing your lifestyle. Treatment may include: Changing your diet. This may include making a healthy meal plan. Exercise. This may include activity that causes your heart to beat faster (aerobic exercise) and strength training. Work with your doctor to design a program that works for you. Medicine to help you lose weight. This may be used if you are not able to lose 1 pound a week after 6 weeks of healthy eating and more exercise. Treating conditions that cause the  obesity. Surgery. Options may include gastric banding and gastric bypass. This may be done if: Other treatments have not helped to improve your condition. You have a BMI of 40 or higher. You have life-threatening health problems related to obesity. Follow these instructions at home: Eating and drinking  Follow advice from your doctor about what to eat and drink. Your doctor may tell you to: Limit fast food, sweets, and processed snack foods. Choose low-fat options. For example, choose low-fat milk instead of whole milk. Eat 5 or more servings of fruits or vegetables each day. Eat at home more often. This gives you more control over what you eat. Choose healthy foods when you eat out. Learn to read food labels. This will help you learn how much food is in 1 serving. Keep low-fat snacks available. Avoid drinks that have a lot of sugar in them. These include soda, fruit juice, iced tea with sugar, and flavored milk. Drink enough water to keep your pee (urine) pale yellow. Do not go on fad diets.  Physical activity Exercise often, as told by your doctor. Most adults should get up to 150 minutes of moderate-intensity exercise every week.Ask your doctor: What types of exercise are safe for you. How often you should exercise. Warm up and stretch before being active. Do slow stretching after being active (cool down). Rest between times of being active. Lifestyle Work with your doctor and a food expert (dietitian) to set a weight-loss goal that is best for you. Limit your screen time. Find ways to reward yourself that do not involve food. Do not drink alcohol if: Your doctor tells you not to drink.   You are pregnant, may be pregnant, or are planning to become pregnant. If you drink alcohol: Limit how much you use to: 0-1 drink a day for women. 0-2 drinks a day for men. Be aware of how much alcohol is in your drink. In the [place] one drink equals one 12 oz bottle of beer (355 mL), one 5 oz  glass of wine (148 mL), or one 1 oz glass of hard liquor (44 mL). General instructions Keep a weight-loss journal. This can help you keep track of: The food that you eat. How much exercise you get. Take over-the-counter and prescription medicines only as told by your doctor. Take vitamins and supplements only as told by your doctor. Think about joining a support group. Keep all follow-up visits as told by your doctor. This is important. Contact a doctor if: You cannot meet your weight loss goal after you have changed your diet and lifestyle for 6 weeks. Get help right away if you: Are having trouble breathing. Are having thoughts of harming yourself. Summary Obesity is having too much body fat. Being obese means that your weight is more than what is healthy for you. Work with your doctor to set a weight-loss goal. Get regular exercise as told by your doctor. This information is not intended to replace advice given to you by your health care provider. Make sure you discuss any questions you have with your healthcare provider. Document Revised: 09/10/2017 Document Reviewed: 09/10/2017 Elsevier Patient Education  2022 Elsevier Inc.  

## 2020-07-18 NOTE — Progress Notes (Signed)
I,Tianna Badgett,acting as a Neurosurgeon for [employer], NP.,have documented all relevant documentation on the behalf of [employer], NP,as directed by  Charlesetta Ivory, NP while in the presence of Charlesetta Ivory, NP.  This visit occurred during the SARS-CoV-2 public health emergency.  Safety protocols were in place, including screening questions prior to the visit, additional usage of staff PPE, and extensive cleaning of exam room while observing appropriate contact time as indicated for disinfecting solutions.  Subjective:     Patient ID: Marie Perez , female    DOB: February 23, 1975 , 45 y.o.   MRN: [id number]   Chief Complaint  Patient presents with   Obesity         HPI  Patient is here for weight check. She is currently on saxenda and tolerating it well.  No side-effects. She is getting her wellness check today and getting her lab works there.  Wt Readings from Last 3 Encounters: 07/18/20 : 241 lb 9.6 oz (109.6 kg) 04/30/20 : 240 lb 3.2 oz (109 kg)  She has started eating breakfast. She has cut out a lot of meats. She eats [place] and ground beef.  Exercise: she is trying to walk.     Past Medical History:  Diagnosis Date   Hyperlipidemia    Hypertension    Vitamin D deficiency      Family History  Problem Relation Age of Onset   Hypertension Mother    Diabetes Father      Current Outpatient Medications:    amLODipine (NORVASC) 5 MG tablet, Take 1 tablet (5 mg total) by mouth daily., Disp: 30 tablet, Rfl: 2   atorvastatin (LIPITOR) 40 MG tablet, Take 1 tablet (40 mg total) by mouth daily., Disp: 30 tablet, Rfl: 2   esomeprazole (NEXIUM) 20 MG capsule, TAKE 1 CAPSULE(20 MG) BY MOUTH DAILY, Disp: 30 capsule, Rfl: 2   fluticasone (FLONASE) 50 MCG/ACT nasal spray, Place 2 sprays into both nostrils daily as needed. For allergies, Disp: , Rfl:    hydrochlorothiazide (HYDRODIURIL) 25 MG tablet, Take 1 tablet (25 mg total) by mouth daily., Disp: 30 tablet, Rfl:  2   ibuprofen (ADVIL,MOTRIN) 600 MG tablet, Take 1 tablet (600 mg total) by mouth every 6 (six) hours as needed., Disp: 30 tablet, Rfl: 0   Insulin Pen Needle (NOVOFINE PEN NEEDLE) 32G X 6 MM MISC, Use with saxenda, Disp: 50 each, Rfl: 2   loratadine (CLARITIN) 10 MG tablet, Take 10 mg by mouth daily., Disp: , Rfl:    SAXENDA 18 MG/3ML SOPN, ADMINISTER 3 MG UNDER THE SKIN DAILY, Disp: 9 mL, Rfl: 1   No Known Allergies   Review of Systems  Constitutional:  Negative for chills and fatigue.  HENT:  Negative for congestion.   Respiratory:  Negative for cough, shortness of breath and wheezing.   Cardiovascular:  Negative for chest pain and palpitations.  Endocrine: Negative for polydipsia, polyphagia and polyuria.  Neurological:  Negative for dizziness, weakness and headaches.    Today's Vitals   07/18/20 1445  BP: 130/80  Pulse: 78  Temp: 98.5 F (36.9 C)  TempSrc: Oral  Weight: 241 lb 9.6 oz (109.6 kg)  Height: 5\' 4"  (1.626 m)   Body mass index is 41.47 kg/m.  Wt Readings from Last 3 Encounters:  07/18/20 241 lb 9.6 oz (109.6 kg)  04/30/20 240 lb 3.2 oz (109 kg)  04/10/20 180 lb (81.6 kg)    Objective:  Physical Exam Constitutional:      Appearance: Normal  appearance. She is obese.  Cardiovascular:     Rate and Rhythm: Normal rate and regular rhythm.     Pulses: Normal pulses.     Heart sounds: Normal heart sounds. No murmur heard. Pulmonary:     Effort: No respiratory distress.     Breath sounds: Normal breath sounds.  Skin:    General: Skin is warm and dry.     Capillary Refill: Capillary refill takes less than 2 seconds.  Neurological:     Mental Status: She is alert and oriented to person, place, and time.        Assessment And Plan:     1. BMI 40.0-44.9, adult [facility])  Advised patient on a healthy diet including avoiding fast food and red meats. Increase the intake of lean meats including grilled chicken and [place].  Drink a lot of water. Decrease intake of  fatty foods. Exercise for 30-45 min. 4-5 a week to decrease the risk of cardiac event.  -She is tolerating the saxenda well. No side effects. Will continue -Follow up in 2 month   The patient was encouraged to call or send a message through MyChart for any questions or concerns.   Follow up: if symptoms persist or do not get better.   Side effects and appropriate use of all the medication(s) were discussed with the patient today. Patient advised to use the medication(s) as directed by their healthcare provider. The patient was encouraged to read, review, and understand all associated package inserts and contact our office with any questions or concerns. The patient accepts the risks of the treatment plan and had an opportunity to ask questions.    Patient was given opportunity to ask questions. Patient verbalized understanding of the plan and was able to repeat key elements of the plan. All questions were answered to their satisfaction.  Raman Kalene Cutler, DNP   I, Raman Nameer Summer have reviewed all documentation for this visit. The documentation on 07/18/20 for the exam, diagnosis, procedures, and orders are all accurate and complete.     IF YOU HAVE BEEN REFERRED TO A SPECIALIST, IT MAY TAKE 1-2 WEEKS TO SCHEDULE/PROCESS THE REFERRAL. IF YOU HAVE NOT HEARD FROM US/SPECIALIST IN TWO WEEKS, PLEASE GIVE [place] A CALL AT [phone].   THE PATIENT IS ENCOURAGED TO PRACTICE SOCIAL DISTANCING DUE TO THE COVID-19 PANDEMIC.

## 2020-10-25 ENCOUNTER — Other Ambulatory Visit: Payer: Self-pay | Admitting: Obstetrics and Gynecology

## 2020-10-25 DIAGNOSIS — Z1231 Encounter for screening mammogram for malignant neoplasm of breast: Secondary | ICD-10-CM

## 2020-11-09 ENCOUNTER — Telehealth: Payer: Self-pay

## 2020-11-09 NOTE — Telephone Encounter (Signed)
Prior auth completed for saxenda, waiting on response from the [employer].

## 2020-12-19 ENCOUNTER — Ambulatory Visit
Admission: RE | Admit: 2020-12-19 | Discharge: 2020-12-19 | Disposition: A | Discharge status: Home / Self Care | Payer: Managed Care, Other (non HMO) | Source: Ambulatory Visit | Attending: Obstetrics and Gynecology | Admitting: Obstetrics and Gynecology

## 2020-12-19 ENCOUNTER — Other Ambulatory Visit: Payer: Self-pay

## 2020-12-19 ENCOUNTER — Ambulatory Visit: Payer: Managed Care, Other (non HMO)

## 2020-12-19 DIAGNOSIS — Z1231 Encounter for screening mammogram for malignant neoplasm of breast: Secondary | ICD-10-CM

## 2021-02-20 ENCOUNTER — Other Ambulatory Visit (HOSPITAL_BASED_OUTPATIENT_CLINIC_OR_DEPARTMENT_OTHER): Payer: Self-pay

## 2021-02-20 MED ORDER — SEMAGLUTIDE-WEIGHT MANAGEMENT 0.25 MG/0.5ML ~~LOC~~ SOAJ
SUBCUTANEOUS | 2 refills | Status: DC
  Filled 2021-03-27: qty 2, 28d supply, fill #0

## 2021-02-20 MED ORDER — OZEMPIC (0.25 OR 0.5 MG/DOSE) 2 MG/1.5ML ~~LOC~~ SOPN: PEN_INJECTOR | SUBCUTANEOUS | 1 refills | Status: DC

## 2021-02-20 MED ORDER — VALSARTAN-HYDROCHLOROTHIAZIDE 160-12.5 MG PO TABS: ORAL_TABLET | ORAL | 4 refills | Status: DC

## 2021-02-20 MED ORDER — LINACLOTIDE 290 MCG PO CAPS
ORAL_CAPSULE | ORAL | 3 refills | Status: DC
  Filled 2021-03-26: qty 90, 90d supply, fill #0
  Filled 2021-06-30: qty 90, 90d supply, fill #1

## 2021-02-20 MED ORDER — SAXENDA 18 MG/3ML ~~LOC~~ SOPN: PEN_INJECTOR | SUBCUTANEOUS | 1 refills | Status: DC

## 2021-02-20 MED ORDER — LUBIPROSTONE 24 MCG PO CAPS: ORAL_CAPSULE | ORAL | 4 refills | Status: DC

## 2021-02-20 MED ORDER — CETIRIZINE HCL 10 MG PO TABS
ORAL_TABLET | ORAL | 3 refills | Status: AC
  Filled 2021-07-05: qty 90, 90d supply, fill #0

## 2021-02-20 MED ORDER — OMEPRAZOLE 40 MG PO CPDR
DELAYED_RELEASE_CAPSULE | ORAL | 3 refills | Status: DC
  Filled 2021-03-26: qty 90, 90d supply, fill #0
  Filled 2021-07-05: qty 90, 90d supply, fill #1

## 2021-02-20 MED ORDER — FLUTICASONE PROPIONATE 50 MCG/ACT NA SUSP: NASAL | 3 refills | Status: DC

## 2021-02-25 ENCOUNTER — Other Ambulatory Visit: Payer: Self-pay

## 2021-03-25 ENCOUNTER — Other Ambulatory Visit (HOSPITAL_BASED_OUTPATIENT_CLINIC_OR_DEPARTMENT_OTHER): Payer: Self-pay

## 2021-03-25 MED ORDER — OZEMPIC (0.25 OR 0.5 MG/DOSE) 2 MG/1.5ML ~~LOC~~ SOPN
PEN_INJECTOR | SUBCUTANEOUS | 1 refills | Status: DC
  Filled 2021-03-25: qty 1.5, 42d supply, fill #0

## 2021-03-26 ENCOUNTER — Other Ambulatory Visit (HOSPITAL_BASED_OUTPATIENT_CLINIC_OR_DEPARTMENT_OTHER): Payer: Self-pay

## 2021-03-26 MED ORDER — MONTELUKAST SODIUM 10 MG PO TABS
ORAL_TABLET | ORAL | 4 refills | Status: DC
  Filled 2021-03-26: qty 45, 90d supply, fill #0
  Filled 2021-05-14 – 2021-05-22 (×2): qty 45, 90d supply, fill #1
  Filled 2022-01-06: qty 45, 90d supply, fill #2
  Filled 2022-03-09: qty 45, 90d supply, fill #3

## 2021-03-26 MED ORDER — VALSARTAN-HYDROCHLOROTHIAZIDE 160-12.5 MG PO TABS
ORAL_TABLET | ORAL | 4 refills | Status: DC
  Filled 2021-03-26: qty 90, 90d supply, fill #0
  Filled 2021-07-05: qty 90, 90d supply, fill #1
  Filled 2021-09-30: qty 90, 90d supply, fill #2
  Filled 2022-01-06: qty 90, 90d supply, fill #3

## 2021-03-27 ENCOUNTER — Other Ambulatory Visit (HOSPITAL_BASED_OUTPATIENT_CLINIC_OR_DEPARTMENT_OTHER): Payer: Self-pay

## 2021-03-28 ENCOUNTER — Other Ambulatory Visit (HOSPITAL_BASED_OUTPATIENT_CLINIC_OR_DEPARTMENT_OTHER): Payer: Self-pay

## 2021-03-29 ENCOUNTER — Other Ambulatory Visit (HOSPITAL_BASED_OUTPATIENT_CLINIC_OR_DEPARTMENT_OTHER): Payer: Self-pay

## 2021-04-01 ENCOUNTER — Other Ambulatory Visit (HOSPITAL_BASED_OUTPATIENT_CLINIC_OR_DEPARTMENT_OTHER): Payer: Self-pay

## 2021-04-29 ENCOUNTER — Other Ambulatory Visit (HOSPITAL_BASED_OUTPATIENT_CLINIC_OR_DEPARTMENT_OTHER): Payer: Self-pay

## 2021-04-30 ENCOUNTER — Other Ambulatory Visit (HOSPITAL_BASED_OUTPATIENT_CLINIC_OR_DEPARTMENT_OTHER): Payer: Self-pay

## 2021-04-30 MED ORDER — OZEMPIC (0.25 OR 0.5 MG/DOSE) 2 MG/1.5ML ~~LOC~~ SOPN
PEN_INJECTOR | SUBCUTANEOUS | 1 refills | Status: DC
  Filled 2021-04-30: qty 3, 56d supply, fill #0

## 2021-05-01 ENCOUNTER — Other Ambulatory Visit (HOSPITAL_BASED_OUTPATIENT_CLINIC_OR_DEPARTMENT_OTHER): Payer: Self-pay

## 2021-05-02 ENCOUNTER — Other Ambulatory Visit (HOSPITAL_BASED_OUTPATIENT_CLINIC_OR_DEPARTMENT_OTHER): Payer: Self-pay

## 2021-05-03 ENCOUNTER — Encounter (HOSPITAL_BASED_OUTPATIENT_CLINIC_OR_DEPARTMENT_OTHER): Payer: Self-pay

## 2021-05-03 ENCOUNTER — Other Ambulatory Visit (HOSPITAL_BASED_OUTPATIENT_CLINIC_OR_DEPARTMENT_OTHER): Payer: Self-pay

## 2021-05-03 MED ORDER — WEGOVY 0.5 MG/0.5ML ~~LOC~~ SOAJ
SUBCUTANEOUS | 0 refills | Status: DC
  Filled 2021-05-03: qty 2, 28d supply, fill #0

## 2021-05-14 ENCOUNTER — Other Ambulatory Visit (HOSPITAL_BASED_OUTPATIENT_CLINIC_OR_DEPARTMENT_OTHER): Payer: Self-pay

## 2021-05-20 ENCOUNTER — Other Ambulatory Visit (HOSPITAL_BASED_OUTPATIENT_CLINIC_OR_DEPARTMENT_OTHER): Payer: Self-pay

## 2021-05-21 ENCOUNTER — Other Ambulatory Visit (HOSPITAL_BASED_OUTPATIENT_CLINIC_OR_DEPARTMENT_OTHER): Payer: Self-pay

## 2021-05-22 ENCOUNTER — Other Ambulatory Visit (HOSPITAL_BASED_OUTPATIENT_CLINIC_OR_DEPARTMENT_OTHER): Payer: Self-pay

## 2021-05-23 ENCOUNTER — Encounter (HOSPITAL_BASED_OUTPATIENT_CLINIC_OR_DEPARTMENT_OTHER): Payer: Self-pay | Admitting: Pharmacist

## 2021-05-23 ENCOUNTER — Other Ambulatory Visit (HOSPITAL_BASED_OUTPATIENT_CLINIC_OR_DEPARTMENT_OTHER): Payer: Self-pay

## 2021-05-23 MED ORDER — MONTELUKAST SODIUM 10 MG PO TABS
ORAL_TABLET | ORAL | 3 refills | Status: DC
  Filled 2021-05-23 – 2021-07-05 (×2): qty 90, 90d supply, fill #0
  Filled 2021-10-01: qty 90, 90d supply, fill #1
  Filled 2022-03-19: qty 56, 56d supply, fill #2
  Filled 2022-03-19: qty 34, 34d supply, fill #2
  Filled 2022-03-19: qty 90, 90d supply, fill #2

## 2021-05-29 ENCOUNTER — Other Ambulatory Visit (HOSPITAL_BASED_OUTPATIENT_CLINIC_OR_DEPARTMENT_OTHER): Payer: Self-pay

## 2021-05-29 MED ORDER — WEGOVY 0.5 MG/0.5ML ~~LOC~~ SOAJ: SUBCUTANEOUS | 0 refills | Status: DC

## 2021-05-29 MED ORDER — WEGOVY 1 MG/0.5ML ~~LOC~~ SOAJ
SUBCUTANEOUS | 1 refills | Status: DC
Start: 2021-05-29 — End: 2022-08-20
  Filled 2021-05-29: qty 2, 28d supply, fill #0
  Filled 2021-09-30 – 2022-01-17 (×4): qty 2, 28d supply, fill #1

## 2021-06-25 ENCOUNTER — Other Ambulatory Visit (HOSPITAL_BASED_OUTPATIENT_CLINIC_OR_DEPARTMENT_OTHER): Payer: Self-pay

## 2021-06-25 MED ORDER — WEGOVY 1.7 MG/0.75ML ~~LOC~~ SOAJ
SUBCUTANEOUS | 0 refills | Status: DC
  Filled 2021-06-25: qty 3, 28d supply, fill #0

## 2021-07-01 ENCOUNTER — Other Ambulatory Visit (HOSPITAL_BASED_OUTPATIENT_CLINIC_OR_DEPARTMENT_OTHER): Payer: Self-pay

## 2021-07-05 ENCOUNTER — Other Ambulatory Visit (HOSPITAL_BASED_OUTPATIENT_CLINIC_OR_DEPARTMENT_OTHER): Payer: Self-pay

## 2021-07-15 ENCOUNTER — Other Ambulatory Visit (HOSPITAL_BASED_OUTPATIENT_CLINIC_OR_DEPARTMENT_OTHER): Payer: Self-pay

## 2021-07-15 MED ORDER — WEGOVY 2.4 MG/0.75ML ~~LOC~~ SOAJ
SUBCUTANEOUS | 5 refills | Status: DC
  Filled 2021-07-15: qty 3, 28d supply, fill #0
  Filled 2021-08-08: qty 3, 28d supply, fill #1
  Filled 2021-09-05: qty 3, 28d supply, fill #2
  Filled 2021-09-30 – 2022-02-12 (×4): qty 3, 28d supply, fill #3
  Filled 2022-03-19: qty 3, 28d supply, fill #4

## 2021-08-08 ENCOUNTER — Other Ambulatory Visit (HOSPITAL_BASED_OUTPATIENT_CLINIC_OR_DEPARTMENT_OTHER): Payer: Self-pay

## 2021-09-02 ENCOUNTER — Other Ambulatory Visit (HOSPITAL_BASED_OUTPATIENT_CLINIC_OR_DEPARTMENT_OTHER): Payer: Self-pay

## 2021-09-02 MED ORDER — FLUCONAZOLE 150 MG PO TABS
ORAL_TABLET | ORAL | 0 refills | Status: DC
Start: 2021-09-01 — End: 2021-10-18
  Filled 2021-09-02: qty 1, 1d supply, fill #0

## 2021-09-02 MED ORDER — PREDNISONE 20 MG PO TABS
ORAL_TABLET | ORAL | 0 refills | Status: DC
  Filled 2021-09-02: qty 10, 5d supply, fill #0

## 2021-09-05 ENCOUNTER — Other Ambulatory Visit (HOSPITAL_BASED_OUTPATIENT_CLINIC_OR_DEPARTMENT_OTHER): Payer: Self-pay

## 2021-09-30 ENCOUNTER — Other Ambulatory Visit (HOSPITAL_BASED_OUTPATIENT_CLINIC_OR_DEPARTMENT_OTHER): Payer: Self-pay

## 2021-10-01 ENCOUNTER — Other Ambulatory Visit (HOSPITAL_BASED_OUTPATIENT_CLINIC_OR_DEPARTMENT_OTHER): Payer: Self-pay

## 2021-10-01 MED ORDER — OMEPRAZOLE 40 MG PO CPDR
DELAYED_RELEASE_CAPSULE | ORAL | 0 refills | Status: DC
  Filled 2021-10-01: qty 90, 90d supply, fill #0

## 2021-10-01 MED ORDER — ATORVASTATIN CALCIUM 20 MG PO TABS
20.0000 mg | ORAL_TABLET | Freq: Every day | ORAL | 0 refills | Status: DC
  Filled 2021-10-01: qty 90, 90d supply, fill #0

## 2021-10-01 MED ORDER — LINZESS 290 MCG PO CAPS
ORAL_CAPSULE | ORAL | 0 refills | Status: DC
  Filled 2021-10-01: qty 90, 90d supply, fill #0

## 2021-10-04 ENCOUNTER — Other Ambulatory Visit (HOSPITAL_BASED_OUTPATIENT_CLINIC_OR_DEPARTMENT_OTHER): Payer: Self-pay

## 2021-10-04 MED ORDER — INFLUENZA VAC SPLIT QUAD 0.5 ML IM SUSY
PREFILLED_SYRINGE | INTRAMUSCULAR | 0 refills | Status: DC
  Filled 2021-10-04: qty 0.5, 1d supply, fill #0

## 2021-10-17 ENCOUNTER — Other Ambulatory Visit (HOSPITAL_BASED_OUTPATIENT_CLINIC_OR_DEPARTMENT_OTHER): Payer: Self-pay

## 2021-10-18 ENCOUNTER — Telehealth: Payer: 59 | Admitting: Physician Assistant

## 2021-10-18 ENCOUNTER — Other Ambulatory Visit (HOSPITAL_BASED_OUTPATIENT_CLINIC_OR_DEPARTMENT_OTHER): Payer: Self-pay

## 2021-10-18 DIAGNOSIS — B3731 Acute candidiasis of vulva and vagina: Secondary | ICD-10-CM

## 2021-10-18 MED ORDER — FLUCONAZOLE 150 MG PO TABS
150.0000 mg | ORAL_TABLET | ORAL | 0 refills | Status: DC | PRN
  Filled 2021-10-18: qty 2, 6d supply, fill #0

## 2021-10-18 MED ORDER — TERCONAZOLE 0.4 % VA CREA
TOPICAL_CREAM | VAGINAL | 0 refills | Status: DC
  Filled 2021-10-18: qty 45, 7d supply, fill #0

## 2021-10-18 NOTE — Progress Notes (Signed)
Virtual Visit Consent   Marie Perez, you are scheduled for a virtual visit with a [facility] provider today. Just as with appointments in the office, your consent must be obtained to participate. Your consent will be active for this visit and any virtual visit you may have with one of our providers in the next 365 days. If you have a MyChart account, a copy of this consent can be sent to you electronically.  As this is a virtual visit, video technology does not allow for your provider to perform a traditional examination. This may limit your provider's ability to fully assess your condition. If your provider identifies any concerns that need to be evaluated in person or the need to arrange testing (such as labs, EKG, etc.), we will make arrangements to do so. Although advances in technology are sophisticated, we cannot ensure that it will always work on either your end or our end. If the connection with a video visit is poor, the visit may have to be switched to a telephone visit. With either a video or telephone visit, we are not always able to ensure that we have a secure connection.  By engaging in this virtual visit, you consent to the provision of healthcare and authorize for your insurance to be billed (if applicable) for the services provided during this visit. Depending on your insurance coverage, you may receive a charge related to this service.  I need to obtain your verbal consent now. Are you willing to proceed with your visit today? Marie Perez has provided verbal consent on 10/18/2021 for a virtual visit (video or telephone). Marie Loveless, PA-C  Date: 10/18/2021 10:24 AM  Virtual Visit via Video Note   I, Marie Perez, connected with  Marie Perez  ([id number], April 09, 1975) on 10/18/21 at 10:15 AM EDT by a video-enabled telemedicine application and verified that I am speaking with the correct person using two identifiers.  Location: Patient: Virtual Visit  Location Patient: Home Provider: Virtual Visit Location Provider: Home Office   I discussed the limitations of evaluation and management by telemedicine and the availability of in person appointments. The patient expressed understanding and agreed to proceed.    History of Present Illness: Marie Perez is a 46 y.o. who identifies as a female who was assigned female at birth, and is being seen today for vaginal discharge.  HPI: Vaginal Discharge The patient's primary symptoms include genital itching and vaginal discharge. The patient's pertinent negatives include no genital odor or pelvic pain. This is a new problem. The current episode started in the past 7 days. The problem occurs constantly. The problem has been gradually worsening. The patient is experiencing no pain. Associated symptoms include frequency. Pertinent negatives include no abdominal pain, back pain, chills, diarrhea, discolored urine, dysuria, fever, hematuria, nausea, urgency or vomiting. The vaginal discharge was thick and mucoid. There has been no bleeding. She has not been passing clots. She has not been passing tissue. Nothing aggravates the symptoms. She has tried nothing for the symptoms. The treatment provided no relief.     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    Allergies: No Known Allergies Medications:  Current Outpatient Medications:    fluconazole (DIFLUCAN) 150 MG tablet, Take 1 tablet (150 mg total) by mouth every 3 (three) days as needed., Disp: 2 tablet, Rfl: 0   terconazole (TERAZOL 7) 0.4 % vaginal cream, Apply topically to external tissues as directed for 5-7 days as  needed, Disp: 45 g, Rfl: 0   amLODipine (NORVASC) 5 MG tablet, Take 1 tablet (5 mg total) by mouth daily., Disp: 30 tablet, Rfl: 2   atorvastatin (LIPITOR) 20 MG tablet, Take 1 tablet (20 mg total) by mouth daily., Disp: 90 tablet, Rfl: 0   cetirizine (ZYRTEC) 10 MG tablet, Take 1 tablet by mouth every day, Disp: 90 tablet, Rfl: 3   esomeprazole (NEXIUM) 20 MG capsule, TAKE 1 CAPSULE(20 MG) BY MOUTH DAILY, Disp: 30 capsule, Rfl: 2   fluticasone (FLONASE) 50 MCG/ACT nasal spray, Place 2 sprays into both nostrils daily as needed. For allergies, Disp: , Rfl:    fluticasone (FLONASE) 50 MCG/ACT nasal spray, Use 1 spray in each nostril once daily, Disp: 48 g, Rfl: 3   hydrochlorothiazide (HYDRODIURIL) 25 MG tablet, Take 1 tablet (25 mg total) by mouth daily., Disp: 30 tablet, Rfl: 2   ibuprofen (ADVIL,MOTRIN) 600 MG tablet, Take 1 tablet (600 mg total) by mouth every 6 (six) hours as needed., Disp: 30 tablet, Rfl: 0   influenza vac split quadrivalent PF (FLUARIX) 0.5 ML injection, Inject into the muscle., Disp: 0.5 mL, Rfl: 0   Insulin Pen Needle (NOVOFINE PEN NEEDLE) 32G X 6 MM MISC, Use with saxenda, Disp: 50 each, Rfl: 2   linaclotide (LINZESS) 290 MCG CAPS capsule, Take 1 capsule by mouth once daily with first meal, Disp: 90 capsule, Rfl: 0   Liraglutide -Weight Management (SAXENDA) 18 MG/3ML SOPN, Inject 0.6 mg under the skin every day for 1 week,then increase dose by 0.6 mg every week up to 1.8 mg a day, Disp: 3 mL, Rfl: 1   loratadine (CLARITIN) 10 MG tablet, Take 10 mg by mouth daily., Disp: , Rfl:    lubiprostone (AMITIZA) 24 MCG capsule, Take 1 capsule by mouth twice daily with a meal, Disp: 60 capsule, Rfl: 4   montelukast (SINGULAIR) 10 MG tablet, 1 tablet Orally every other day 90 days, Disp: 45 tablet, Rfl: 4   montelukast (SINGULAIR) 10 MG tablet, 1 tablet Orally Once a day 90 days, Disp: 90 tablet, Rfl: 3   omeprazole (PRILOSEC) 40 MG capsule, Take 1 capsule by mouth every day 30 minutes  before breakfast., Disp: 90 capsule, Rfl: 0   predniSONE (DELTASONE) 20 MG tablet, Take 2 tablets by mouth every day for 5 days, Disp: 10 tablet, Rfl: 0   SAXENDA 18 MG/3ML SOPN, ADMINISTER 3 MG UNDER THE SKIN DAILY, Disp: 9 mL, Rfl: 1   Semaglutide,0.25 or 0.5MG /DOS, (OZEMPIC, 0.25 OR 0.5 MG/DOSE,) 2 MG/1.5ML SOPN, Inject 0.25 mg under the skin once weekly for 4 weeks,then increase to 0.5 mg once weekly for 2 weeks, Disp: 1.5 mL, Rfl: 1   Semaglutide,0.25 or 0.5MG /DOS, (OZEMPIC, 0.25 OR 0.5 MG/DOSE,) 2 MG/1.5ML SOPN, Titrate 0.25 mg once weekly for 4 weeks then 0.5 mg once weekly for 2 weeks Subcutaneous once a week 30 days, Disp: 1.5 mL, Rfl: 1   Semaglutide,0.25 or 0.5MG /DOS, (OZEMPIC, 0.25 OR 0.5 MG/DOSE,) 2 MG/1.5ML SOPN, 0.5mg  weekly Subcutaneous once a week, Disp: 1.5 mL, Rfl: 1   Semaglutide-Weight Management (WEGOVY) 0.5 MG/0.5ML SOAJ, 0.5 mL Subcutaneous Once a week 30 day(s), Disp: 2 mL, Rfl: 0   Semaglutide-Weight Management (  WEGOVY) 1 MG/0.5ML SOAJ, 1 mL Subcutaneous Once a week 30 day(s) once a week, Disp: 2 mL, Rfl: 1   Semaglutide-Weight Management (WEGOVY) 1.7 MG/0.75ML SOAJ, 0.75 ml Subcutaneous once a week 30 day(s), Disp: 3 mL, Rfl: 0   Semaglutide-Weight Management (WEGOVY) 2.4 MG/0.75ML SOAJ, Inject 0.75 ml Subcutaneous once a week 30 days, Disp: 3 mL, Rfl: 5   Semaglutide-Weight Management 0.25 MG/0.5ML SOAJ, Inject 0.25 mg (0.66ml) under the skin as directed, Disp: 2 mL, Rfl: 2   valsartan-hydrochlorothiazide (DIOVAN-HCT) 160-12.5 MG tablet, Take 1 tablet by mouth every day, Disp: 90 tablet, Rfl: 4   valsartan-hydrochlorothiazide (DIOVAN-HCT) 160-12.5 MG tablet, 1 tablet Orally Once a day 90 days, Disp: 90 tablet, Rfl: 4  Observations/Objective: Patient is well-developed, well-nourished in no acute distress.  Resting comfortably at home.  Head is normocephalic, atraumatic.  No labored breathing.  Speech is clear and coherent with logical content.  Patient is alert and  oriented at baseline.    Assessment and Plan: 1. Yeast vaginitis - fluconazole (DIFLUCAN) 150 MG tablet; Take 1 tablet (150 mg total) by mouth every 3 (three) days as needed.  Dispense: 2 tablet; Refill: 0 - terconazole (TERAZOL 7) 0.4 % vaginal cream; Apply topically to external tissues as directed for 5-7 days as needed  Dispense: 45 g; Refill: 0  - Symptoms consistent with Yeast vaginitis - Diflucan and Terazol prescribed - Limit bubble baths, scented lotions/soaps/detergents - Limit tight fitting clothing - Seek on person evaluation if not improving or if symptoms worsen   Follow Up Instructions: I discussed the assessment and treatment plan with the patient. The patient was provided an opportunity to ask questions and all were answered. The patient agreed with the plan and demonstrated an understanding of the instructions.  A copy of instructions were sent to the patient via MyChart unless otherwise noted below.    The patient was advised to call back or seek an in-person evaluation if the symptoms worsen or if the condition fails to improve as anticipated.  Time:  I spent 10 minutes with the patient via telehealth technology discussing the above problems/concerns.    Marie Loveless, PA-C

## 2021-10-18 NOTE — Patient Instructions (Signed)
Marie Perez, thank you for joining [name], PA-C for today's virtual visit.  While this provider is not your primary care provider (PCP), if your PCP is located in our provider database this encounter information will be shared with them immediately following your visit.  Consent: (Patient) Marie Perez provided verbal consent for this virtual visit at the beginning of the encounter.  Current Medications:  Current Outpatient Medications:    fluconazole (DIFLUCAN) 150 MG tablet, Take 1 tablet (150 mg total) by mouth every 3 (three) days as needed., Disp: 2 tablet, Rfl: 0   terconazole (TERAZOL 7) 0.4 % vaginal cream, Apply topically to external tissues as directed for 5-7 days as needed, Disp: 45 g, Rfl: 0   amLODipine (NORVASC) 5 MG tablet, Take 1 tablet (5 mg total) by mouth daily., Disp: 30 tablet, Rfl: 2   atorvastatin (LIPITOR) 20 MG tablet, Take 1 tablet (20 mg total) by mouth daily., Disp: 90 tablet, Rfl: 0   cetirizine (ZYRTEC) 10 MG tablet, Take 1 tablet by mouth every day, Disp: 90 tablet, Rfl: 3   esomeprazole (NEXIUM) 20 MG capsule, TAKE 1 CAPSULE(20 MG) BY MOUTH DAILY, Disp: 30 capsule, Rfl: 2   fluticasone (FLONASE) 50 MCG/ACT nasal spray, Place 2 sprays into both nostrils daily as needed. For allergies, Disp: , Rfl:    fluticasone (FLONASE) 50 MCG/ACT nasal spray, Use 1 spray in each nostril once daily, Disp: 48 g, Rfl: 3   hydrochlorothiazide (HYDRODIURIL) 25 MG tablet, Take 1 tablet (25 mg total) by mouth daily., Disp: 30 tablet, Rfl: 2   ibuprofen (ADVIL,MOTRIN) 600 MG tablet, Take 1 tablet (600 mg total) by mouth every 6 (six) hours as needed., Disp: 30 tablet, Rfl: 0   influenza vac split quadrivalent PF (FLUARIX) 0.5 ML injection, Inject into the muscle., Disp: 0.5 mL, Rfl: 0   Insulin Pen Needle (NOVOFINE PEN NEEDLE) 32G X 6 MM MISC, Use with saxenda, Disp: 50 each, Rfl: 2   linaclotide (LINZESS) 290 MCG CAPS capsule, Take 1 capsule by mouth once daily  with first meal, Disp: 90 capsule, Rfl: 0   Liraglutide -Weight Management (SAXENDA) 18 MG/3ML SOPN, Inject 0.6 mg under the skin every day for 1 week,then increase dose by 0.6 mg every week up to 1.8 mg a day, Disp: 3 mL, Rfl: 1   loratadine (CLARITIN) 10 MG tablet, Take 10 mg by mouth daily., Disp: , Rfl:    lubiprostone (AMITIZA) 24 MCG capsule, Take 1 capsule by mouth twice daily with a meal, Disp: 60 capsule, Rfl: 4   montelukast (SINGULAIR) 10 MG tablet, 1 tablet Orally every other day 90 days, Disp: 45 tablet, Rfl: 4   montelukast (SINGULAIR) 10 MG tablet, 1 tablet Orally Once a day 90 days, Disp: 90 tablet, Rfl: 3   omeprazole (PRILOSEC) 40 MG capsule, Take 1 capsule by mouth every day 30 minutes before breakfast., Disp: 90 capsule, Rfl: 0   predniSONE (DELTASONE) 20 MG tablet, Take 2 tablets by mouth every day for 5 days, Disp: 10 tablet, Rfl: 0   SAXENDA 18 MG/3ML SOPN, ADMINISTER 3 MG UNDER THE SKIN DAILY, Disp: 9 mL, Rfl: 1   Semaglutide,0.25 or 0.5MG /DOS, (OZEMPIC, 0.25 OR 0.5 MG/DOSE,) 2 MG/1.5ML SOPN, Inject 0.25 mg under the skin once weekly for 4 weeks,then increase to 0.5 mg once weekly for 2 weeks, Disp: 1.5 mL, Rfl: 1   Semaglutide,0.25 or 0.5MG /DOS, (OZEMPIC, 0.25 OR 0.5 MG/DOSE,) 2 MG/1.5ML SOPN, Titrate 0.25 mg once weekly for 4  weeks then 0.5 mg once weekly for 2 weeks Subcutaneous once a week 30 days, Disp: 1.5 mL, Rfl: 1   Semaglutide,0.25 or 0.5MG /DOS, (OZEMPIC, 0.25 OR 0.5 MG/DOSE,) 2 MG/1.5ML SOPN, 0.5mg  weekly Subcutaneous once a week, Disp: 1.5 mL, Rfl: 1   Semaglutide-Weight Management (WEGOVY) 0.5 MG/0.5ML SOAJ, 0.5 mL Subcutaneous Once a week 30 day(s), Disp: 2 mL, Rfl: 0   Semaglutide-Weight Management (WEGOVY) 1 MG/0.5ML SOAJ, 1 mL Subcutaneous Once a week 30 day(s) once a week, Disp: 2 mL, Rfl: 1   Semaglutide-Weight Management (WEGOVY) 1.7 MG/0.75ML SOAJ, 0.75 ml Subcutaneous once a week 30 day(s), Disp: 3 mL, Rfl: 0   Semaglutide-Weight Management (WEGOVY) 2.4  MG/0.75ML SOAJ, Inject 0.75 ml Subcutaneous once a week 30 days, Disp: 3 mL, Rfl: 5   Semaglutide-Weight Management 0.25 MG/0.5ML SOAJ, Inject 0.25 mg (0.37ml) under the skin as directed, Disp: 2 mL, Rfl: 2   valsartan-hydrochlorothiazide (DIOVAN-HCT) 160-12.5 MG tablet, Take 1 tablet by mouth every day, Disp: 90 tablet, Rfl: 4   valsartan-hydrochlorothiazide (DIOVAN-HCT) 160-12.5 MG tablet, 1 tablet Orally Once a day 90 days, Disp: 90 tablet, Rfl: 4   Medications ordered in this encounter:  Meds ordered this encounter  Medications   fluconazole (DIFLUCAN) 150 MG tablet    Sig: Take 1 tablet (150 mg total) by mouth every 3 (three) days as needed.    Dispense:  2 tablet    Refill:  0    Order Specific Question:   Supervising Provider    Answer:   Chase Picket [[id number]]   terconazole (TERAZOL 7) 0.4 % vaginal cream    Sig: Apply topically to external tissues as directed for 5-7 days as needed    Dispense:  45 g    Refill:  0    Order Specific Question:   Supervising Provider    Answer:   Chase Picket [[id number]]     *If you need refills on other medications prior to your next appointment, please contact your pharmacy*  Follow-Up: Call back or seek an in-person evaluation if the symptoms worsen or if the condition fails to improve as anticipated.  [facility] [phone]  Other Instructions Vaginal Yeast Infection, Adult  Vaginal yeast infection is a condition that causes vaginal discharge as well as soreness, swelling, and redness (inflammation) of the vagina. This is a common condition. Some women get this infection frequently. What are the causes? This condition is caused by a change in the normal balance of the yeast (Candida) and normal bacteria that live in the vagina. This change causes an overgrowth of yeast, which causes the inflammation. What increases the risk? The condition is more likely to develop in women who: Take antibiotic medicines. Have  diabetes. Take birth control pills. Are pregnant. Douche often. Have a weak body defense system (immune system). Have been taking steroid medicines for a long time. Frequently wear tight clothing. What are the signs or symptoms? Symptoms of this condition include: White, thick, creamy vaginal discharge. Swelling, itching, redness, and irritation of the vagina. The lips of the vagina (labia) may be affected as well. Pain or a burning feeling while urinating. Pain during sex. How is this diagnosed? This condition is diagnosed based on: Your medical history. A physical exam. A pelvic exam. Your health care provider will examine a sample of your vaginal discharge under a microscope. Your health care provider may send this sample for testing to confirm the diagnosis. How is this treated? This condition is treated  with medicine. Medicines may be over-the-counter or prescription. You may be told to use one or more of the following: Medicine that is taken by mouth (orally). Medicine that is applied as a cream (topically). Medicine that is inserted directly into the vagina (suppository). Follow these instructions at home: Take or apply over-the-counter and prescription medicines only as told by your health care provider. Do not use tampons until your health care provider approves. Do not have sex until your infection has cleared. Sex can prolong or worsen your symptoms of infection. Ask your health care provider when it is safe to resume sexual activity. Keep all follow-up visits. This is important. How is this prevented?  Do not wear tight clothes, such as pantyhose or tight pants. Wear breathable cotton underwear. Do not use douches, perfumed soap, creams, or powders. Wipe from front to back after using the toilet. If you have diabetes, keep your blood sugar levels under control. Ask your health care provider for other ways to prevent yeast infections. Contact a health care provider  if: You have a fever. Your symptoms go away and then return. Your symptoms do not get better with treatment. Your symptoms get worse. You have new symptoms. You develop blisters in or around your vagina. You have blood coming from your vagina and it is not your menstrual period. You develop pain in your abdomen. Summary Vaginal yeast infection is a condition that causes discharge as well as soreness, swelling, and redness (inflammation) of the vagina. This condition is treated with medicine. Medicines may be over-the-counter or prescription. Take or apply over-the-counter and prescription medicines only as told by your health care provider. Do not douche. Resume sexual activity or use of tampons as instructed by your health care provider. Contact a health care provider if your symptoms do not get better with treatment or your symptoms go away and then return. This information is not intended to replace advice given to you by your health care provider. Make sure you discuss any questions you have with your health care provider. Document Revised: 03/26/2020 Document Reviewed: 03/26/2020 Elsevier Patient Education  2023 Elsevier Inc.    If you have been instructed to have an in-person evaluation today at a local Urgent Care facility, please use the link below. It will take you to a list of all of our available [facility], including address, phone number and hours of operation. Please do not delay care.  [facility]  If you or a family member do not have a primary care provider, use the link below to schedule a visit and establish care. When you choose a [facility] primary care physician or advanced practice provider, you gain a long-term partner in health. Find a Primary Care Provider  Learn more about [facility] in-office and virtual care options: [facility] - Get Care Now

## 2021-10-20 ENCOUNTER — Other Ambulatory Visit (HOSPITAL_BASED_OUTPATIENT_CLINIC_OR_DEPARTMENT_OTHER): Payer: Self-pay

## 2021-10-21 ENCOUNTER — Other Ambulatory Visit (HOSPITAL_BASED_OUTPATIENT_CLINIC_OR_DEPARTMENT_OTHER): Payer: Self-pay

## 2021-10-22 ENCOUNTER — Other Ambulatory Visit (HOSPITAL_BASED_OUTPATIENT_CLINIC_OR_DEPARTMENT_OTHER): Payer: Self-pay

## 2021-11-02 ENCOUNTER — Other Ambulatory Visit (HOSPITAL_BASED_OUTPATIENT_CLINIC_OR_DEPARTMENT_OTHER): Payer: Self-pay

## 2021-11-06 ENCOUNTER — Other Ambulatory Visit (HOSPITAL_BASED_OUTPATIENT_CLINIC_OR_DEPARTMENT_OTHER): Payer: Self-pay

## 2021-11-21 ENCOUNTER — Other Ambulatory Visit (HOSPITAL_BASED_OUTPATIENT_CLINIC_OR_DEPARTMENT_OTHER): Payer: Self-pay

## 2021-11-21 ENCOUNTER — Telehealth: Payer: 59 | Admitting: Family Medicine

## 2021-11-21 DIAGNOSIS — J069 Acute upper respiratory infection, unspecified: Secondary | ICD-10-CM

## 2021-11-21 MED ORDER — BENZONATATE 100 MG PO CAPS
200.0000 mg | ORAL_CAPSULE | Freq: Two times a day (BID) | ORAL | 0 refills | Status: DC | PRN
  Filled 2021-11-21: qty 20, 5d supply, fill #0

## 2021-11-21 MED ORDER — PROMETHAZINE-DM 6.25-15 MG/5ML PO SYRP
5.0000 mL | ORAL_SOLUTION | Freq: Three times a day (TID) | ORAL | 0 refills | Status: DC | PRN
  Filled 2021-11-21: qty 118, 8d supply, fill #0

## 2021-11-21 NOTE — Patient Instructions (Signed)
Marie Perez, thank you for joining Perlie Mayo, NP for today's virtual visit.  While this provider is not your primary care provider (PCP), if your PCP is located in our provider database this encounter information will be shared with them immediately following your visit.   [facility] account gives you access to today's visit and all your visits, tests, and labs performed at [facility] " click here if you don't have a [facility] account or go to [email or web address]  Consent: (Patient) Marie Perez provided verbal consent for this virtual visit at the beginning of the encounter.  Current Medications:  Current Outpatient Medications:    benzonatate (TESSALON) 100 MG capsule, Take 2 capsules (200 mg total) by mouth 2 (two) times daily as needed for cough., Disp: 20 capsule, Rfl: 0   promethazine-dextromethorphan (PROMETHAZINE-DM) 6.25-15 MG/5ML syrup, Take 5 mLs by mouth 3 (three) times daily as needed for cough., Disp: 118 mL, Rfl: 0   amLODipine (NORVASC) 5 MG tablet, Take 1 tablet (5 mg total) by mouth daily., Disp: 30 tablet, Rfl: 2   atorvastatin (LIPITOR) 20 MG tablet, Take 1 tablet (20 mg total) by mouth daily., Disp: 90 tablet, Rfl: 0   cetirizine (ZYRTEC) 10 MG tablet, Take 1 tablet by mouth every day, Disp: 90 tablet, Rfl: 3   esomeprazole (NEXIUM) 20 MG capsule, TAKE 1 CAPSULE(20 MG) BY MOUTH DAILY, Disp: 30 capsule, Rfl: 2   fluconazole (DIFLUCAN) 150 MG tablet, Take 1 tablet (150 mg total) by mouth every 3 (three) days as needed., Disp: 2 tablet, Rfl: 0   fluticasone (FLONASE) 50 MCG/ACT nasal spray, Place 2 sprays into both nostrils daily as needed. For allergies, Disp: , Rfl:    fluticasone (FLONASE) 50 MCG/ACT nasal spray, Use 1 spray in each nostril once daily, Disp: 48 g, Rfl: 3   hydrochlorothiazide (HYDRODIURIL) 25 MG tablet, Take 1 tablet (25 mg total) by mouth daily., Disp: 30 tablet, Rfl: 2   ibuprofen (ADVIL,MOTRIN) 600 MG  tablet, Take 1 tablet (600 mg total) by mouth every 6 (six) hours as needed., Disp: 30 tablet, Rfl: 0   influenza vac split quadrivalent PF (FLUARIX) 0.5 ML injection, Inject into the muscle., Disp: 0.5 mL, Rfl: 0   Insulin Pen Needle (NOVOFINE PEN NEEDLE) 32G X 6 MM MISC, Use with saxenda, Disp: 50 each, Rfl: 2   linaclotide (LINZESS) 290 MCG CAPS capsule, Take 1 capsule by mouth once daily with first meal, Disp: 90 capsule, Rfl: 0   Liraglutide -Weight Management (SAXENDA) 18 MG/3ML SOPN, Inject 0.6 mg under the skin every day for 1 week,then increase dose by 0.6 mg every week up to 1.8 mg a day, Disp: 3 mL, Rfl: 1   loratadine (CLARITIN) 10 MG tablet, Take 10 mg by mouth daily., Disp: , Rfl:    lubiprostone (AMITIZA) 24 MCG capsule, Take 1 capsule by mouth twice daily with a meal, Disp: 60 capsule, Rfl: 4   montelukast (SINGULAIR) 10 MG tablet, 1 tablet Orally every other day 90 days, Disp: 45 tablet, Rfl: 4   montelukast (SINGULAIR) 10 MG tablet, 1 tablet Orally Once a day 90 days, Disp: 90 tablet, Rfl: 3   omeprazole (PRILOSEC) 40 MG capsule, Take 1 capsule by mouth every day 30 minutes before breakfast., Disp: 90 capsule, Rfl: 0   predniSONE (DELTASONE) 20 MG tablet, Take 2 tablets by mouth every day for 5 days, Disp: 10 tablet, Rfl: 0   SAXENDA 18 MG/3ML SOPN, ADMINISTER 3 MG  UNDER THE SKIN DAILY, Disp: 9 mL, Rfl: 1   Semaglutide,0.25 or 0.5MG /DOS, (OZEMPIC, 0.25 OR 0.5 MG/DOSE,) 2 MG/1.5ML SOPN, Inject 0.25 mg under the skin once weekly for 4 weeks,then increase to 0.5 mg once weekly for 2 weeks, Disp: 1.5 mL, Rfl: 1   Semaglutide,0.25 or 0.5MG /DOS, (OZEMPIC, 0.25 OR 0.5 MG/DOSE,) 2 MG/1.5ML SOPN, Titrate 0.25 mg once weekly for 4 weeks then 0.5 mg once weekly for 2 weeks Subcutaneous once a week 30 days, Disp: 1.5 mL, Rfl: 1   Semaglutide,0.25 or 0.5MG /DOS, (OZEMPIC, 0.25 OR 0.5 MG/DOSE,) 2 MG/1.5ML SOPN, 0.5mg  weekly Subcutaneous once a week, Disp: 1.5 mL, Rfl: 1   Semaglutide-Weight  Management (WEGOVY) 0.5 MG/0.5ML SOAJ, 0.5 mL Subcutaneous Once a week 30 day(s), Disp: 2 mL, Rfl: 0   Semaglutide-Weight Management (WEGOVY) 1 MG/0.5ML SOAJ, 1 mL Subcutaneous Once a week 30 day(s) once a week, Disp: 2 mL, Rfl: 1   Semaglutide-Weight Management (WEGOVY) 1.7 MG/0.75ML SOAJ, 0.75 ml Subcutaneous once a week 30 day(s), Disp: 3 mL, Rfl: 0   Semaglutide-Weight Management (WEGOVY) 2.4 MG/0.75ML SOAJ, Inject 0.75 ml Subcutaneous once a week 30 days, Disp: 3 mL, Rfl: 5   Semaglutide-Weight Management 0.25 MG/0.5ML SOAJ, Inject 0.25 mg (0.75ml) under the skin as directed, Disp: 2 mL, Rfl: 2   terconazole (TERAZOL 7) 0.4 % vaginal cream, Apply topically to external tissues as directed for 5-7 days as needed, Disp: 45 g, Rfl: 0   valsartan-hydrochlorothiazide (DIOVAN-HCT) 160-12.5 MG tablet, Take 1 tablet by mouth every day, Disp: 90 tablet, Rfl: 4   valsartan-hydrochlorothiazide (DIOVAN-HCT) 160-12.5 MG tablet, 1 tablet Orally Once a day 90 days, Disp: 90 tablet, Rfl: 4   Medications ordered in this encounter:  Meds ordered this encounter  Medications   promethazine-dextromethorphan (PROMETHAZINE-DM) 6.25-15 MG/5ML syrup    Sig: Take 5 mLs by mouth 3 (three) times daily as needed for cough.    Dispense:  118 mL    Refill:  0    Order Specific Question:   Supervising Provider    Answer:   Merrilee Jansky [[id number]]   benzonatate (TESSALON) 100 MG capsule    Sig: Take 2 capsules (200 mg total) by mouth 2 (two) times daily as needed for cough.    Dispense:  20 capsule    Refill:  0    Order Specific Question:   Supervising Provider    Answer:   Merrilee Jansky [id number]     *If you need refills on other medications prior to your next appointment, please contact your pharmacy*  Follow-Up: Call back or seek an in-person evaluation if the symptoms worsen or if the condition fails to improve as anticipated.  [facility] [phone]  Other Instructions  -Take meds  as prescribed -Rest -Use a cool mist humidifier especially during the winter months when heat dries out the air. - Use saline nose sprays frequently to help soothe nasal passages and promote drainage. -Saline irrigations of the nose can be very helpful if done frequently.             * 4X daily for 1 week*             * Use of a nettie pot can be helpful with this.  *Follow directions with this* *Boiled or distilled water only -stay hydrated by drinking plenty of fluids - Keep thermostat turn down low to prevent drying out sinuses - For any cough or congestion- robitussin DM or Delsym as needed -  For fever or aches or pains- take tylenol or ibuprofen as directed on bottle             * for fevers greater than 101 orally you may alternate ibuprofen and tylenol every 3 hours.  If you do not improve you will need a follow up visit in person.                  If you have been instructed to have an in-person evaluation today at a local Urgent Care facility, please use the link below. It will take you to a list of all of our available [facility], including address, phone number and hours of operation. Please do not delay care.  [facility]  If you or a family member do not have a primary care provider, use the link below to schedule a visit and establish care. When you choose a [facility] primary care physician or advanced practice provider, you gain a long-term partner in health. Find a Primary Care Provider  Learn more about [facility]'s in-office and virtual care options: [facility] [facility] - Get Care Now

## 2021-11-21 NOTE — Progress Notes (Signed)
Virtual Visit Consent   [name], you are scheduled for a virtual visit with a [facility] provider today. Just as with appointments in the office, your consent must be obtained to participate. Your consent will be active for this visit and any virtual visit you may have with one of our providers in the next 365 days. If you have a MyChart account, a copy of this consent can be sent to you electronically.  As this is a virtual visit, video technology does not allow for your provider to perform a traditional examination. This may limit your provider's ability to fully assess your condition. If your provider identifies any concerns that need to be evaluated in person or the need to arrange testing (such as labs, EKG, etc.), we will make arrangements to do so. Although advances in technology are sophisticated, we cannot ensure that it will always work on either your end or our end. If the connection with a video visit is poor, the visit may have to be switched to a telephone visit. With either a video or telephone visit, we are not always able to ensure that we have a secure connection.  By engaging in this virtual visit, you consent to the provision of healthcare and authorize for your insurance to be billed (if applicable) for the services provided during this visit. Depending on your insurance coverage, you may receive a charge related to this service.  I need to obtain your verbal consent now. Are you willing to proceed with your visit today? Marie Perez has provided verbal consent on 11/21/2021 for a virtual visit (video or telephone). Marie Mayo, NP  Date: 11/21/2021 11:44 AM  Virtual Visit via Video Note   I, Marie Perez, connected with  Marie Perez  ([id number], 03-19-1975) on 11/21/21 at 11:45 AM EDT by a video-enabled telemedicine application and verified that I am speaking with the correct person using two identifiers.  Location: Patient: Virtual Visit Location  Patient: Home Provider: Virtual Visit Location Provider: Home Office   I discussed the limitations of evaluation and management by telemedicine and the availability of in person appointments. The patient expressed understanding and agreed to proceed.    History of Present Illness: Marie Perez is a 46 y.o. who identifies as a female who was assigned female at birth, and is being seen today for sinus symptoms- started two days ago. Head pressure, nasal drainage, and coughing (usually at night).  Denies ear pain, chest pain and shortness of breath. COVID negative on home test and Flu negative.  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    Allergies: No Known Allergies Medications:  Current Outpatient Medications:    amLODipine (NORVASC) 5 MG tablet, Take 1 tablet (5 mg total) by mouth daily., Disp: 30 tablet, Rfl: 2   atorvastatin (LIPITOR) 20 MG tablet, Take 1 tablet (20 mg total) by mouth daily., Disp: 90 tablet, Rfl: 0   cetirizine (ZYRTEC) 10 MG tablet, Take 1 tablet by mouth every day, Disp: 90 tablet, Rfl: 3   esomeprazole (NEXIUM) 20 MG capsule, TAKE 1 CAPSULE(20 MG) BY MOUTH DAILY, Disp: 30 capsule, Rfl: 2   fluconazole (DIFLUCAN) 150 MG  tablet, Take 1 tablet (150 mg total) by mouth every 3 (three) days as needed., Disp: 2 tablet, Rfl: 0   fluticasone (FLONASE) 50 MCG/ACT nasal spray, Place 2 sprays into both nostrils daily as needed. For allergies, Disp: , Rfl:    fluticasone (FLONASE) 50 MCG/ACT nasal spray, Use 1 spray in each nostril once daily, Disp: 48 g, Rfl: 3   hydrochlorothiazide (HYDRODIURIL) 25 MG tablet, Take 1 tablet (25 mg total) by mouth  daily., Disp: 30 tablet, Rfl: 2   ibuprofen (ADVIL,MOTRIN) 600 MG tablet, Take 1 tablet (600 mg total) by mouth every 6 (six) hours as needed., Disp: 30 tablet, Rfl: 0   influenza vac split quadrivalent PF (FLUARIX) 0.5 ML injection, Inject into the muscle., Disp: 0.5 mL, Rfl: 0   Insulin Pen Needle (NOVOFINE PEN NEEDLE) 32G X 6 MM MISC, Use with saxenda, Disp: 50 each, Rfl: 2   linaclotide (LINZESS) 290 MCG CAPS capsule, Take 1 capsule by mouth once daily with first meal, Disp: 90 capsule, Rfl: 0   Liraglutide -Weight Management (SAXENDA) 18 MG/3ML SOPN, Inject 0.6 mg under the skin every day for 1 week,then increase dose by 0.6 mg every week up to 1.8 mg a day, Disp: 3 mL, Rfl: 1   loratadine (CLARITIN) 10 MG tablet, Take 10 mg by mouth daily., Disp: , Rfl:    lubiprostone (AMITIZA) 24 MCG capsule, Take 1 capsule by mouth twice daily with a meal, Disp: 60 capsule, Rfl: 4   montelukast (SINGULAIR) 10 MG tablet, 1 tablet Orally every other day 90 days, Disp: 45 tablet, Rfl: 4   montelukast (SINGULAIR) 10 MG tablet, 1 tablet Orally Once a day 90 days, Disp: 90 tablet, Rfl: 3   omeprazole (PRILOSEC) 40 MG capsule, Take 1 capsule by mouth every day 30 minutes before breakfast., Disp: 90 capsule, Rfl: 0   predniSONE (DELTASONE) 20 MG tablet, Take 2 tablets by mouth every day for 5 days, Disp: 10 tablet, Rfl: 0   SAXENDA 18 MG/3ML SOPN, ADMINISTER 3 MG UNDER THE SKIN DAILY, Disp: 9 mL, Rfl: 1   Semaglutide,0.25 or 0.5MG /DOS, (OZEMPIC, 0.25 OR 0.5 MG/DOSE,) 2 MG/1.5ML SOPN, Inject 0.25 mg under the skin once weekly for 4 weeks,then increase to 0.5 mg once weekly for 2 weeks, Disp: 1.5 mL, Rfl: 1   Semaglutide,0.25 or 0.5MG /DOS, (OZEMPIC, 0.25 OR 0.5 MG/DOSE,) 2 MG/1.5ML SOPN, Titrate 0.25 mg once weekly for 4 weeks then 0.5 mg once weekly for 2 weeks Subcutaneous once a week 30 days, Disp: 1.5 mL, Rfl: 1   Semaglutide,0.25 or 0.5MG /DOS, (OZEMPIC, 0.25 OR 0.5 MG/DOSE,) 2 MG/1.5ML SOPN, 0.5mg  weekly  Subcutaneous once a week, Disp: 1.5 mL, Rfl: 1   Semaglutide-Weight Management (WEGOVY) 0.5 MG/0.5ML SOAJ, 0.5 mL Subcutaneous Once a week 30 day(s), Disp: 2 mL, Rfl: 0   Semaglutide-Weight Management (WEGOVY) 1 MG/0.5ML SOAJ, 1 mL Subcutaneous Once a week 30 day(s) once a week, Disp: 2 mL, Rfl: 1   Semaglutide-Weight Management (WEGOVY) 1.7 MG/0.75ML SOAJ, 0.75 ml Subcutaneous once a week 30 day(s), Disp: 3 mL, Rfl: 0   Semaglutide-Weight Management (WEGOVY) 2.4 MG/0.75ML SOAJ, Inject 0.75 ml Subcutaneous once a week 30 days, Disp: 3 mL, Rfl: 5   Semaglutide-Weight Management 0.25 MG/0.5ML SOAJ, Inject 0.25 mg (0.36ml) under the skin as directed, Disp: 2 mL, Rfl: 2   terconazole (TERAZOL 7) 0.4 % vaginal cream, Apply topically to external tissues as directed for 5-7 days as needed, Disp: 45 g, Rfl: 0   valsartan-hydrochlorothiazide (DIOVAN-HCT) 160-12.5 MG  tablet, Take 1 tablet by mouth every day, Disp: 90 tablet, Rfl: 4   valsartan-hydrochlorothiazide (DIOVAN-HCT) 160-12.5 MG tablet, 1 tablet Orally Once a day 90 days, Disp: 90 tablet, Rfl: 4  Observations/Objective: Patient is well-developed, well-nourished in no acute distress.  Resting comfortably  at home.  Head is normocephalic, atraumatic.  No labored breathing.  Speech is clear and coherent with logical content.  Patient is alert and oriented at baseline.    Assessment and Plan:   1. Viral URI with cough  - promethazine-dextromethorphan (PROMETHAZINE-DM) 6.25-15 MG/5ML syrup; Take 5 mLs by mouth 3 (three) times daily as needed for cough.  Dispense: 118 mL; Refill: 0  -Take meds as prescribed -Rest -Use a cool mist humidifier especially during the winter months when heat dries out the air. - Use saline nose sprays frequently to help soothe nasal passages and promote drainage. -Saline irrigations of the nose can be very helpful if done frequently.             * 4X daily for 1 week*             * Use of a nettie pot can be  helpful with this.  *Follow directions with this* *Boiled or distilled water only -stay hydrated by drinking plenty of fluids - Keep thermostat turn down low to prevent drying out sinuses - For any cough or congestion- robitussin DM or Delsym as needed - For fever or aches or pains- take tylenol or ibuprofen as directed on bottle             * for fevers greater than 101 orally you may alternate ibuprofen and tylenol every 3 hours.  If you do not improve you will need a follow up visit in person.               Reviewed side effects, risks and benefits of medication.    Patient acknowledged agreement and understanding of the plan.   Past Medical, Surgical, Social History, Allergies, and Medications have been Reviewed.   Follow Up Instructions: I discussed the assessment and treatment plan with the patient. The patient was provided an opportunity to ask questions and all were answered. The patient agreed with the plan and demonstrated an understanding of the instructions.  A copy of instructions were sent to the patient via MyChart unless otherwise noted below.    The patient was advised to call back or seek an in-person evaluation if the symptoms worsen or if the condition fails to improve as anticipated.  Time:  I spent 10 minutes with the patient via telehealth technology discussing the above problems/concerns.    Freddy Finner, NP

## 2021-11-25 ENCOUNTER — Other Ambulatory Visit (HOSPITAL_BASED_OUTPATIENT_CLINIC_OR_DEPARTMENT_OTHER): Payer: Self-pay | Admitting: Family Medicine

## 2021-11-25 DIAGNOSIS — Z1231 Encounter for screening mammogram for malignant neoplasm of breast: Secondary | ICD-10-CM

## 2021-11-29 ENCOUNTER — Ambulatory Visit (HOSPITAL_BASED_OUTPATIENT_CLINIC_OR_DEPARTMENT_OTHER): Admission: RE | Admit: 2021-11-29 | Payer: 59 | Source: Ambulatory Visit | Admitting: Radiology

## 2021-11-29 ENCOUNTER — Other Ambulatory Visit (HOSPITAL_BASED_OUTPATIENT_CLINIC_OR_DEPARTMENT_OTHER): Payer: Self-pay

## 2021-11-29 ENCOUNTER — Telehealth: Payer: 59 | Admitting: Emergency Medicine

## 2021-11-29 DIAGNOSIS — J019 Acute sinusitis, unspecified: Secondary | ICD-10-CM | POA: Diagnosis not present

## 2021-11-29 DIAGNOSIS — B3731 Acute candidiasis of vulva and vagina: Secondary | ICD-10-CM

## 2021-11-29 DIAGNOSIS — B9689 Other specified bacterial agents as the cause of diseases classified elsewhere: Secondary | ICD-10-CM | POA: Diagnosis not present

## 2021-11-29 MED ORDER — AMOXICILLIN-POT CLAVULANATE 875-125 MG PO TABS
1.0000 | ORAL_TABLET | Freq: Two times a day (BID) | ORAL | 0 refills | Status: DC
  Filled 2021-11-29: qty 14, 7d supply, fill #0

## 2021-11-29 MED ORDER — FLUCONAZOLE 150 MG PO TABS
150.0000 mg | ORAL_TABLET | ORAL | 0 refills | Status: DC | PRN
  Filled 2021-11-29: qty 2, 6d supply, fill #0

## 2021-11-29 NOTE — Progress Notes (Signed)
Virtual Visit Consent   Marie Perez, you are scheduled for a virtual visit with a [facility] provider today. Just as with appointments in the office, your consent must be obtained to participate. Your consent will be active for this visit and any virtual visit you may have with one of our providers in the next 365 days. If you have a MyChart account, a copy of this consent can be sent to you electronically.  As this is a virtual visit, video technology does not allow for your provider to perform a traditional examination. This may limit your provider's ability to fully assess your condition. If your provider identifies any concerns that need to be evaluated in person or the need to arrange testing (such as labs, EKG, etc.), we will make arrangements to do so. Although advances in technology are sophisticated, we cannot ensure that it will always work on either your end or our end. If the connection with a video visit is poor, the visit may have to be switched to a telephone visit. With either a video or telephone visit, we are not always able to ensure that we have a secure connection.  By engaging in this virtual visit, you consent to the provision of healthcare and authorize for your insurance to be billed (if applicable) for the services provided during this visit. Depending on your insurance coverage, you may receive a charge related to this service.  I need to obtain your verbal consent now. Are you willing to proceed with your visit today? Sylwia IZZABELL KLASEN has provided verbal consent on 11/29/2021 for a virtual visit (video or telephone). Cathlyn Parsons, NP  Date: 11/29/2021 6:07 PM  Virtual Visit via Video Note   I, Cathlyn Parsons, connected with  [name]  ([id number], November 19, 1975) on 11/29/21 at  6:00 PM EST by a video-enabled telemedicine application and verified that I am speaking with the correct person using two identifiers.  Location: Patient: Virtual Visit Location  Patient: Home Provider: Virtual Visit Location Provider: Home Office   I discussed the limitations of evaluation and management by telemedicine and the availability of in person appointments. The patient expressed understanding and agreed to proceed.    History of Present Illness: Marie Perez is a 46 y.o. who identifies as a female who was assigned female at birth, and is being seen today for sinus infection. Pt reports 10 days of symtpoms: headache, sinus pressure in B maxillary sinuses, thick yellow nasal drainage, sore throat, post nasal drainage. No fever or chills. Has been taking ibuprofen for symptoms.   Was seen for similar symptoms 8 days ago, told had viral sinus infection. Nasal saline recommended but pt has not used.   No longer has cough, has stopped using cough medicine.   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    Allergies: No Known Allergies Medications:  Current Outpatient Medications:    amoxicillin-clavulanate (AUGMENTIN) 875-125 MG tablet, Take 1 tablet by mouth 2 (two) times daily., Disp: 14 tablet, Rfl: 0   atorvastatin (LIPITOR) 20 MG tablet, Take 1 tablet (20 mg total) by mouth daily., Disp: 90 tablet, Rfl: 0   benzonatate (TESSALON)  100 MG capsule, Take 2 capsules (200 mg total) by mouth 2 (two) times daily as needed for cough., Disp: 20 capsule, Rfl: 0   cetirizine (ZYRTEC) 10 MG tablet, Take 1 tablet by mouth every day, Disp: 90 tablet, Rfl: 3   esomeprazole (NEXIUM) 20 MG capsule, TAKE 1 CAPSULE(20 MG) BY MOUTH DAILY, Disp: 30 capsule, Rfl: 2   fluconazole (DIFLUCAN) 150 MG tablet, Take 1 tablet (150 mg total) by mouth  every 3 (three) days as needed., Disp: 2 tablet, Rfl: 0   fluticasone (FLONASE) 50 MCG/ACT nasal spray, Place 2 sprays into both nostrils daily as needed. For allergies, Disp: , Rfl:    fluticasone (FLONASE) 50 MCG/ACT nasal spray, Use 1 spray in each nostril once daily, Disp: 48 g, Rfl: 3   ibuprofen (ADVIL,MOTRIN) 600 MG tablet, Take 1 tablet (600 mg total) by mouth every 6 (six) hours as needed., Disp: 30 tablet, Rfl: 0   influenza vac split quadrivalent PF (FLUARIX) 0.5 ML injection, Inject into the muscle., Disp: 0.5 mL, Rfl: 0   Insulin Pen Needle (NOVOFINE PEN NEEDLE) 32G X 6 MM MISC, Use with saxenda, Disp: 50 each, Rfl: 2   linaclotide (LINZESS) 290 MCG CAPS capsule, Take 1 capsule by mouth once daily with first meal, Disp: 90 capsule, Rfl: 0   Liraglutide -Weight Management (SAXENDA) 18 MG/3ML SOPN, Inject 0.6 mg under the skin every day for 1 week,then increase dose by 0.6 mg every week up to 1.8 mg a day, Disp: 3 mL, Rfl: 1   lubiprostone (AMITIZA) 24 MCG capsule, Take 1 capsule by mouth twice daily with a meal, Disp: 60 capsule, Rfl: 4   montelukast (SINGULAIR) 10 MG tablet, 1 tablet Orally every other day 90 days, Disp: 45 tablet, Rfl: 4   montelukast (SINGULAIR) 10 MG tablet, 1 tablet Orally Once a day 90 days, Disp: 90 tablet, Rfl: 3   omeprazole (PRILOSEC) 40 MG capsule, Take 1 capsule by mouth every day 30 minutes before breakfast., Disp: 90 capsule, Rfl: 0   predniSONE (DELTASONE) 20 MG tablet, Take 2 tablets by mouth every day for 5 days, Disp: 10 tablet, Rfl: 0   promethazine-dextromethorphan (PROMETHAZINE-DM) 6.25-15 MG/5ML syrup, Take 5 mLs by mouth 3 (three) times daily as needed for cough., Disp: 118 mL, Rfl: 0   SAXENDA 18 MG/3ML SOPN, ADMINISTER 3 MG UNDER THE SKIN DAILY, Disp: 9 mL, Rfl: 1   Semaglutide-Weight Management (WEGOVY) 0.5 MG/0.5ML SOAJ, 0.5 mL Subcutaneous Once a week 30 day(s), Disp: 2 mL, Rfl: 0   Semaglutide-Weight Management (WEGOVY) 1 MG/0.5ML SOAJ, 1 mL  Subcutaneous Once a week 30 day(s) once a week, Disp: 2 mL, Rfl: 1   Semaglutide-Weight Management (WEGOVY) 1.7 MG/0.75ML SOAJ, 0.75 ml Subcutaneous once a week 30 day(s), Disp: 3 mL, Rfl: 0   Semaglutide-Weight Management (WEGOVY) 2.4 MG/0.75ML SOAJ, Inject 0.75 ml Subcutaneous once a week 30 days, Disp: 3 mL, Rfl: 5   Semaglutide-Weight Management 0.25 MG/0.5ML SOAJ, Inject 0.25 mg (0.23ml) under the skin as directed, Disp: 2 mL, Rfl: 2   terconazole (TERAZOL 7) 0.4 % vaginal cream, Apply topically to external tissues as directed for 5-7 days as needed, Disp: 45 g, Rfl: 0   valsartan-hydrochlorothiazide (DIOVAN-HCT) 160-12.5 MG tablet, Take 1 tablet by mouth every day, Disp: 90 tablet, Rfl: 4   valsartan-hydrochlorothiazide (DIOVAN-HCT) 160-12.5 MG tablet, 1 tablet Orally Once a day 90 days, Disp: 90 tablet, Rfl: 4  Observations/Objective: Patient is well-developed, well-nourished in no acute distress.  Resting comfortably  at home.  Head is normocephalic, atraumatic.  No labored breathing.  Speech is clear and coherent with logical content.  Patient is alert and oriented at baseline.    Assessment and Plan: 1. Yeast vaginitis - fluconazole (DIFLUCAN) 150 MG tablet; Take 1 tablet (150 mg total) by mouth every 3 (three) days as needed.  Dispense: 2 tablet; Refill: 0  2. Acute bacterial sinusitis  Rx augmentin. Emphasized nasal saline to prevent and treat sinus infections.  Rx diflucan as requested when taking antibiotics, is prone to yeast infection.   Follow Up Instructions: I discussed the assessment and treatment plan with the patient. The patient was provided an opportunity to ask questions and all were answered. The patient agreed with the plan and demonstrated an understanding of the instructions.  A copy of instructions were sent to the patient via MyChart unless otherwise noted below.     The patient was advised to call back or seek an in-person evaluation if the symptoms  worsen or if the condition fails to improve as anticipated.  Time:  I spent 10 minutes with the patient via telehealth technology discussing the above problems/concerns.    Cathlyn Parsons, NP

## 2021-11-29 NOTE — Patient Instructions (Signed)
Marie Perez, thank you for joining Cathlyn Parsons, NP for today's virtual visit.  While this provider is not your primary care provider (PCP), if your PCP is located in our provider database this encounter information will be shared with them immediately following your visit.   A [facility] MyChart account gives you access to today's visit and all your visits, tests, and labs performed at [facility] " click here if you don't have a [facility] MyChart account or go to [email or web address]  Consent: (Patient) Marie Perez provided verbal consent for this virtual visit at the beginning of the encounter.  Current Medications:  Current Outpatient Medications:    amoxicillin-clavulanate (AUGMENTIN) 875-125 MG tablet, Take 1 tablet by mouth 2 (two) times daily., Disp: 14 tablet, Rfl: 0   atorvastatin (LIPITOR) 20 MG tablet, Take 1 tablet (20 mg total) by mouth daily., Disp: 90 tablet, Rfl: 0   benzonatate (TESSALON) 100 MG capsule, Take 2 capsules (200 mg total) by mouth 2 (two) times daily as needed for cough., Disp: 20 capsule, Rfl: 0   cetirizine (ZYRTEC) 10 MG tablet, Take 1 tablet by mouth every day, Disp: 90 tablet, Rfl: 3   esomeprazole (NEXIUM) 20 MG capsule, TAKE 1 CAPSULE(20 MG) BY MOUTH DAILY, Disp: 30 capsule, Rfl: 2   fluconazole (DIFLUCAN) 150 MG tablet, Take 1 tablet (150 mg total) by mouth every 3 (three) days as needed., Disp: 2 tablet, Rfl: 0   fluticasone (FLONASE) 50 MCG/ACT nasal spray, Place 2 sprays into both nostrils daily as needed. For allergies, Disp: , Rfl:    fluticasone (FLONASE) 50 MCG/ACT nasal spray, Use 1 spray in each nostril once daily, Disp: 48 g, Rfl: 3   ibuprofen (ADVIL,MOTRIN) 600 MG tablet, Take 1 tablet (600 mg total) by mouth every 6 (six) hours as needed., Disp: 30 tablet, Rfl: 0   influenza vac split quadrivalent PF (FLUARIX) 0.5 ML injection, Inject into the muscle., Disp: 0.5 mL, Rfl: 0   Insulin Pen Needle (NOVOFINE PEN  NEEDLE) 32G X 6 MM MISC, Use with saxenda, Disp: 50 each, Rfl: 2   linaclotide (LINZESS) 290 MCG CAPS capsule, Take 1 capsule by mouth once daily with first meal, Disp: 90 capsule, Rfl: 0   Liraglutide -Weight Management (SAXENDA) 18 MG/3ML SOPN, Inject 0.6 mg under the skin every day for 1 week,then increase dose by 0.6 mg every week up to 1.8 mg a day, Disp: 3 mL, Rfl: 1   lubiprostone (AMITIZA) 24 MCG capsule, Take 1 capsule by mouth twice daily with a meal, Disp: 60 capsule, Rfl: 4   montelukast (SINGULAIR) 10 MG tablet, 1 tablet Orally every other day 90 days, Disp: 45 tablet, Rfl: 4   montelukast (SINGULAIR) 10 MG tablet, 1 tablet Orally Once a day 90 days, Disp: 90 tablet, Rfl: 3   omeprazole (PRILOSEC) 40 MG capsule, Take 1 capsule by mouth every day 30 minutes before breakfast., Disp: 90 capsule, Rfl: 0   predniSONE (DELTASONE) 20 MG tablet, Take 2 tablets by mouth every day for 5 days, Disp: 10 tablet, Rfl: 0   promethazine-dextromethorphan (PROMETHAZINE-DM) 6.25-15 MG/5ML syrup, Take 5 mLs by mouth 3 (three) times daily as needed for cough., Disp: 118 mL, Rfl: 0   SAXENDA 18 MG/3ML SOPN, ADMINISTER 3 MG UNDER THE SKIN DAILY, Disp: 9 mL, Rfl: 1   Semaglutide-Weight Management (WEGOVY) 0.5 MG/0.5ML SOAJ, 0.5 mL Subcutaneous Once a week 30 day(s), Disp: 2 mL, Rfl: 0   Semaglutide-Weight Management (WEGOVY) 1 MG/0.5ML SOAJ,  1 mL Subcutaneous Once a week 30 day(s) once a week, Disp: 2 mL, Rfl: 1   Semaglutide-Weight Management (WEGOVY) 1.7 MG/0.75ML SOAJ, 0.75 ml Subcutaneous once a week 30 day(s), Disp: 3 mL, Rfl: 0   Semaglutide-Weight Management (WEGOVY) 2.4 MG/0.75ML SOAJ, Inject 0.75 ml Subcutaneous once a week 30 days, Disp: 3 mL, Rfl: 5   Semaglutide-Weight Management 0.25 MG/0.5ML SOAJ, Inject 0.25 mg (0.54ml) under the skin as directed, Disp: 2 mL, Rfl: 2   terconazole (TERAZOL 7) 0.4 % vaginal cream, Apply topically to external tissues as directed for 5-7 days as needed, Disp: 45 g,  Rfl: 0   valsartan-hydrochlorothiazide (DIOVAN-HCT) 160-12.5 MG tablet, Take 1 tablet by mouth every day, Disp: 90 tablet, Rfl: 4   valsartan-hydrochlorothiazide (DIOVAN-HCT) 160-12.5 MG tablet, 1 tablet Orally Once a day 90 days, Disp: 90 tablet, Rfl: 4   Medications ordered in this encounter:  Meds ordered this encounter  Medications   fluconazole (DIFLUCAN) 150 MG tablet    Sig: Take 1 tablet (150 mg total) by mouth every 3 (three) days as needed.    Dispense:  2 tablet    Refill:  0   amoxicillin-clavulanate (AUGMENTIN) 875-125 MG tablet    Sig: Take 1 tablet by mouth 2 (two) times daily.    Dispense:  14 tablet    Refill:  0     *If you need refills on other medications prior to your next appointment, please contact your pharmacy*  Follow-Up: Call back or seek an in-person evaluation if the symptoms worsen or if the condition fails to improve as anticipated.  [facility] [phone]  Other Instructions I highly recommend using saline irrigation or nasal saline spray to prevent and treat sinus infections. Try using saline irrigation, such as with a neti pot, several times a day while you are sick. Many neti pots come with salt packets premeasured to use to make saline. If you use your own salt, make sure it is kosher salt or sea salt (don't use table salt as it has iodine in it and you don't need that in your nose). Use distilled water to make saline. If you mix your own saline using your own salt, the recipe is 1/4 teaspoon salt in 1 cup warm water. Using saline irrigation can help prevent and treat sinus infections.     If you have been instructed to have an in-person evaluation today at a local Urgent Care facility, please use the link below. It will take you to a list of all of our available [facility], including address, phone number and hours of operation. Please do not delay care.  [facility]  If you or a family member do not  have a primary care provider, use the link below to schedule a visit and establish care. When you choose a [facility] primary care physician or advanced practice provider, you gain a long-term partner in health. Find a Primary Care Provider  Learn more about [facility] in-office and virtual care options:  - Get Care Now

## 2021-11-30 ENCOUNTER — Other Ambulatory Visit (HOSPITAL_BASED_OUTPATIENT_CLINIC_OR_DEPARTMENT_OTHER): Payer: Self-pay

## 2021-12-04 ENCOUNTER — Ambulatory Visit (HOSPITAL_BASED_OUTPATIENT_CLINIC_OR_DEPARTMENT_OTHER)
Admission: RE | Admit: 2021-12-04 | Discharge: 2021-12-04 | Disposition: A | Discharge status: Home / Self Care | Payer: 59 | Source: Ambulatory Visit | Attending: Family Medicine | Admitting: Family Medicine

## 2021-12-04 DIAGNOSIS — Z1231 Encounter for screening mammogram for malignant neoplasm of breast: Secondary | ICD-10-CM | POA: Insufficient documentation

## 2021-12-25 ENCOUNTER — Other Ambulatory Visit (HOSPITAL_COMMUNITY): Payer: Self-pay

## 2022-01-06 ENCOUNTER — Other Ambulatory Visit (HOSPITAL_BASED_OUTPATIENT_CLINIC_OR_DEPARTMENT_OTHER): Payer: Self-pay

## 2022-01-07 ENCOUNTER — Other Ambulatory Visit: Payer: Self-pay

## 2022-01-08 ENCOUNTER — Other Ambulatory Visit (HOSPITAL_BASED_OUTPATIENT_CLINIC_OR_DEPARTMENT_OTHER): Payer: Self-pay

## 2022-01-08 MED ORDER — ATORVASTATIN CALCIUM 20 MG PO TABS
20.0000 mg | ORAL_TABLET | Freq: Every day | ORAL | 0 refills | Status: DC
  Filled 2022-01-08: qty 90, 90d supply, fill #0

## 2022-01-08 MED ORDER — LINZESS 290 MCG PO CAPS
290.0000 ug | ORAL_CAPSULE | ORAL | 0 refills | Status: DC
  Filled 2022-01-08: qty 90, 90d supply, fill #0

## 2022-01-08 MED ORDER — OMEPRAZOLE 40 MG PO CPDR
40.0000 mg | DELAYED_RELEASE_CAPSULE | Freq: Every day | ORAL | 0 refills | Status: DC
  Filled 2022-01-08: qty 90, 90d supply, fill #0

## 2022-01-17 ENCOUNTER — Other Ambulatory Visit: Payer: Self-pay

## 2022-01-17 ENCOUNTER — Other Ambulatory Visit (HOSPITAL_BASED_OUTPATIENT_CLINIC_OR_DEPARTMENT_OTHER): Payer: Self-pay

## 2022-01-18 ENCOUNTER — Other Ambulatory Visit (HOSPITAL_BASED_OUTPATIENT_CLINIC_OR_DEPARTMENT_OTHER): Payer: Self-pay

## 2022-01-23 ENCOUNTER — Other Ambulatory Visit (HOSPITAL_BASED_OUTPATIENT_CLINIC_OR_DEPARTMENT_OTHER): Payer: Self-pay

## 2022-02-12 ENCOUNTER — Other Ambulatory Visit (HOSPITAL_BASED_OUTPATIENT_CLINIC_OR_DEPARTMENT_OTHER): Payer: Self-pay

## 2022-02-17 ENCOUNTER — Other Ambulatory Visit (HOSPITAL_BASED_OUTPATIENT_CLINIC_OR_DEPARTMENT_OTHER): Payer: Self-pay

## 2022-02-24 ENCOUNTER — Other Ambulatory Visit (HOSPITAL_BASED_OUTPATIENT_CLINIC_OR_DEPARTMENT_OTHER): Payer: Self-pay

## 2022-02-24 ENCOUNTER — Telehealth: Payer: Commercial Managed Care - PPO | Admitting: Family Medicine

## 2022-02-24 DIAGNOSIS — B9689 Other specified bacterial agents as the cause of diseases classified elsewhere: Secondary | ICD-10-CM | POA: Diagnosis not present

## 2022-02-24 DIAGNOSIS — J019 Acute sinusitis, unspecified: Secondary | ICD-10-CM

## 2022-02-24 DIAGNOSIS — R051 Acute cough: Secondary | ICD-10-CM

## 2022-02-24 MED ORDER — DOXYCYCLINE HYCLATE 100 MG PO TABS
100.0000 mg | ORAL_TABLET | Freq: Two times a day (BID) | ORAL | 0 refills | Status: AC
  Filled 2022-02-24: qty 20, 10d supply, fill #0

## 2022-02-24 MED ORDER — BENZONATATE 100 MG PO CAPS
100.0000 mg | ORAL_CAPSULE | Freq: Three times a day (TID) | ORAL | 0 refills | Status: DC | PRN
  Filled 2022-02-24: qty 20, 7d supply, fill #0

## 2022-02-24 MED ORDER — PSEUDOEPH-BROMPHEN-DM 30-2-10 MG/5ML PO SYRP
5.0000 mL | ORAL_SOLUTION | Freq: Four times a day (QID) | ORAL | 0 refills | Status: DC | PRN
  Filled 2022-02-24: qty 118, 6d supply, fill #0

## 2022-02-24 NOTE — Progress Notes (Signed)
Virtual Visit Consent   VERNAL RUTAN, you are scheduled for a virtual visit with a [facility] provider today. Just as with appointments in the office, your consent must be obtained to participate. Your consent will be active for this visit and any virtual visit you may have with one of our providers in the next 365 days. If you have a MyChart account, a copy of this consent can be sent to you electronically.  As this is a virtual visit, video technology does not allow for your provider to perform a traditional examination. This may limit your provider's ability to fully assess your condition. If your provider identifies any concerns that need to be evaluated in person or the need to arrange testing (such as labs, EKG, etc.), we will make arrangements to do so. Although advances in technology are sophisticated, we cannot ensure that it will always work on either your end or our end. If the connection with a video visit is poor, the visit may have to be switched to a telephone visit. With either a video or telephone visit, we are not always able to ensure that we have a secure connection.  By engaging in this virtual visit, you consent to the provision of healthcare and authorize for your insurance to be billed (if applicable) for the services provided during this visit. Depending on your insurance coverage, you may receive a charge related to this service.  I need to obtain your verbal consent now. Are you willing to proceed with your visit today? Marie Perez has provided verbal consent on 02/24/2022 for a virtual visit (video or telephone). Perlie Mayo, NP  Date: 02/24/2022 11:33 AM  Virtual Visit via Video Note   I, Perlie Mayo, connected with  Marie Perez  ([id number], 02/01/1975) on 02/24/22 at 11:30 AM EST by a video-enabled telemedicine application and verified that I am speaking with the correct person using two identifiers.  Location: Patient: Virtual Visit Location Patient:  Home Provider: Virtual Visit Location Provider: Home Office   I discussed the limitations of evaluation and management by telemedicine and the availability of in person appointments. The patient expressed understanding and agreed to proceed.    History of Present Illness: Marie Perez is a 47 y.o. who identifies as a female who was assigned female at birth, and is being seen today for cough that has been persistent for the last 2-3 weeks, also has has headache over the last week. Reports thinking this is allergy driven to start- covid and flu negative testing. Taking allergy medication and aleve- with minimum benefit- has tried nasal spray as well. Cough is more frequent as time goes on- and related to post nasal drip. Cough worse at nigh when laying down. Mild pressure in face- across the eye and cheek on the right side- that continues to build over the last several days, concerned for sinus infection. Denies chest pain, shortness of breath, fevers, chills.   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    Allergies: No Known Allergies Medications:  Current Outpatient Medications:    amoxicillin-clavulanate (AUGMENTIN) 875-125 MG tablet, Take 1 tablet by mouth 2 (two) times daily., Disp: 14  tablet, Rfl: 0   atorvastatin (LIPITOR) 20 MG tablet, Take 1 tablet (20 mg total) by mouth daily., Disp: 90 tablet, Rfl: 0   benzonatate (TESSALON) 100 MG capsule, Take 2 capsules (200 mg total) by mouth 2 (two) times daily as needed for cough., Disp: 20 capsule, Rfl: 0   cetirizine (ZYRTEC) 10 MG tablet, Take 1 tablet by mouth every day, Disp: 90 tablet, Rfl: 3   esomeprazole  (NEXIUM) 20 MG capsule, TAKE 1 CAPSULE(20 MG) BY MOUTH DAILY, Disp: 30 capsule, Rfl: 2   fluconazole (DIFLUCAN) 150 MG tablet, Take 1 tablet (150 mg total) by mouth every 3 (three) days as needed., Disp: 2 tablet, Rfl: 0   fluticasone (FLONASE) 50 MCG/ACT nasal spray, Place 2 sprays into both nostrils daily as needed. For allergies, Disp: , Rfl:    fluticasone (FLONASE) 50 MCG/ACT nasal spray, Use 1 spray in each nostril once daily, Disp: 48 g, Rfl: 3   ibuprofen (ADVIL,MOTRIN) 600 MG tablet, Take 1 tablet (600 mg total) by mouth every 6 (six) hours as needed., Disp: 30 tablet, Rfl: 0   influenza vac split quadrivalent PF (FLUARIX) 0.5 ML injection, Inject into the muscle., Disp: 0.5 mL, Rfl: 0   Insulin Pen Needle (NOVOFINE PEN NEEDLE) 32G X 6 MM MISC, Use with saxenda, Disp: 50 each, Rfl: 2   linaclotide (LINZESS) 290 MCG CAPS capsule, Take 1 capsule (290 mcg total) by mouth at least 30 minutes before the first meal of the day on an empty stomach., Disp: 90 capsule, Rfl: 0   Liraglutide -Weight Management (SAXENDA) 18 MG/3ML SOPN, Inject 0.6 mg under the skin every day for 1 week,then increase dose by 0.6 mg every week up to 1.8 mg a day, Disp: 3 mL, Rfl: 1   lubiprostone (AMITIZA) 24 MCG capsule, Take 1 capsule by mouth twice daily with a meal, Disp: 60 capsule, Rfl: 4   montelukast (SINGULAIR) 10 MG tablet, 1 tablet Orally every other day 90 days, Disp: 45 tablet, Rfl: 4   montelukast (SINGULAIR) 10 MG tablet, 1 tablet Orally Once a day 90 days, Disp: 90 tablet, Rfl: 3   omeprazole (PRILOSEC) 40 MG capsule, Take 1 capsule (40 mg total) by mouth daily 30 mintues before breakfast., Disp: 90 capsule, Rfl: 0   predniSONE (DELTASONE) 20 MG tablet, Take 2 tablets by mouth every day for 5 days, Disp: 10 tablet, Rfl: 0   promethazine-dextromethorphan (PROMETHAZINE-DM) 6.25-15 MG/5ML syrup, Take 5 mLs by mouth 3 (three) times daily as needed for cough., Disp: 118 mL, Rfl: 0   SAXENDA 18 MG/3ML SOPN,  ADMINISTER 3 MG UNDER THE SKIN DAILY, Disp: 9 mL, Rfl: 1   Semaglutide-Weight Management (WEGOVY) 0.5 MG/0.5ML SOAJ, 0.5 mL Subcutaneous Once a week 30 day(s), Disp: 2 mL, Rfl: 0   Semaglutide-Weight Management (WEGOVY) 1 MG/0.5ML SOAJ, 1 mL Subcutaneous Once a week 30 day(s) once a week, Disp: 2 mL, Rfl: 1   Semaglutide-Weight Management (WEGOVY) 1.7 MG/0.75ML SOAJ, 0.75 ml Subcutaneous once a week 30 day(s), Disp: 3 mL, Rfl: 0   Semaglutide-Weight Management (WEGOVY) 2.4 MG/0.75ML SOAJ, Inject 0.75 ml Subcutaneous once a week 30 days, Disp: 3 mL, Rfl: 5   Semaglutide-Weight Management 0.25 MG/0.5ML SOAJ, Inject 0.25 mg (0.80ml) under the skin as directed, Disp: 2 mL, Rfl: 2   terconazole (TERAZOL 7) 0.4 % vaginal cream, Apply topically to external tissues as directed for 5-7 days as needed, Disp: 45 g, Rfl: 0   valsartan-hydrochlorothiazide (DIOVAN-HCT) 160-12.5 MG tablet, Take 1 tablet  by mouth every day, Disp: 90 tablet, Rfl: 4   valsartan-hydrochlorothiazide (DIOVAN-HCT) 160-12.5 MG tablet, 1 tablet Orally Once a day 90 days, Disp: 90 tablet, Rfl: 4  Observations/Objective: Patient is well-developed, well-nourished in no acute distress.  Resting comfortably  at home.  Head is normocephalic, atraumatic.  No labored breathing.  Speech is clear and coherent with logical content.  Patient is alert and oriented at baseline.    Assessment and Plan:  1. Acute bacterial sinusitis  - doxycycline (VIBRA-TABS) 100 MG tablet; Take 1 tablet (100 mg total) by mouth 2 (two) times daily for 10 days.  Dispense: 20 tablet; Refill: 0 - brompheniramine-pseudoephedrine-DM 30-2-10 MG/5ML syrup; Take 5 mLs by mouth 4 (four) times daily as needed.  Dispense: 120 mL; Refill: 0  2. Acute cough  - benzonatate (TESSALON) 100 MG capsule; Take 1 capsule (100 mg total) by mouth 3 (three) times daily as needed for cough.  Dispense: 20 capsule; Refill: 0 - brompheniramine-pseudoephedrine-DM 30-2-10 MG/5ML syrup;  Take 5 mLs by mouth 4 (four) times daily as needed.  Dispense: 120 mL; Refill: 0   -Take meds as prescribed -Rest -Use a cool mist humidifier especially during the winter months when heat dries out the air. - Use saline nose sprays frequently to help soothe nasal passages and promote drainage. -Saline irrigations of the nose can be very helpful if done frequently.             * 4X daily for 1 week*             * Use of a nettie pot can be helpful with this.  *Follow directions with this* *Boiled or distilled water only -stay hydrated by drinking plenty of fluids - Keep thermostat turn down low to prevent drying out sinuses - For any cough or congestion- robitussin DM or Delsym as needed - For fever or aches or pains- take tylenol or ibuprofen as directed on bottle             * for fevers greater than 101 orally you may alternate ibuprofen and tylenol every 3 hours.  If you do not improve you will need a follow up visit in person.                  Reviewed side effects, risks and benefits of medication.    Patient acknowledged agreement and understanding of the plan.   Past Medical, Surgical, Social History, Allergies, and Medications have been Reviewed.    Follow Up Instructions: I discussed the assessment and treatment plan with the patient. The patient was provided an opportunity to ask questions and all were answered. The patient agreed with the plan and demonstrated an understanding of the instructions.  A copy of instructions were sent to the patient via MyChart unless otherwise noted below.    The patient was advised to call back or seek an in-person evaluation if the symptoms worsen or if the condition fails to improve as anticipated.  Time:  I spent 10 minutes with the patient via telehealth technology discussing the above problems/concerns.    Perlie Mayo, NP

## 2022-02-24 NOTE — Patient Instructions (Signed)
Marie Perez, thank you for joining Perlie Mayo, NP for today's virtual visit.  While this provider is not your primary care provider (PCP), if your PCP is located in our provider database this encounter information will be shared with them immediately following your visit.   [facility] account gives you access to today's visit and all your visits, tests, and labs performed at [facility] " click here if you don't have a [facility] account or go to [email or web address]  Consent: (Patient) Marie Perez provided verbal consent for this virtual visit at the beginning of the encounter.  Current Medications:  Current Outpatient Medications:    benzonatate (TESSALON) 100 MG capsule, Take 1 capsule (100 mg total) by mouth 3 (three) times daily as needed for cough., Disp: 20 capsule, Rfl: 0   brompheniramine-pseudoephedrine-DM 30-2-10 MG/5ML syrup, Take 5 mLs by mouth 4 (four) times daily as needed., Disp: 120 mL, Rfl: 0   doxycycline (VIBRA-TABS) 100 MG tablet, Take 1 tablet (100 mg total) by mouth 2 (two) times daily for 10 days., Disp: 20 tablet, Rfl: 0   atorvastatin (LIPITOR) 20 MG tablet, Take 1 tablet (20 mg total) by mouth daily., Disp: 90 tablet, Rfl: 0   cetirizine (ZYRTEC) 10 MG tablet, Take 1 tablet by mouth every day, Disp: 90 tablet, Rfl: 3   esomeprazole (NEXIUM) 20 MG capsule, TAKE 1 CAPSULE(20 MG) BY MOUTH DAILY, Disp: 30 capsule, Rfl: 2   fluconazole (DIFLUCAN) 150 MG tablet, Take 1 tablet (150 mg total) by mouth every 3 (three) days as needed., Disp: 2 tablet, Rfl: 0   fluticasone (FLONASE) 50 MCG/ACT nasal spray, Place 2 sprays into both nostrils daily as needed. For allergies, Disp: , Rfl:    fluticasone (FLONASE) 50 MCG/ACT nasal spray, Use 1 spray in each nostril once daily, Disp: 48 g, Rfl: 3   ibuprofen (ADVIL,MOTRIN) 600 MG tablet, Take 1 tablet (600 mg total) by mouth every 6 (six) hours as needed., Disp: 30 tablet, Rfl: 0    influenza vac split quadrivalent PF (FLUARIX) 0.5 ML injection, Inject into the muscle., Disp: 0.5 mL, Rfl: 0   Insulin Pen Needle (NOVOFINE PEN NEEDLE) 32G X 6 MM MISC, Use with saxenda, Disp: 50 each, Rfl: 2   linaclotide (LINZESS) 290 MCG CAPS capsule, Take 1 capsule (290 mcg total) by mouth at least 30 minutes before the first meal of the day on an empty stomach., Disp: 90 capsule, Rfl: 0   Liraglutide -Weight Management (SAXENDA) 18 MG/3ML SOPN, Inject 0.6 mg under the skin every day for 1 week,then increase dose by 0.6 mg every week up to 1.8 mg a day, Disp: 3 mL, Rfl: 1   lubiprostone (AMITIZA) 24 MCG capsule, Take 1 capsule by mouth twice daily with a meal, Disp: 60 capsule, Rfl: 4   montelukast (SINGULAIR) 10 MG tablet, 1 tablet Orally every other day 90 days, Disp: 45 tablet, Rfl: 4   montelukast (SINGULAIR) 10 MG tablet, 1 tablet Orally Once a day 90 days, Disp: 90 tablet, Rfl: 3   omeprazole (PRILOSEC) 40 MG capsule, Take 1 capsule (40 mg total) by mouth daily 30 mintues before breakfast., Disp: 90 capsule, Rfl: 0   predniSONE (DELTASONE) 20 MG tablet, Take 2 tablets by mouth every day for 5 days, Disp: 10 tablet, Rfl: 0   promethazine-dextromethorphan (PROMETHAZINE-DM) 6.25-15 MG/5ML syrup, Take 5 mLs by mouth 3 (three) times daily as needed for cough., Disp: 118 mL, Rfl: 0   SAXENDA 18  MG/3ML SOPN, ADMINISTER 3 MG UNDER THE SKIN DAILY, Disp: 9 mL, Rfl: 1   Semaglutide-Weight Management (WEGOVY) 0.5 MG/0.5ML SOAJ, 0.5 mL Subcutaneous Once a week 30 day(s), Disp: 2 mL, Rfl: 0   Semaglutide-Weight Management (WEGOVY) 1 MG/0.5ML SOAJ, 1 mL Subcutaneous Once a week 30 day(s) once a week, Disp: 2 mL, Rfl: 1   Semaglutide-Weight Management (WEGOVY) 1.7 MG/0.75ML SOAJ, 0.75 ml Subcutaneous once a week 30 day(s), Disp: 3 mL, Rfl: 0   Semaglutide-Weight Management (WEGOVY) 2.4 MG/0.75ML SOAJ, Inject 0.75 ml Subcutaneous once a week 30 days, Disp: 3 mL, Rfl: 5   Semaglutide-Weight Management 0.25  MG/0.5ML SOAJ, Inject 0.25 mg (0.33ml) under the skin as directed, Disp: 2 mL, Rfl: 2   terconazole (TERAZOL 7) 0.4 % vaginal cream, Apply topically to external tissues as directed for 5-7 days as needed, Disp: 45 g, Rfl: 0   valsartan-hydrochlorothiazide (DIOVAN-HCT) 160-12.5 MG tablet, Take 1 tablet by mouth every day, Disp: 90 tablet, Rfl: 4   valsartan-hydrochlorothiazide (DIOVAN-HCT) 160-12.5 MG tablet, 1 tablet Orally Once a day 90 days, Disp: 90 tablet, Rfl: 4   Medications ordered in this encounter:  Meds ordered this encounter  Medications   doxycycline (VIBRA-TABS) 100 MG tablet    Sig: Take 1 tablet (100 mg total) by mouth 2 (two) times daily for 10 days.    Dispense:  20 tablet    Refill:  0    Order Specific Question:   Supervising Provider    Answer:   Chase Picket [[id number]]   benzonatate (TESSALON) 100 MG capsule    Sig: Take 1 capsule (100 mg total) by mouth 3 (three) times daily as needed for cough.    Dispense:  20 capsule    Refill:  0    Order Specific Question:   Supervising Provider    Answer:   Chase Picket [id number]   brompheniramine-pseudoephedrine-DM 30-2-10 MG/5ML syrup    Sig: Take 5 mLs by mouth 4 (four) times daily as needed.    Dispense:  120 mL    Refill:  0    Order Specific Question:   Supervising Provider    Answer:   Chase Picket [[id number]]     *If you need refills on other medications prior to your next appointment, please contact your pharmacy*  Follow-Up: Call back or seek an in-person evaluation if the symptoms worsen or if the condition fails to improve as anticipated.  [facility] [phone]  Other Instructions  -Take meds as prescribed -Rest -Use a cool mist humidifier especially during the winter months when heat dries out the air. - Use saline nose sprays frequently to help soothe nasal passages and promote drainage. -Saline irrigations of the nose can be very helpful if done frequently.             *  4X daily for 1 week*             * Use of a nettie pot can be helpful with this.  *Follow directions with this* *Boiled or distilled water only -stay hydrated by drinking plenty of fluids - Keep thermostat turn down low to prevent drying out sinuses - For any cough or congestion- robitussin DM or Delsym as needed - For fever or aches or pains- take tylenol or ibuprofen as directed on bottle             * for fevers greater than 101 orally you may alternate ibuprofen and tylenol every 3  hours.  If you do not improve you will need a follow up visit in person.                  If you have been instructed to have an in-person evaluation today at a local Urgent Care facility, please use the link below. It will take you to a list of all of our available [facility], including address, phone number and hours of operation. Please do not delay care.  [facility]  If you or a family member do not have a primary care provider, use the link below to schedule a visit and establish care. When you choose a [facility] primary care physician or advanced practice provider, you gain a long-term partner in health. Find a Primary Care Provider  Learn more about [facility]'s in-office and virtual care options: [facility]

## 2022-03-05 DIAGNOSIS — H524 Presbyopia: Secondary | ICD-10-CM | POA: Diagnosis not present

## 2022-03-05 DIAGNOSIS — H52221 Regular astigmatism, right eye: Secondary | ICD-10-CM | POA: Diagnosis not present

## 2022-03-09 ENCOUNTER — Other Ambulatory Visit (HOSPITAL_BASED_OUTPATIENT_CLINIC_OR_DEPARTMENT_OTHER): Payer: Self-pay

## 2022-03-09 ENCOUNTER — Encounter (HOSPITAL_BASED_OUTPATIENT_CLINIC_OR_DEPARTMENT_OTHER): Payer: Self-pay | Admitting: Pharmacist

## 2022-03-10 ENCOUNTER — Other Ambulatory Visit (HOSPITAL_BASED_OUTPATIENT_CLINIC_OR_DEPARTMENT_OTHER): Payer: Self-pay

## 2022-03-10 MED ORDER — FLUTICASONE PROPIONATE 50 MCG/ACT NA SUSP
NASAL | 0 refills | Status: DC
  Filled 2022-03-10: qty 48, 90d supply, fill #0

## 2022-03-19 ENCOUNTER — Other Ambulatory Visit (HOSPITAL_BASED_OUTPATIENT_CLINIC_OR_DEPARTMENT_OTHER): Payer: Self-pay

## 2022-03-19 ENCOUNTER — Other Ambulatory Visit: Payer: Self-pay

## 2022-03-28 ENCOUNTER — Other Ambulatory Visit (HOSPITAL_BASED_OUTPATIENT_CLINIC_OR_DEPARTMENT_OTHER): Payer: Self-pay

## 2022-03-28 DIAGNOSIS — R519 Headache, unspecified: Secondary | ICD-10-CM | POA: Diagnosis not present

## 2022-03-28 DIAGNOSIS — Z6841 Body Mass Index (BMI) 40.0 and over, adult: Secondary | ICD-10-CM | POA: Diagnosis not present

## 2022-03-28 DIAGNOSIS — R7303 Prediabetes: Secondary | ICD-10-CM | POA: Diagnosis not present

## 2022-03-28 DIAGNOSIS — Z79899 Other long term (current) drug therapy: Secondary | ICD-10-CM | POA: Diagnosis not present

## 2022-03-28 DIAGNOSIS — E559 Vitamin D deficiency, unspecified: Secondary | ICD-10-CM | POA: Diagnosis not present

## 2022-03-28 DIAGNOSIS — Z0000 Encounter for general adult medical examination without abnormal findings: Secondary | ICD-10-CM | POA: Diagnosis not present

## 2022-03-28 DIAGNOSIS — K5900 Constipation, unspecified: Secondary | ICD-10-CM | POA: Diagnosis not present

## 2022-03-28 DIAGNOSIS — E88819 Insulin resistance, unspecified: Secondary | ICD-10-CM | POA: Diagnosis not present

## 2022-03-28 DIAGNOSIS — I10 Essential (primary) hypertension: Secondary | ICD-10-CM | POA: Diagnosis not present

## 2022-03-28 MED ORDER — WEGOVY 2.4 MG/0.75ML ~~LOC~~ SOAJ
2.4000 mg | SUBCUTANEOUS | 3 refills | Status: DC
  Filled 2022-03-28: qty 9, 84d supply, fill #0
  Filled 2022-04-10 – 2022-04-11 (×3): qty 3, 28d supply, fill #0
  Filled 2022-07-16: qty 3, 28d supply, fill #1

## 2022-03-31 ENCOUNTER — Other Ambulatory Visit (HOSPITAL_BASED_OUTPATIENT_CLINIC_OR_DEPARTMENT_OTHER): Payer: Self-pay

## 2022-04-01 ENCOUNTER — Encounter (HOSPITAL_BASED_OUTPATIENT_CLINIC_OR_DEPARTMENT_OTHER): Payer: Self-pay | Admitting: Pharmacist

## 2022-04-01 ENCOUNTER — Other Ambulatory Visit (HOSPITAL_BASED_OUTPATIENT_CLINIC_OR_DEPARTMENT_OTHER): Payer: Self-pay

## 2022-04-01 MED ORDER — VITAMIN D (ERGOCALCIFEROL) 50000 UNITS PO CAPS
1.0000 | ORAL_CAPSULE | ORAL | 3 refills | Status: DC
  Filled 2022-04-01: qty 12, 84d supply, fill #0
  Filled 2022-05-22 – 2022-06-09 (×5): qty 12, 84d supply, fill #1
  Filled 2022-09-13 (×2): qty 12, 84d supply, fill #2
  Filled 2022-12-11: qty 12, 84d supply, fill #3

## 2022-04-01 MED ORDER — DEXLANSOPRAZOLE 60 MG PO CPDR
60.0000 mg | DELAYED_RELEASE_CAPSULE | Freq: Every day | ORAL | 5 refills | Status: DC
  Filled 2022-04-01 – 2022-04-10 (×2): qty 30, 30d supply, fill #0

## 2022-04-10 ENCOUNTER — Other Ambulatory Visit: Payer: Self-pay

## 2022-04-10 ENCOUNTER — Other Ambulatory Visit (HOSPITAL_BASED_OUTPATIENT_CLINIC_OR_DEPARTMENT_OTHER): Payer: Self-pay

## 2022-04-10 MED ORDER — VALSARTAN-HYDROCHLOROTHIAZIDE 160-12.5 MG PO TABS
1.0000 | ORAL_TABLET | Freq: Every day | ORAL | 3 refills | Status: DC
  Filled 2022-04-10: qty 90, 90d supply, fill #0

## 2022-04-10 MED ORDER — PANTOPRAZOLE SODIUM 40 MG PO TBEC
40.0000 mg | DELAYED_RELEASE_TABLET | Freq: Every day | ORAL | 1 refills | Status: DC
  Filled 2022-04-10: qty 90, 90d supply, fill #0

## 2022-04-10 MED ORDER — ATORVASTATIN CALCIUM 20 MG PO TABS
20.0000 mg | ORAL_TABLET | Freq: Every day | ORAL | 3 refills | Status: DC
  Filled 2022-04-10: qty 90, 90d supply, fill #0
  Filled 2022-06-18 – 2022-07-04 (×2): qty 90, 90d supply, fill #1
  Filled 2022-10-15: qty 90, 90d supply, fill #2
  Filled 2023-01-11: qty 90, 90d supply, fill #3

## 2022-04-10 MED ORDER — LINZESS 290 MCG PO CAPS
ORAL_CAPSULE | ORAL | 3 refills | Status: DC
  Filled 2022-04-10: qty 90, 90d supply, fill #0
  Filled 2022-06-18 – 2022-07-04 (×2): qty 90, 90d supply, fill #1
  Filled 2022-10-15: qty 90, 90d supply, fill #2
  Filled 2023-01-11: qty 90, 90d supply, fill #3

## 2022-04-11 ENCOUNTER — Other Ambulatory Visit (HOSPITAL_BASED_OUTPATIENT_CLINIC_OR_DEPARTMENT_OTHER): Payer: Self-pay

## 2022-04-26 ENCOUNTER — Telehealth: Payer: Commercial Managed Care - PPO | Admitting: Nurse Practitioner

## 2022-04-26 ENCOUNTER — Other Ambulatory Visit (HOSPITAL_BASED_OUTPATIENT_CLINIC_OR_DEPARTMENT_OTHER): Payer: Self-pay

## 2022-04-26 DIAGNOSIS — M542 Cervicalgia: Secondary | ICD-10-CM | POA: Diagnosis not present

## 2022-04-26 DIAGNOSIS — R399 Unspecified symptoms and signs involving the genitourinary system: Secondary | ICD-10-CM | POA: Diagnosis not present

## 2022-04-26 MED ORDER — CYCLOBENZAPRINE HCL 10 MG PO TABS
10.0000 mg | ORAL_TABLET | Freq: Three times a day (TID) | ORAL | 1 refills | Status: DC | PRN
  Filled 2022-04-26: qty 30, 10d supply, fill #0

## 2022-04-26 MED ORDER — NAPROXEN 500 MG PO TABS
500.0000 mg | ORAL_TABLET | Freq: Two times a day (BID) | ORAL | 1 refills | Status: DC
  Filled 2022-04-26: qty 60, 30d supply, fill #0

## 2022-04-26 MED ORDER — CEPHALEXIN 500 MG PO CAPS
500.0000 mg | ORAL_CAPSULE | Freq: Two times a day (BID) | ORAL | 0 refills | Status: DC
Start: 2022-04-26 — End: 2022-06-01
  Filled 2022-04-26: qty 14, 7d supply, fill #0

## 2022-04-26 NOTE — Progress Notes (Signed)
Virtual Visit Consent   Marie Perez Tal, you are scheduled for a virtual visit with Marie Daphine DeutscherMartin, FNP, a [facility] provider, today.     Just as with appointments in the office, your consent must be obtained to participate.  Your consent will be active for this visit and any virtual visit you may have with one of our providers in the next 365 days.     If you have a MyChart account, a copy of this consent can be sent to you electronically.  All virtual visits are billed to your insurance company just like a traditional visit in the office.    As this is a virtual visit, video technology does not allow for your provider to perform a traditional examination.  This may limit your provider's ability to fully assess your condition.  If your provider identifies any concerns that need to be evaluated in person or the need to arrange testing (such as labs, EKG, etc.), we will make arrangements to do so.     Although advances in technology are sophisticated, we cannot ensure that it will always work on either your end or our end.  If the connection with a video visit is poor, the visit may have to be switched to a telephone visit.  With either a video or telephone visit, we are not always able to ensure that we have a secure connection.     I need to obtain your verbal consent now.   Are you willing to proceed with your visit today? YES   Marie Perez Bradham has provided verbal consent on 04/26/2022 for a virtual visit (video or telephone).   Marie Daphine DeutscherMartin, FNP   Date: 04/26/2022 8:38 AM   Virtual Visit via Video Note   I, Marie Perez, connected with Marie Perez Terlecki ([id number], 02-26-1975) on 04/26/22 at  8:45 AM EDT by a video-enabled telemedicine application and verified that I am speaking with the correct person using two identifiers.  Location: Patient: Virtual Visit Location Patient: Home Provider: Virtual Visit Location Provider: Mobile   I discussed the limitations  of evaluation and management by telemedicine and the availability of in person appointments. The patient expressed understanding and agreed to proceed.    History of Present Illness: Marie Perez is a 47 y.o. who identifies as a female who was assigned female at birth, and is being seen today for several complaints.  HPI: Patient has several complaints today: - neck pain - UTI symptoms  Neck Pain  This is a new problem. The current episode started in the past 7 days. The problem occurs intermittently. The problem has been waxing and waning. The pain is associated with nothing. The pain is present in the midline. The quality of the pain is described as aching. The symptoms are aggravated by bending. The pain is Worse during the night. Pertinent negatives include no fever or headaches. She has tried NSAIDs for the symptoms. The treatment provided mild relief.  Urinary Tract Infection  The current episode started in the past 7 days. The problem occurs every urination. The problem has been waxing and waning. The pain is at a severity of 8/10. She is Sexually active. Associated symptoms include hesitancy and urgency. Pertinent negatives include no flank pain. She has tried nothing for the symptoms. The treatment provided mild relief.    Review of Systems  Constitutional:  Negative for fever.  Genitourinary:  Positive for hesitancy and urgency. Negative for flank pain.  Musculoskeletal:  Positive for  neck pain.  Neurological:  Negative for headaches.     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    Allergies: No Known Allergies Medications:  Current Outpatient  Medications:    atorvastatin (LIPITOR) 20 MG tablet, Take 1 tablet (20 mg total) by mouth daily., Disp: 90 tablet, Rfl: 3   benzonatate (TESSALON) 100 MG capsule, Take 1 capsule (100 mg total) by mouth 3 (three) times daily as needed for cough., Disp: 20 capsule, Rfl: 0   brompheniramine-pseudoephedrine-DM 30-2-10 MG/5ML syrup, Take 5 mLs by mouth 4 (four) times daily as needed., Disp: 118 mL, Rfl: 0   cetirizine (ZYRTEC) 10 MG tablet, Take 1 tablet by mouth every day, Disp: 90 tablet, Rfl: 3   dexlansoprazole (DEXILANT) 60 MG capsule, Take 1 capsule (60 mg total) by mouth daily., Disp: 30 capsule, Rfl: 5   esomeprazole (NEXIUM) 20 MG capsule, TAKE 1 CAPSULE(20 MG) BY MOUTH DAILY, Disp: 30 capsule, Rfl: 2   fluconazole (DIFLUCAN) 150 MG tablet, Take 1 tablet (150 mg total) by mouth every 3 (three) days as needed., Disp: 2 tablet, Rfl: 0   fluticasone (FLONASE ALLERGY RELIEF) 50 MCG/ACT nasal spray, Use 1 spray in each nostril once a day as needed., Disp: 48 g, Rfl: 0   fluticasone (FLONASE) 50 MCG/ACT nasal spray, Place 2 sprays into both nostrils daily as needed. For allergies, Disp: , Rfl:    ibuprofen (ADVIL,MOTRIN) 600 MG tablet, Take 1 tablet (600 mg total) by mouth every 6 (six) hours as needed., Disp: 30 tablet, Rfl: 0   influenza vac split quadrivalent PF (FLUARIX) 0.5 ML injection, Inject into the muscle., Disp: 0.5 mL, Rfl: 0   Insulin Pen Needle (NOVOFINE PEN NEEDLE) 32G X 6 MM MISC, Use with saxenda, Disp: 50 each, Rfl: 2   linaclotide (LINZESS) 290 MCG CAPS capsule, Take 1 capsule by mouth on an empty stoamch at least 30 minutes before the first meal of the day, Disp: 90 capsule, Rfl: 3   Liraglutide -Weight Management (SAXENDA) 18 MG/3ML SOPN, Inject 0.6 mg under the skin every day for 1 week,then increase dose by 0.6 mg every week up to 1.8 mg a day, Disp: 3 mL, Rfl: 1   lubiprostone (AMITIZA) 24 MCG capsule, Take 1 capsule by mouth twice daily with a meal, Disp: 60 capsule, Rfl: 4    montelukast (SINGULAIR) 10 MG tablet, 1 tablet Orally every other day 90 days, Disp: 45 tablet, Rfl: 4   montelukast (SINGULAIR) 10 MG tablet, 1 tablet Orally Once a day 90 days, Disp: 90 tablet, Rfl: 3   omeprazole (PRILOSEC) 40 MG capsule, Take 1 capsule (40 mg total) by mouth daily 30 mintues before breakfast., Disp: 90 capsule, Rfl: 0   pantoprazole (PROTONIX) 40 MG tablet, Take 1 tablet (40 mg total) by mouth daily., Disp: 90 tablet, Rfl: 1   predniSONE (DELTASONE) 20 MG tablet, Take 2 tablets by mouth every day for 5 days, Disp: 10 tablet, Rfl: 0   promethazine-dextromethorphan (PROMETHAZINE-DM) 6.25-15 MG/5ML syrup, Take 5 mLs by mouth 3 (three) times daily as needed for cough., Disp: 118 mL, Rfl: 0   SAXENDA 18 MG/3ML SOPN, ADMINISTER 3 MG UNDER THE SKIN DAILY, Disp: 9  mL, Rfl: 1   Semaglutide-Weight Management (WEGOVY) 0.5 MG/0.5ML SOAJ, 0.5 mL Subcutaneous Once a week 30 day(s), Disp: 2 mL, Rfl: 0   Semaglutide-Weight Management (WEGOVY) 1 MG/0.5ML SOAJ, 1 mL Subcutaneous Once a week 30 day(s) once a week, Disp: 2 mL, Rfl: 1   Semaglutide-Weight Management (WEGOVY) 1.7 MG/0.75ML SOAJ, 0.75 ml Subcutaneous once a week 30 day(s), Disp: 3 mL, Rfl: 0   Semaglutide-Weight Management (WEGOVY) 2.4 MG/0.75ML SOAJ, Inject 0.75 ml Subcutaneous once a week 30 days, Disp: 3 mL, Rfl: 5   Semaglutide-Weight Management (WEGOVY) 2.4 MG/0.75ML SOAJ, Inject 2.4 mg into the skin once a week., Disp: 9 mL, Rfl: 3   Semaglutide-Weight Management 0.25 MG/0.5ML SOAJ, Inject 0.25 mg (0.69ml) under the skin as directed, Disp: 2 mL, Rfl: 2   terconazole (TERAZOL 7) 0.4 % vaginal cream, Apply topically to external tissues as directed for 5-7 days as needed, Disp: 45 g, Rfl: 0   valsartan-hydrochlorothiazide (DIOVAN-HCT) 160-12.5 MG tablet, Take 1 tablet by mouth every day, Disp: 90 tablet, Rfl: 4   valsartan-hydrochlorothiazide (DIOVAN-HCT) 160-12.5 MG tablet, Take 1 tablet by mouth daily., Disp: 90 tablet, Rfl: 3    Vitamin D, Ergocalciferol, 50000 units CAPS, Take 1 capsule by mouth once a week., Disp: 12 capsule, Rfl: 3  Observations/Objective: Patient is well-developed, well-nourished in no acute distress.  Resting comfortably  at home.  Head is normocephalic, atraumatic.  No labored breathing.  Speech is clear and coherent with logical content.  Patient is alert and oriented at baseline.  Pain on flexion and extension of cervical spine No suprapubic pressure  Assessment and Plan:  Marie Heck in today with chief complaint of No chief complaint on file.   1. Neck pain Moist heat rest - naproxen (NAPROSYN) 500 MG tablet; Take 1 tablet (500 mg total) by mouth 2 (two) times daily with a meal.  Dispense: 60 tablet; Refill: 1 - cyclobenzaprine (FLEXERIL) 10 MG tablet; Take 1 tablet (10 mg total) by mouth 3 (three) times daily as needed for muscle spasms.  Dispense: 30 tablet; Refill: 1  2. UTI symptoms Take medication as prescribe Cotton underwear Take shower not bath Cranberry juice, yogurt Force fluids AZO over the counter X2 days RTO prn  - cephALEXin (KEFLEX) 500 MG capsule; Take 1 capsule (500 mg total) by mouth 2 (two) times daily.  Dispense: 14 capsule; Refill: 0    Follow Up Instructions: I discussed the assessment and treatment plan with the patient. The patient was provided an opportunity to ask questions and all were answered. The patient agreed with the plan and demonstrated an understanding of the instructions.  A copy of instructions were sent to the patient via MyChart.  The patient was advised to call back or seek an in-person evaluation if the symptoms worsen or if the condition fails to improve as anticipated.  Time:  I spent 8 minutes with the patient via telehealth technology discussing the above problems/concerns.    Marie Daphine Deutscher, FNP

## 2022-04-26 NOTE — Patient Instructions (Signed)
Marie Perez, thank you for joining Bennie Pierini, FNP for today's virtual visit.  While this provider is not your primary care provider (PCP), if your PCP is located in our provider database this encounter information will be shared with them immediately following your visit.   A [facility] MyChart account gives you access to today's visit and all your visits, tests, and labs performed at [facility] " click here if you don't have a [facility] MyChart account or go to [email or web address]  Consent: (Patient) Marie Perez provided verbal consent for this virtual visit at the beginning of the encounter.  Current Medications:  Current Outpatient Medications:    cephALEXin (KEFLEX) 500 MG capsule, Take 1 capsule (500 mg total) by mouth 2 (two) times daily., Disp: 14 capsule, Rfl: 0   cyclobenzaprine (FLEXERIL) 10 MG tablet, Take 1 tablet (10 mg total) by mouth 3 (three) times daily as needed for muscle spasms., Disp: 30 tablet, Rfl: 1   naproxen (NAPROSYN) 500 MG tablet, Take 1 tablet (500 mg total) by mouth 2 (two) times daily with a meal., Disp: 60 tablet, Rfl: 1   atorvastatin (LIPITOR) 20 MG tablet, Take 1 tablet (20 mg total) by mouth daily., Disp: 90 tablet, Rfl: 3   benzonatate (TESSALON) 100 MG capsule, Take 1 capsule (100 mg total) by mouth 3 (three) times daily as needed for cough., Disp: 20 capsule, Rfl: 0   brompheniramine-pseudoephedrine-DM 30-2-10 MG/5ML syrup, Take 5 mLs by mouth 4 (four) times daily as needed., Disp: 118 mL, Rfl: 0   cetirizine (ZYRTEC) 10 MG tablet, Take 1 tablet by mouth every day, Disp: 90 tablet, Rfl: 3   dexlansoprazole (DEXILANT) 60 MG capsule, Take 1 capsule (60 mg total) by mouth daily., Disp: 30 capsule, Rfl: 5   esomeprazole (NEXIUM) 20 MG capsule, TAKE 1 CAPSULE(20 MG) BY MOUTH DAILY, Disp: 30 capsule, Rfl: 2   fluconazole (DIFLUCAN) 150 MG tablet, Take 1 tablet (150 mg total) by mouth every 3 (three) days as needed.,  Disp: 2 tablet, Rfl: 0   fluticasone (FLONASE ALLERGY RELIEF) 50 MCG/ACT nasal spray, Use 1 spray in each nostril once a day as needed., Disp: 48 g, Rfl: 0   fluticasone (FLONASE) 50 MCG/ACT nasal spray, Place 2 sprays into both nostrils daily as needed. For allergies, Disp: , Rfl:    ibuprofen (ADVIL,MOTRIN) 600 MG tablet, Take 1 tablet (600 mg total) by mouth every 6 (six) hours as needed., Disp: 30 tablet, Rfl: 0   influenza vac split quadrivalent PF (FLUARIX) 0.5 ML injection, Inject into the muscle., Disp: 0.5 mL, Rfl: 0   Insulin Pen Needle (NOVOFINE PEN NEEDLE) 32G X 6 MM MISC, Use with saxenda, Disp: 50 each, Rfl: 2   linaclotide (LINZESS) 290 MCG CAPS capsule, Take 1 capsule by mouth on an empty stoamch at least 30 minutes before the first meal of the day, Disp: 90 capsule, Rfl: 3   Liraglutide -Weight Management (SAXENDA) 18 MG/3ML SOPN, Inject 0.6 mg under the skin every day for 1 week,then increase dose by 0.6 mg every week up to 1.8 mg a day, Disp: 3 mL, Rfl: 1   lubiprostone (AMITIZA) 24 MCG capsule, Take 1 capsule by mouth twice daily with a meal, Disp: 60 capsule, Rfl: 4   montelukast (SINGULAIR) 10 MG tablet, 1 tablet Orally every other day 90 days, Disp: 45 tablet, Rfl: 4   montelukast (SINGULAIR) 10 MG tablet, 1 tablet Orally Once a day 90 days, Disp: 90 tablet, Rfl: 3  omeprazole (PRILOSEC) 40 MG capsule, Take 1 capsule (40 mg total) by mouth daily 30 mintues before breakfast., Disp: 90 capsule, Rfl: 0   pantoprazole (PROTONIX) 40 MG tablet, Take 1 tablet (40 mg total) by mouth daily., Disp: 90 tablet, Rfl: 1   predniSONE (DELTASONE) 20 MG tablet, Take 2 tablets by mouth every day for 5 days, Disp: 10 tablet, Rfl: 0   promethazine-dextromethorphan (PROMETHAZINE-DM) 6.25-15 MG/5ML syrup, Take 5 mLs by mouth 3 (three) times daily as needed for cough., Disp: 118 mL, Rfl: 0   SAXENDA 18 MG/3ML SOPN, ADMINISTER 3 MG UNDER THE SKIN DAILY, Disp: 9 mL, Rfl: 1   Semaglutide-Weight  Management (WEGOVY) 0.5 MG/0.5ML SOAJ, 0.5 mL Subcutaneous Once a week 30 day(s), Disp: 2 mL, Rfl: 0   Semaglutide-Weight Management (WEGOVY) 1 MG/0.5ML SOAJ, 1 mL Subcutaneous Once a week 30 day(s) once a week, Disp: 2 mL, Rfl: 1   Semaglutide-Weight Management (WEGOVY) 1.7 MG/0.75ML SOAJ, 0.75 ml Subcutaneous once a week 30 day(s), Disp: 3 mL, Rfl: 0   Semaglutide-Weight Management (WEGOVY) 2.4 MG/0.75ML SOAJ, Inject 0.75 ml Subcutaneous once a week 30 days, Disp: 3 mL, Rfl: 5   Semaglutide-Weight Management (WEGOVY) 2.4 MG/0.75ML SOAJ, Inject 2.4 mg into the skin once a week., Disp: 9 mL, Rfl: 3   Semaglutide-Weight Management 0.25 MG/0.5ML SOAJ, Inject 0.25 mg (0.18ml) under the skin as directed, Disp: 2 mL, Rfl: 2   terconazole (TERAZOL 7) 0.4 % vaginal cream, Apply topically to external tissues as directed for 5-7 days as needed, Disp: 45 g, Rfl: 0   valsartan-hydrochlorothiazide (DIOVAN-HCT) 160-12.5 MG tablet, Take 1 tablet by mouth every day, Disp: 90 tablet, Rfl: 4   valsartan-hydrochlorothiazide (DIOVAN-HCT) 160-12.5 MG tablet, Take 1 tablet by mouth daily., Disp: 90 tablet, Rfl: 3   Vitamin D, Ergocalciferol, 50000 units CAPS, Take 1 capsule by mouth once a week., Disp: 12 capsule, Rfl: 3   Medications ordered in this encounter:  Meds ordered this encounter  Medications   naproxen (NAPROSYN) 500 MG tablet    Sig: Take 1 tablet (500 mg total) by mouth 2 (two) times daily with a meal.    Dispense:  60 tablet    Refill:  1    Order Specific Question:   Supervising Provider    Answer:   Merrilee Jansky [[id number]]   cyclobenzaprine (FLEXERIL) 10 MG tablet    Sig: Take 1 tablet (10 mg total) by mouth 3 (three) times daily as needed for muscle spasms.    Dispense:  30 tablet    Refill:  1    Order Specific Question:   Supervising Provider    Answer:   Merrilee Jansky [[id number]]   cephALEXin (KEFLEX) 500 MG capsule    Sig: Take 1 capsule (500 mg total) by mouth 2 (two) times daily.     Dispense:  14 capsule    Refill:  0    Order Specific Question:   Supervising Provider    Answer:   Merrilee Jansky [id number]     *If you need refills on other medications prior to your next appointment, please contact your pharmacy*  Follow-Up: Call back or seek an in-person evaluation if the symptoms worsen or if the condition fails to improve as anticipated.  [facility] [phone]  Other Instructions Moist heat to neck Take medication as prescribe Cotton underwear Take shower not bath Cranberry juice, yogurt Force fluids AZO over the counter X2 days RTO prn    If you  have been instructed to have an in-person evaluation today at a local Urgent Care facility, please use the link below. It will take you to a list of all of our available [facility], including address, phone number and hours of operation. Please do not delay care.  [facility]  If you or a family member do not have a primary care provider, use the link below to schedule a visit and establish care. When you choose a [facility] primary care physician or advanced practice provider, you gain a long-term partner in health. Find a Primary Care Provider  Learn more about [facility]'s in-office and virtual care options: [facility]

## 2022-05-03 ENCOUNTER — Other Ambulatory Visit (HOSPITAL_BASED_OUTPATIENT_CLINIC_OR_DEPARTMENT_OTHER): Payer: Self-pay

## 2022-05-03 DIAGNOSIS — I10 Essential (primary) hypertension: Secondary | ICD-10-CM | POA: Diagnosis not present

## 2022-05-03 DIAGNOSIS — E7800 Pure hypercholesterolemia, unspecified: Secondary | ICD-10-CM | POA: Diagnosis not present

## 2022-05-03 DIAGNOSIS — K5900 Constipation, unspecified: Secondary | ICD-10-CM | POA: Diagnosis not present

## 2022-05-03 DIAGNOSIS — Z3202 Encounter for pregnancy test, result negative: Secondary | ICD-10-CM | POA: Diagnosis not present

## 2022-05-03 DIAGNOSIS — Z713 Dietary counseling and surveillance: Secondary | ICD-10-CM | POA: Diagnosis not present

## 2022-05-03 DIAGNOSIS — E639 Nutritional deficiency, unspecified: Secondary | ICD-10-CM | POA: Diagnosis not present

## 2022-05-03 DIAGNOSIS — R5383 Other fatigue: Secondary | ICD-10-CM | POA: Diagnosis not present

## 2022-05-03 DIAGNOSIS — Z724 Inappropriate diet and eating habits: Secondary | ICD-10-CM | POA: Diagnosis not present

## 2022-05-03 DIAGNOSIS — K219 Gastro-esophageal reflux disease without esophagitis: Secondary | ICD-10-CM | POA: Diagnosis not present

## 2022-05-03 MED ORDER — QSYMIA 3.75-23 MG PO CP24
1.0000 | ORAL_CAPSULE | Freq: Every day | ORAL | 0 refills | Status: DC
  Filled 2022-05-03: qty 45, 30d supply, fill #0

## 2022-05-06 ENCOUNTER — Other Ambulatory Visit (HOSPITAL_BASED_OUTPATIENT_CLINIC_OR_DEPARTMENT_OTHER): Payer: Self-pay

## 2022-05-06 MED ORDER — VALSARTAN-HYDROCHLOROTHIAZIDE 320-12.5 MG PO TABS
1.0000 | ORAL_TABLET | Freq: Every day | ORAL | 0 refills | Status: DC
  Filled 2022-05-06: qty 30, 30d supply, fill #0

## 2022-05-15 ENCOUNTER — Other Ambulatory Visit (HOSPITAL_BASED_OUTPATIENT_CLINIC_OR_DEPARTMENT_OTHER): Payer: Self-pay

## 2022-05-15 DIAGNOSIS — Z724 Inappropriate diet and eating habits: Secondary | ICD-10-CM | POA: Diagnosis not present

## 2022-05-15 DIAGNOSIS — K5900 Constipation, unspecified: Secondary | ICD-10-CM | POA: Diagnosis not present

## 2022-05-15 DIAGNOSIS — E7800 Pure hypercholesterolemia, unspecified: Secondary | ICD-10-CM | POA: Diagnosis not present

## 2022-05-15 DIAGNOSIS — Z3202 Encounter for pregnancy test, result negative: Secondary | ICD-10-CM | POA: Diagnosis not present

## 2022-05-15 DIAGNOSIS — Z713 Dietary counseling and surveillance: Secondary | ICD-10-CM | POA: Diagnosis not present

## 2022-05-15 DIAGNOSIS — R5383 Other fatigue: Secondary | ICD-10-CM | POA: Diagnosis not present

## 2022-05-15 DIAGNOSIS — E639 Nutritional deficiency, unspecified: Secondary | ICD-10-CM | POA: Diagnosis not present

## 2022-05-15 DIAGNOSIS — K219 Gastro-esophageal reflux disease without esophagitis: Secondary | ICD-10-CM | POA: Diagnosis not present

## 2022-05-15 DIAGNOSIS — I10 Essential (primary) hypertension: Secondary | ICD-10-CM | POA: Diagnosis not present

## 2022-05-15 MED ORDER — PHENTERMINE HCL 37.5 MG PO TABS
37.5000 mg | ORAL_TABLET | Freq: Every day | ORAL | 0 refills | Status: DC
  Filled 2022-05-15: qty 7, 7d supply, fill #0

## 2022-05-15 MED ORDER — WEGOVY 2.4 MG/0.75ML ~~LOC~~ SOAJ
2.4000 mg | SUBCUTANEOUS | 3 refills | Status: DC
  Filled 2022-05-15 – 2022-05-17 (×2): qty 3, 28d supply, fill #0
  Filled 2022-07-14: qty 3, 28d supply, fill #1

## 2022-05-16 ENCOUNTER — Other Ambulatory Visit (HOSPITAL_BASED_OUTPATIENT_CLINIC_OR_DEPARTMENT_OTHER): Payer: Self-pay

## 2022-05-17 ENCOUNTER — Other Ambulatory Visit (HOSPITAL_BASED_OUTPATIENT_CLINIC_OR_DEPARTMENT_OTHER): Payer: Self-pay

## 2022-05-19 ENCOUNTER — Other Ambulatory Visit (HOSPITAL_BASED_OUTPATIENT_CLINIC_OR_DEPARTMENT_OTHER): Payer: Self-pay

## 2022-05-22 ENCOUNTER — Other Ambulatory Visit (HOSPITAL_BASED_OUTPATIENT_CLINIC_OR_DEPARTMENT_OTHER): Payer: Self-pay

## 2022-05-22 DIAGNOSIS — Z3202 Encounter for pregnancy test, result negative: Secondary | ICD-10-CM | POA: Diagnosis not present

## 2022-05-22 DIAGNOSIS — Z724 Inappropriate diet and eating habits: Secondary | ICD-10-CM | POA: Diagnosis not present

## 2022-05-22 DIAGNOSIS — K219 Gastro-esophageal reflux disease without esophagitis: Secondary | ICD-10-CM | POA: Diagnosis not present

## 2022-05-22 DIAGNOSIS — E639 Nutritional deficiency, unspecified: Secondary | ICD-10-CM | POA: Diagnosis not present

## 2022-05-22 DIAGNOSIS — Z713 Dietary counseling and surveillance: Secondary | ICD-10-CM | POA: Diagnosis not present

## 2022-05-22 DIAGNOSIS — I10 Essential (primary) hypertension: Secondary | ICD-10-CM | POA: Diagnosis not present

## 2022-05-22 DIAGNOSIS — E7800 Pure hypercholesterolemia, unspecified: Secondary | ICD-10-CM | POA: Diagnosis not present

## 2022-05-22 DIAGNOSIS — R5383 Other fatigue: Secondary | ICD-10-CM | POA: Diagnosis not present

## 2022-05-22 DIAGNOSIS — K5900 Constipation, unspecified: Secondary | ICD-10-CM | POA: Diagnosis not present

## 2022-05-22 MED ORDER — PHENTERMINE HCL 37.5 MG PO TABS
37.5000 mg | ORAL_TABLET | Freq: Every day | ORAL | 0 refills | Status: DC
  Filled 2022-05-22: qty 7, 7d supply, fill #0

## 2022-05-23 ENCOUNTER — Other Ambulatory Visit (HOSPITAL_BASED_OUTPATIENT_CLINIC_OR_DEPARTMENT_OTHER): Payer: Self-pay

## 2022-05-26 ENCOUNTER — Other Ambulatory Visit (HOSPITAL_BASED_OUTPATIENT_CLINIC_OR_DEPARTMENT_OTHER): Payer: Self-pay

## 2022-06-01 ENCOUNTER — Other Ambulatory Visit (HOSPITAL_BASED_OUTPATIENT_CLINIC_OR_DEPARTMENT_OTHER): Payer: Self-pay

## 2022-06-01 ENCOUNTER — Telehealth: Payer: Commercial Managed Care - PPO | Admitting: Family

## 2022-06-01 DIAGNOSIS — B3731 Acute candidiasis of vulva and vagina: Secondary | ICD-10-CM

## 2022-06-01 MED ORDER — FLUCONAZOLE 150 MG PO TABS
150.0000 mg | ORAL_TABLET | ORAL | 0 refills | Status: DC | PRN
Start: 2022-06-01 — End: 2022-08-20
  Filled 2022-06-01: qty 2, 6d supply, fill #0

## 2022-06-01 NOTE — Progress Notes (Signed)
Virtual Visit Consent   Marie Perez, you are scheduled for a virtual visit with a [facility] provider today. Just as with appointments in the office, your consent must be obtained to participate. Your consent will be active for this visit and any virtual visit you may have with one of our providers in the next 365 days. If you have a MyChart account, a copy of this consent can be sent to you electronically.  As this is a virtual visit, video technology does not allow for your provider to perform a traditional examination. This may limit your provider's ability to fully assess your condition. If your provider identifies any concerns that need to be evaluated in person or the need to arrange testing (such as labs, EKG, etc.), we will make arrangements to do so. Although advances in technology are sophisticated, we cannot ensure that it will always work on either your end or our end. If the connection with a video visit is poor, the visit may have to be switched to a telephone visit. With either a video or telephone visit, we are not always able to ensure that we have a secure connection.  By engaging in this virtual visit, you consent to the provision of healthcare and authorize for your insurance to be billed (if applicable) for the services provided during this visit. Depending on your insurance coverage, you may receive a charge related to this service.  I need to obtain your verbal consent now. Are you willing to proceed with your visit today? Marie Perez has provided verbal consent on 06/01/2022 for a virtual visit (video or telephone). Jannifer Rodney, FNP  Date: 06/01/2022 8:24 AM  Virtual Visit via Video Note   I, Jannifer Rodney, connected with  Marie Perez  ([id number], [birth year]) on 06/01/22 at  8:30 AM EDT by a video-enabled telemedicine application and verified that I am speaking with the correct person using two identifiers.  Location: Patient: Virtual Visit Location Patient:  Home Provider: Virtual Visit Location Provider: Home Office   I discussed the limitations of evaluation and management by telemedicine and the availability of in person appointments. The patient expressed understanding and agreed to proceed.    History of Present Illness: Marie Perez is a 47 y.o. who identifies as a female who was assigned female at birth, and is being seen today for vaginal itching that started three days ago.  HPI: Vaginal Itching The patient's primary symptoms include genital itching. The patient's pertinent negatives include no genital odor or vaginal discharge. The current episode started in the past 7 days. The problem occurs intermittently. The pain is mild. Pertinent negatives include no constipation, dysuria or painful intercourse. She has tried nothing for the symptoms. The treatment provided no relief.    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    Allergies: No Known Allergies Medications:  Current Outpatient Medications:    fluconazole (DIFLUCAN) 150 MG tablet, Take 1 tablet (150 mg total) by mouth every three (3) days as needed., Disp: 2 tablet, Rfl: 0   atorvastatin (LIPITOR) 20 MG tablet, Take 1 tablet (20 mg total) by mouth daily., Disp: 90 tablet, Rfl: 3   cetirizine (ZYRTEC)  10 MG tablet, Take 1 tablet by mouth every day, Disp: 90 tablet, Rfl: 3   cyclobenzaprine (FLEXERIL) 10 MG tablet, Take 1 tablet (10 mg total) by mouth 3 (three) times daily as needed for muscle spasms., Disp: 30 tablet, Rfl: 1   dexlansoprazole (DEXILANT) 60 MG capsule, Take 1 capsule (60 mg total) by mouth daily., Disp: 30 capsule, Rfl: 5   esomeprazole  (NEXIUM) 20 MG capsule, TAKE 1 CAPSULE(20 MG) BY MOUTH DAILY, Disp: 30 capsule, Rfl: 2   fluticasone (FLONASE ALLERGY RELIEF) 50 MCG/ACT nasal spray, Use 1 spray in each nostril once a day as needed., Disp: 48 g, Rfl: 0   fluticasone (FLONASE) 50 MCG/ACT nasal spray, Place 2 sprays into both nostrils daily as needed. For allergies, Disp: , Rfl:    ibuprofen (ADVIL,MOTRIN) 600 MG tablet, Take 1 tablet (600 mg total) by mouth every 6 (six) hours as needed., Disp: 30 tablet, Rfl: 0   influenza vac split quadrivalent PF (FLUARIX) 0.5 ML injection, Inject into the muscle., Disp: 0.5 mL, Rfl: 0   Insulin Pen Needle (NOVOFINE PEN NEEDLE) 32G X 6 MM MISC, Use with saxenda, Disp: 50 each, Rfl: 2   linaclotide (LINZESS) 290 MCG CAPS capsule, Take 1 capsule by mouth on an empty stoamch at least 30 minutes before the first meal of the day, Disp: 90 capsule, Rfl: 3   Liraglutide -Weight Management (SAXENDA) 18 MG/3ML SOPN, Inject 0.6 mg under the skin every day for 1 week,then increase dose by 0.6 mg every week up to 1.8 mg a day, Disp: 3 mL, Rfl: 1   lubiprostone (AMITIZA) 24 MCG capsule, Take 1 capsule by mouth twice daily with a meal, Disp: 60 capsule, Rfl: 4   montelukast (SINGULAIR) 10 MG tablet, 1 tablet Orally every other day 90 days, Disp: 45 tablet, Rfl: 4   montelukast (SINGULAIR) 10 MG tablet, 1 tablet Orally Once a day 90 days, Disp: 90 tablet, Rfl: 3   naproxen (NAPROSYN) 500 MG tablet, Take 1 tablet (500 mg total) by mouth 2 (two) times daily with a meal., Disp: 60 tablet, Rfl: 1   omeprazole (PRILOSEC) 40 MG capsule, Take 1 capsule (40 mg total) by mouth daily 30 mintues before breakfast., Disp: 90 capsule, Rfl: 0   pantoprazole (PROTONIX) 40 MG tablet, Take 1 tablet (40 mg total) by mouth daily., Disp: 90 tablet, Rfl: 1   phentermine (ADIPEX-P) 37.5 MG tablet, Take 1 tablet (37.5 mg total) by mouth daily before breakfast., Disp: 7 tablet, Rfl: 0   predniSONE (DELTASONE) 20 MG tablet, Take 2 tablets  by mouth every day for 5 days, Disp: 10 tablet, Rfl: 0   promethazine-dextromethorphan (PROMETHAZINE-DM) 6.25-15 MG/5ML syrup, Take 5 mLs by mouth 3 (three) times daily as needed for cough., Disp: 118 mL, Rfl: 0   SAXENDA 18 MG/3ML SOPN, ADMINISTER 3 MG UNDER THE SKIN DAILY, Disp: 9 mL, Rfl: 1   Semaglutide-Weight Management (WEGOVY) 0.5 MG/0.5ML SOAJ, 0.5 mL Subcutaneous Once a week 30 day(s), Disp: 2 mL, Rfl: 0   Semaglutide-Weight Management (WEGOVY) 1 MG/0.5ML SOAJ, 1 mL Subcutaneous Once a week 30 day(s) once a week, Disp: 2 mL, Rfl: 1   Semaglutide-Weight Management (WEGOVY) 2.4 MG/0.75ML SOAJ, Inject 0.75 ml Subcutaneous once a week 30 days, Disp: 3 mL, Rfl: 5   Semaglutide-Weight Management (WEGOVY) 2.4 MG/0.75ML SOAJ, Inject 2.4 mg into the skin once a week., Disp: 9 mL, Rfl: 3   Semaglutide-Weight Management (WEGOVY) 2.4 MG/0.75ML SOAJ, Inject 2.4 mg into the skin once  a week., Disp: 9 mL, Rfl: 3   Semaglutide-Weight Management 0.25 MG/0.5ML SOAJ, Inject 0.25 mg (0.73ml) under the skin as directed, Disp: 2 mL, Rfl: 2   terconazole (TERAZOL 7) 0.4 % vaginal cream, Apply topically to external tissues as directed for 5-7 days as needed, Disp: 45 g, Rfl: 0   valsartan-hydrochlorothiazide (DIOVAN-HCT) 160-12.5 MG tablet, Take 1 tablet by mouth every day, Disp: 90 tablet, Rfl: 4   valsartan-hydrochlorothiazide (DIOVAN-HCT) 160-12.5 MG tablet, Take 1 tablet by mouth daily., Disp: 90 tablet, Rfl: 3   valsartan-hydrochlorothiazide (DIOVAN-HCT) 320-12.5 MG tablet, Take 1 tablet by mouth daily., Disp: 30 tablet, Rfl: 0   Vitamin D, Ergocalciferol, 50000 units CAPS, Take 1 capsule by mouth once a week., Disp: 12 capsule, Rfl: 3  Observations/Objective: Patient is well-developed, well-nourished in no acute distress.  Resting comfortably  at home.  Head is normocephalic, atraumatic.  No labored breathing.  Speech is clear and coherent with logical content.  Patient is alert and oriented at baseline.     Assessment and Plan: 1. Vagina, candidiasis - fluconazole (DIFLUCAN) 150 MG tablet; Take 1 tablet (150 mg total) by mouth every three (3) days as needed.  Dispense: 2 tablet; Refill: 0  Avoid scratching Keep clean and dry Diflucan every 3 days as needed Follow up if symptoms worsen or do not improve   Follow Up Instructions: I discussed the assessment and treatment plan with the patient. The patient was provided an opportunity to ask questions and all were answered. The patient agreed with the plan and demonstrated an understanding of the instructions.  A copy of instructions were sent to the patient via MyChart unless otherwise noted below.    The patient was advised to call back or seek an in-person evaluation if the symptoms worsen or if the condition fails to improve as anticipated.  Time:  I spent 8 minutes with the patient via telehealth technology discussing the above problems/concerns.    Jannifer Rodney, FNP

## 2022-06-02 ENCOUNTER — Other Ambulatory Visit (HOSPITAL_BASED_OUTPATIENT_CLINIC_OR_DEPARTMENT_OTHER): Payer: Self-pay

## 2022-06-03 ENCOUNTER — Other Ambulatory Visit (HOSPITAL_BASED_OUTPATIENT_CLINIC_OR_DEPARTMENT_OTHER): Payer: Self-pay

## 2022-06-04 ENCOUNTER — Other Ambulatory Visit (HOSPITAL_BASED_OUTPATIENT_CLINIC_OR_DEPARTMENT_OTHER): Payer: Self-pay

## 2022-06-04 MED ORDER — FLUTICASONE PROPIONATE 50 MCG/ACT NA SUSP
1.0000 | Freq: Every day | NASAL | 3 refills | Status: DC | PRN
  Filled 2022-06-04: qty 48, 90d supply, fill #0

## 2022-06-04 MED ORDER — VALSARTAN-HYDROCHLOROTHIAZIDE 320-12.5 MG PO TABS
1.0000 | ORAL_TABLET | Freq: Every day | ORAL | 3 refills | Status: DC
  Filled 2022-06-04: qty 90, 90d supply, fill #0
  Filled 2022-09-03: qty 90, 90d supply, fill #1
  Filled 2022-12-11: qty 90, 90d supply, fill #2
  Filled 2023-03-23: qty 90, 90d supply, fill #3

## 2022-06-08 ENCOUNTER — Other Ambulatory Visit (HOSPITAL_BASED_OUTPATIENT_CLINIC_OR_DEPARTMENT_OTHER): Payer: Self-pay

## 2022-06-09 ENCOUNTER — Other Ambulatory Visit (HOSPITAL_BASED_OUTPATIENT_CLINIC_OR_DEPARTMENT_OTHER): Payer: Self-pay

## 2022-06-10 ENCOUNTER — Other Ambulatory Visit (HOSPITAL_BASED_OUTPATIENT_CLINIC_OR_DEPARTMENT_OTHER): Payer: Self-pay

## 2022-06-18 ENCOUNTER — Other Ambulatory Visit (HOSPITAL_BASED_OUTPATIENT_CLINIC_OR_DEPARTMENT_OTHER): Payer: Self-pay

## 2022-06-18 MED ORDER — MONTELUKAST SODIUM 10 MG PO TABS
10.0000 mg | ORAL_TABLET | Freq: Every day | ORAL | 3 refills | Status: AC
  Filled 2022-06-18: qty 90, 90d supply, fill #0
  Filled 2022-09-13 (×2): qty 90, 90d supply, fill #1
  Filled 2022-12-26: qty 90, 90d supply, fill #2
  Filled 2023-04-01: qty 90, 90d supply, fill #3

## 2022-06-18 MED ORDER — OMEPRAZOLE 40 MG PO CPDR
40.0000 mg | DELAYED_RELEASE_CAPSULE | Freq: Every morning | ORAL | 0 refills | Status: DC
  Filled 2022-06-18: qty 90, 90d supply, fill #0

## 2022-06-19 DIAGNOSIS — R5383 Other fatigue: Secondary | ICD-10-CM | POA: Diagnosis not present

## 2022-06-19 DIAGNOSIS — Z713 Dietary counseling and surveillance: Secondary | ICD-10-CM | POA: Diagnosis not present

## 2022-06-19 DIAGNOSIS — Z723 Lack of physical exercise: Secondary | ICD-10-CM | POA: Diagnosis not present

## 2022-06-19 DIAGNOSIS — E7800 Pure hypercholesterolemia, unspecified: Secondary | ICD-10-CM | POA: Diagnosis not present

## 2022-06-19 DIAGNOSIS — E639 Nutritional deficiency, unspecified: Secondary | ICD-10-CM | POA: Diagnosis not present

## 2022-06-19 DIAGNOSIS — K5900 Constipation, unspecified: Secondary | ICD-10-CM | POA: Diagnosis not present

## 2022-06-19 DIAGNOSIS — I10 Essential (primary) hypertension: Secondary | ICD-10-CM | POA: Diagnosis not present

## 2022-06-19 DIAGNOSIS — K219 Gastro-esophageal reflux disease without esophagitis: Secondary | ICD-10-CM | POA: Diagnosis not present

## 2022-06-19 DIAGNOSIS — Z3202 Encounter for pregnancy test, result negative: Secondary | ICD-10-CM | POA: Diagnosis not present

## 2022-06-23 ENCOUNTER — Other Ambulatory Visit (HOSPITAL_COMMUNITY): Payer: Self-pay

## 2022-07-03 ENCOUNTER — Other Ambulatory Visit (HOSPITAL_BASED_OUTPATIENT_CLINIC_OR_DEPARTMENT_OTHER): Payer: Self-pay

## 2022-07-04 ENCOUNTER — Other Ambulatory Visit (HOSPITAL_BASED_OUTPATIENT_CLINIC_OR_DEPARTMENT_OTHER): Payer: Self-pay

## 2022-07-04 ENCOUNTER — Telehealth: Payer: Commercial Managed Care - PPO | Admitting: Physician Assistant

## 2022-07-04 DIAGNOSIS — R0982 Postnasal drip: Secondary | ICD-10-CM | POA: Diagnosis not present

## 2022-07-04 DIAGNOSIS — J302 Other seasonal allergic rhinitis: Secondary | ICD-10-CM

## 2022-07-04 MED ORDER — AZELASTINE HCL 0.1 % NA SOLN
1.0000 | Freq: Two times a day (BID) | NASAL | 0 refills | Status: DC
Start: 2022-07-04 — End: 2022-10-07
  Filled 2022-07-04: qty 30, 30d supply, fill #0

## 2022-07-04 NOTE — Patient Instructions (Signed)
Marie Perez, thank you for joining [name], PA-C for today's virtual visit.  While this provider is not your primary care provider (PCP), if your PCP is located in our provider database this encounter information will be shared with them immediately following your visit.   A [facility] MyChart account gives you access to today's visit and all your visits, tests, and labs performed at [facility] " click here if you don't have a [facility] MyChart account or go to [email or web address]  Consent: (Patient) Marie Perez provided verbal consent for this virtual visit at the beginning of the encounter.  Current Medications:  Current Outpatient Medications:    azelastine (ASTELIN) 0.1 % nasal spray, Place 1 spray into both nostrils 2 (two) times daily. Use in each nostril as directed, Disp: 30 mL, Rfl: 0   atorvastatin (LIPITOR) 20 MG tablet, Take 1 tablet (20 mg total) by mouth daily., Disp: 90 tablet, Rfl: 3   cetirizine (ZYRTEC) 10 MG tablet, Take 1 tablet by mouth every day, Disp: 90 tablet, Rfl: 3   cyclobenzaprine (FLEXERIL) 10 MG tablet, Take 1 tablet (10 mg total) by mouth 3 (three) times daily as needed for muscle spasms., Disp: 30 tablet, Rfl: 1   dexlansoprazole (DEXILANT) 60 MG capsule, Take 1 capsule (60 mg total) by mouth daily., Disp: 30 capsule, Rfl: 5   esomeprazole (NEXIUM) 20 MG capsule, TAKE 1 CAPSULE(20 MG) BY MOUTH DAILY, Disp: 30 capsule, Rfl: 2   fluconazole (DIFLUCAN) 150 MG tablet, Take 1 tablet (150 mg total) by mouth every three (3) days as needed., Disp: 2 tablet, Rfl: 0   fluticasone (FLONASE ALLERGY RELIEF) 50 MCG/ACT nasal spray, Place 1 spray into both nostrils daily as needed., Disp: 48 g, Rfl: 3   fluticasone (FLONASE) 50 MCG/ACT nasal spray, Place 2 sprays into both nostrils daily as needed. For allergies, Disp: , Rfl:    ibuprofen (ADVIL,MOTRIN) 600 MG tablet, Take 1 tablet (600 mg total) by mouth every 6 (six) hours as  needed., Disp: 30 tablet, Rfl: 0   influenza vac split quadrivalent PF (FLUARIX) 0.5 ML injection, Inject into the muscle., Disp: 0.5 mL, Rfl: 0   Insulin Pen Needle (NOVOFINE PEN NEEDLE) 32G X 6 MM MISC, Use with saxenda, Disp: 50 each, Rfl: 2   linaclotide (LINZESS) 290 MCG CAPS capsule, Take 1 capsule by mouth on an empty stoamch at least 30 minutes before the first meal of the day, Disp: 90 capsule, Rfl: 3   Liraglutide -Weight Management (SAXENDA) 18 MG/3ML SOPN, Inject 0.6 mg under the skin every day for 1 week,then increase dose by 0.6 mg every week up to 1.8 mg a day, Disp: 3 mL, Rfl: 1   lubiprostone (AMITIZA) 24 MCG capsule, Take 1 capsule by mouth twice daily with a meal, Disp: 60 capsule, Rfl: 4   montelukast (SINGULAIR) 10 MG tablet, 1 tablet Orally Once a day 90 days, Disp: 90 tablet, Rfl: 3   montelukast (SINGULAIR) 10 MG tablet, Take 1 tablet (10 mg total) by mouth daily., Disp: 90 tablet, Rfl: 3   naproxen (NAPROSYN) 500 MG tablet, Take 1 tablet (500 mg total) by mouth 2 (two) times daily with a meal., Disp: 60 tablet, Rfl: 1   omeprazole (PRILOSEC) 40 MG capsule, Take 1 capsule (40 mg total) by mouth every morning 30 minutes before breakfast, Disp: 90 capsule, Rfl: 0   pantoprazole (PROTONIX) 40 MG tablet, Take 1 tablet (40 mg total) by mouth daily., Disp: 90 tablet, Rfl:  1   phentermine (ADIPEX-P) 37.5 MG tablet, Take 1 tablet (37.5 mg total) by mouth daily before breakfast., Disp: 7 tablet, Rfl: 0   predniSONE (DELTASONE) 20 MG tablet, Take 2 tablets by mouth every day for 5 days, Disp: 10 tablet, Rfl: 0   promethazine-dextromethorphan (PROMETHAZINE-DM) 6.25-15 MG/5ML syrup, Take 5 mLs by mouth 3 (three) times daily as needed for cough., Disp: 118 mL, Rfl: 0   SAXENDA 18 MG/3ML SOPN, ADMINISTER 3 MG UNDER THE SKIN DAILY, Disp: 9 mL, Rfl: 1   Semaglutide-Weight Management (WEGOVY) 0.5 MG/0.5ML SOAJ, 0.5 mL Subcutaneous Once a week 30 day(s), Disp: 2 mL, Rfl: 0   Semaglutide-Weight  Management (WEGOVY) 1 MG/0.5ML SOAJ, 1 mL Subcutaneous Once a week 30 day(s) once a week, Disp: 2 mL, Rfl: 1   Semaglutide-Weight Management (WEGOVY) 2.4 MG/0.75ML SOAJ, Inject 0.75 ml Subcutaneous once a week 30 days, Disp: 3 mL, Rfl: 5   Semaglutide-Weight Management (WEGOVY) 2.4 MG/0.75ML SOAJ, Inject 2.4 mg into the skin once a week., Disp: 9 mL, Rfl: 3   Semaglutide-Weight Management (WEGOVY) 2.4 MG/0.75ML SOAJ, Inject 2.4 mg into the skin once a week., Disp: 9 mL, Rfl: 3   Semaglutide-Weight Management 0.25 MG/0.5ML SOAJ, Inject 0.25 mg (0.40ml) under the skin as directed, Disp: 2 mL, Rfl: 2   terconazole (TERAZOL 7) 0.4 % vaginal cream, Apply topically to external tissues as directed for 5-7 days as needed, Disp: 45 g, Rfl: 0   valsartan-hydrochlorothiazide (DIOVAN-HCT) 160-12.5 MG tablet, Take 1 tablet by mouth every day, Disp: 90 tablet, Rfl: 4   valsartan-hydrochlorothiazide (DIOVAN-HCT) 160-12.5 MG tablet, Take 1 tablet by mouth daily., Disp: 90 tablet, Rfl: 3   valsartan-hydrochlorothiazide (DIOVAN-HCT) 320-12.5 MG tablet, Take 1 tablet by mouth daily., Disp: 90 tablet, Rfl: 3   Vitamin D, Ergocalciferol, 50000 units CAPS, Take 1 capsule by mouth once a week., Disp: 12 capsule, Rfl: 3   Medications ordered in this encounter:  Meds ordered this encounter  Medications   azelastine (ASTELIN) 0.1 % nasal spray    Sig: Place 1 spray into both nostrils 2 (two) times daily. Use in each nostril as directed    Dispense:  30 mL    Refill:  0    Order Specific Question:   Supervising Provider    Answer:   Merrilee Jansky [[id number]]     *If you need refills on other medications prior to your next appointment, please contact your pharmacy*  Follow-Up: Call back or seek an in-person evaluation if the symptoms worsen or if the condition fails to improve as anticipated.  [facility] [phone]  Other Instructions Postnasal Drip Postnasal drip is the feeling of mucus going  down the back of your throat. Mucus is a slimy substance that moistens and cleans your nose and throat, as well as the air pockets in face bones near your forehead and cheeks (sinuses). Small amounts of mucus pass from your nose and sinuses down the back of your throat all the time. This is normal. When you produce too much mucus or the mucus gets too thick, you can feel it. Some common causes of postnasal drip include: Having more mucus because of: A cold or the flu. Allergies. Cold air. Certain medicines. Gastroesophageal reflux. Having more mucus that is thicker because of: A sinus or nasal infection. Dry air. A food allergy. Follow these instructions at home: Relieving discomfort  Gargle with a mixture of salt and water 3-4 times a day or as needed. To  make salt water, completely dissolve -1 tsp (3-6 g) of salt in 1 cup (237 mL) of warm water. If the air in your home is dry, use a humidifier to add moisture to the air. Use a saline spray or a container (neti pot) to flush out the nose (nasal irrigation). These methods can help clear away mucus and keep the nasal passages moist. General instructions Take over-the-counter and prescription medicines only as told by your health care provider. Follow instructions from your health care provider about eating or drinking restrictions. You may need to avoid caffeine. Avoid things that you know you are allergic to (allergens), like dust, mold, pollen, pets, or certain foods. Drink enough fluid to keep your urine pale yellow. Keep all follow-up visits. This is important. Contact a health care provider if: You have a fever. You have a sore throat or difficulty swallowing. You have a headache. You have sinus or ear pain. You have a cough that does not go away. The mucus from your nose becomes thick and is green or yellow in color. You have cold or flu symptoms that last more than 10 days. Summary Postnasal drip is the feeling of mucus going  down the back of your throat. Use nasal irrigation or a nasal spray to help clear away mucus and keep the nasal passages moist. Avoid things that you know you are allergic to (allergens), like dust, mold, pollen, pets, or certain foods. This information is not intended to replace advice given to you by your health care provider. Make sure you discuss any questions you have with your health care provider. Document Revised: 12/06/2020 Document Reviewed: 12/06/2020 Elsevier Patient Education  2024 [employer]    If you have been instructed to have an in-person evaluation today at a local Urgent Care facility, please use the link below. It will take you to a list of all of our available [facility], including address, phone number and hours of operation. Please do not delay care.  [facility]  If you or a family member do not have a primary care provider, use the link below to schedule a visit and establish care. When you choose a [facility] primary care physician or advanced practice provider, you gain a long-term partner in health. Find a Primary Care Provider  Learn more about [facility]'s in-office and virtual care options:  - Get Care Now

## 2022-07-04 NOTE — Progress Notes (Signed)
Virtual Visit Consent   Marie Perez, you are scheduled for a virtual visit with a [facility] provider today. Just as with appointments in the office, your consent must be obtained to participate. Your consent will be active for this visit and any virtual visit you may have with one of our providers in the next 365 days. If you have a MyChart account, a copy of this consent can be sent to you electronically.  As this is a virtual visit, video technology does not allow for your provider to perform a traditional examination. This may limit your provider's ability to fully assess your condition. If your provider identifies any concerns that need to be evaluated in person or the need to arrange testing (such as labs, EKG, etc.), we will make arrangements to do so. Although advances in technology are sophisticated, we cannot ensure that it will always work on either your end or our end. If the connection with a video visit is poor, the visit may have to be switched to a telephone visit. With either a video or telephone visit, we are not always able to ensure that we have a secure connection.  By engaging in this virtual visit, you consent to the provision of healthcare and authorize for your insurance to be billed (if applicable) for the services provided during this visit. Depending on your insurance coverage, you may receive a charge related to this service.  I need to obtain your verbal consent now. Are you willing to proceed with your visit today? Marie Perez has provided verbal consent on 07/04/2022 for a virtual visit (video or telephone). [name], PA-C  Date: 07/04/2022 11:44 AM  Virtual Visit via Video Note   I, [name], connected with  Marie Perez  ([id number], 11-Aug-1975) on 07/04/22 at 11:30 AM EDT by a video-enabled telemedicine application and verified that I am speaking with the correct person using two identifiers.  Location: Patient: Virtual Visit  Location Patient: Home Provider: Virtual Visit Location Provider: Home Office   I discussed the limitations of evaluation and management by telemedicine and the availability of in person appointments. The patient expressed understanding and agreed to proceed.    History of Present Illness: Marie Perez is a 47 y.o. who identifies as a female who was assigned female at birth, and is being seen today for sore throat.  HPI: Sinusitis This is a new problem. The current episode started 1 to 4 weeks ago. The problem has been gradually worsening since onset. There has been no fever. The pain is moderate. Associated symptoms include congestion, coughing (throat clearing mostly but cough worsened this morning), ear pain (right), headaches and a sore throat (from drainage). Pertinent negatives include no chills, diaphoresis, hoarse voice, neck pain, sinus pressure or swollen glands. Treatments tried: daily zyrtec, singulair, and flonase. The treatment provided no relief.     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    Allergies: No Known Allergies Medications:  Current Outpatient Medications:    azelastine (ASTELIN) 0.1 % nasal spray, Place 1 spray into both nostrils 2 (two) times daily. Use in each nostril as directed, Disp: 30 mL, Rfl: 0  atorvastatin (LIPITOR) 20 MG tablet, Take 1 tablet (20 mg total) by mouth daily., Disp: 90 tablet, Rfl: 3   cetirizine (ZYRTEC) 10 MG tablet, Take 1 tablet by mouth every day, Disp: 90 tablet, Rfl: 3   cyclobenzaprine (FLEXERIL) 10 MG tablet, Take 1 tablet (10 mg total) by mouth 3 (three) times daily as needed for muscle spasms., Disp:  30 tablet, Rfl: 1   dexlansoprazole (DEXILANT) 60 MG capsule, Take 1 capsule (60 mg total) by mouth daily., Disp: 30 capsule, Rfl: 5   esomeprazole (NEXIUM) 20 MG capsule, TAKE 1 CAPSULE(20 MG) BY MOUTH DAILY, Disp: 30 capsule, Rfl: 2   fluconazole (DIFLUCAN) 150 MG tablet, Take 1 tablet (150 mg total) by mouth every three (3) days as needed., Disp: 2 tablet, Rfl: 0   fluticasone (FLONASE ALLERGY RELIEF) 50 MCG/ACT nasal spray, Place 1 spray into both nostrils daily as needed., Disp: 48 g, Rfl: 3   fluticasone (FLONASE) 50 MCG/ACT nasal spray, Place 2 sprays into both nostrils daily as needed. For allergies, Disp: , Rfl:    ibuprofen (ADVIL,MOTRIN) 600 MG tablet, Take 1 tablet (600 mg total) by mouth every 6 (six) hours as needed., Disp: 30 tablet, Rfl: 0   influenza vac split quadrivalent PF (FLUARIX) 0.5 ML injection, Inject into the muscle., Disp: 0.5 mL, Rfl: 0   Insulin Pen Needle (NOVOFINE PEN NEEDLE) 32G X 6 MM MISC, Use with saxenda, Disp: 50 each, Rfl: 2   linaclotide (LINZESS) 290 MCG CAPS capsule, Take 1 capsule by mouth on an empty stoamch at least 30 minutes before the first meal of the day, Disp: 90 capsule, Rfl: 3   Liraglutide -Weight Management (SAXENDA) 18 MG/3ML SOPN, Inject 0.6 mg under the skin every day for 1 week,then increase dose by 0.6 mg every week up to 1.8 mg a day, Disp: 3 mL, Rfl: 1   lubiprostone (AMITIZA) 24 MCG capsule, Take 1 capsule by mouth twice daily with a meal, Disp: 60 capsule, Rfl: 4   montelukast (SINGULAIR) 10 MG tablet, 1 tablet Orally Once a day 90 days, Disp: 90 tablet, Rfl: 3   montelukast (SINGULAIR) 10 MG tablet, Take 1 tablet (10 mg total) by mouth daily., Disp: 90 tablet, Rfl: 3   naproxen (NAPROSYN) 500 MG tablet, Take 1 tablet (500 mg total) by mouth 2 (two) times daily with a meal., Disp: 60 tablet, Rfl: 1   omeprazole (PRILOSEC) 40 MG capsule, Take 1 capsule (40 mg total) by mouth every morning 30 minutes before breakfast, Disp: 90 capsule, Rfl:  0   pantoprazole (PROTONIX) 40 MG tablet, Take 1 tablet (40 mg total) by mouth daily., Disp: 90 tablet, Rfl: 1   phentermine (ADIPEX-P) 37.5 MG tablet, Take 1 tablet (37.5 mg total) by mouth daily before breakfast., Disp: 7 tablet, Rfl: 0   predniSONE (DELTASONE) 20 MG tablet, Take 2 tablets by mouth every day for 5 days, Disp: 10 tablet, Rfl: 0   promethazine-dextromethorphan (PROMETHAZINE-DM) 6.25-15 MG/5ML syrup, Take 5 mLs by mouth 3 (three) times daily as needed for cough., Disp: 118 mL, Rfl: 0   SAXENDA 18 MG/3ML SOPN, ADMINISTER 3 MG UNDER THE SKIN DAILY, Disp: 9 mL, Rfl: 1   Semaglutide-Weight Management (WEGOVY) 0.5 MG/0.5ML SOAJ, 0.5 mL Subcutaneous Once a week 30 day(s), Disp: 2 mL, Rfl: 0   Semaglutide-Weight Management (WEGOVY) 1 MG/0.5ML SOAJ, 1 mL Subcutaneous Once a week 30 day(s) once a week, Disp: 2 mL, Rfl: 1   Semaglutide-Weight Management (WEGOVY) 2.4 MG/0.75ML SOAJ, Inject 0.75  ml Subcutaneous once a week 30 days, Disp: 3 mL, Rfl: 5   Semaglutide-Weight Management (WEGOVY) 2.4 MG/0.75ML SOAJ, Inject 2.4 mg into the skin once a week., Disp: 9 mL, Rfl: 3   Semaglutide-Weight Management (WEGOVY) 2.4 MG/0.75ML SOAJ, Inject 2.4 mg into the skin once a week., Disp: 9 mL, Rfl: 3   Semaglutide-Weight Management 0.25 MG/0.5ML SOAJ, Inject 0.25 mg (0.17ml) under the skin as directed, Disp: 2 mL, Rfl: 2   terconazole (TERAZOL 7) 0.4 % vaginal cream, Apply topically to external tissues as directed for 5-7 days as needed, Disp: 45 g, Rfl: 0   valsartan-hydrochlorothiazide (DIOVAN-HCT) 160-12.5 MG tablet, Take 1 tablet by mouth every day, Disp: 90 tablet, Rfl: 4   valsartan-hydrochlorothiazide (DIOVAN-HCT) 160-12.5 MG tablet, Take 1 tablet by mouth daily., Disp: 90 tablet, Rfl: 3   valsartan-hydrochlorothiazide (DIOVAN-HCT) 320-12.5 MG tablet, Take 1 tablet by mouth daily., Disp: 90 tablet, Rfl: 3   Vitamin D, Ergocalciferol, 50000 units CAPS, Take 1 capsule by mouth once a week., Disp: 12  capsule, Rfl: 3  Observations/Objective: Patient is well-developed, well-nourished in no acute distress.  Resting comfortably at home.  Head is normocephalic, atraumatic.  No labored breathing.  Speech is clear and coherent with logical content.  Patient is alert and oriented at baseline.    Assessment and Plan: 1. Seasonal allergies - azelastine (ASTELIN) 0.1 % nasal spray; Place 1 spray into both nostrils 2 (two) times daily. Use in each nostril as directed  Dispense: 30 mL; Refill: 0  2. Post-nasal drainage  - Worsening symptoms that have not responded to OTC medications.  - Will give Azelastine nasal spray - Continue allergy medications.  - Can add Mucinex plain - Steam and humidifier can help - Stay well hydrated and get plenty of rest.  - Seek in person evaluation if no symptom improvement or if symptoms worsen   Follow Up Instructions: I discussed the assessment and treatment plan with the patient. The patient was provided an opportunity to ask questions and all were answered. The patient agreed with the plan and demonstrated an understanding of the instructions.  A copy of instructions were sent to the patient via MyChart unless otherwise noted below.    The patient was advised to call back or seek an in-person evaluation if the symptoms worsen or if the condition fails to improve as anticipated.  Time:  I spent 15 minutes with the patient via telehealth technology discussing the above problems/concerns.    [name], PA-C

## 2022-07-09 ENCOUNTER — Other Ambulatory Visit (HOSPITAL_BASED_OUTPATIENT_CLINIC_OR_DEPARTMENT_OTHER): Payer: Self-pay

## 2022-07-10 ENCOUNTER — Other Ambulatory Visit (HOSPITAL_BASED_OUTPATIENT_CLINIC_OR_DEPARTMENT_OTHER): Payer: Self-pay

## 2022-07-10 ENCOUNTER — Encounter (HOSPITAL_BASED_OUTPATIENT_CLINIC_OR_DEPARTMENT_OTHER): Payer: Self-pay | Admitting: Pharmacist

## 2022-07-14 ENCOUNTER — Other Ambulatory Visit (HOSPITAL_BASED_OUTPATIENT_CLINIC_OR_DEPARTMENT_OTHER): Payer: Self-pay

## 2022-07-16 ENCOUNTER — Other Ambulatory Visit (HOSPITAL_BASED_OUTPATIENT_CLINIC_OR_DEPARTMENT_OTHER): Payer: Self-pay

## 2022-07-17 ENCOUNTER — Other Ambulatory Visit (HOSPITAL_BASED_OUTPATIENT_CLINIC_OR_DEPARTMENT_OTHER): Payer: Self-pay

## 2022-07-18 ENCOUNTER — Other Ambulatory Visit (HOSPITAL_BASED_OUTPATIENT_CLINIC_OR_DEPARTMENT_OTHER): Payer: Self-pay

## 2022-07-19 ENCOUNTER — Telehealth: Payer: Commercial Managed Care - PPO | Admitting: Physician Assistant

## 2022-07-19 ENCOUNTER — Other Ambulatory Visit (HOSPITAL_BASED_OUTPATIENT_CLINIC_OR_DEPARTMENT_OTHER): Payer: Self-pay

## 2022-07-19 DIAGNOSIS — J019 Acute sinusitis, unspecified: Secondary | ICD-10-CM

## 2022-07-19 MED ORDER — AMOXICILLIN-POT CLAVULANATE 875-125 MG PO TABS
1.0000 | ORAL_TABLET | Freq: Two times a day (BID) | ORAL | 0 refills | Status: DC
Start: 2022-07-19 — End: 2022-08-20
  Filled 2022-07-19: qty 20, 10d supply, fill #0

## 2022-07-19 MED ORDER — BENZONATATE 100 MG PO CAPS
100.0000 mg | ORAL_CAPSULE | Freq: Three times a day (TID) | ORAL | 0 refills | Status: DC | PRN
Start: 2022-07-19 — End: 2022-08-20
  Filled 2022-07-19: qty 20, 7d supply, fill #0

## 2022-07-19 NOTE — Progress Notes (Signed)
Virtual Visit Consent   [name], you are scheduled for a virtual visit with a [name] provider today. Just as with appointments in the office, your consent must be obtained to participate. Your consent will be active for this visit and any virtual visit you may have with one of our providers in the next 365 days. If you have a MyChart account, a copy of this consent can be sent to you electronically.  As this is a virtual visit, video technology does not allow for your provider to perform a traditional examination. This may limit your provider's ability to fully assess your condition. If your provider identifies any concerns that need to be evaluated in person or the need to arrange testing (such as labs, EKG, etc.), we will make arrangements to do so. Although advances in technology are sophisticated, we cannot ensure that it will always work on either your end or our end. If the connection with a video visit is poor, the visit may have to be switched to a telephone visit. With either a video or telephone visit, we are not always able to ensure that we have a secure connection.  By engaging in this virtual visit, you consent to the provision of healthcare and authorize for your insurance to be billed (if applicable) for the services provided during this visit. Depending on your insurance coverage, you may receive a charge related to this service.  I need to obtain your verbal consent now. Are you willing to proceed with your visit today? Marie Perez has provided verbal consent on 07/19/2022 for a virtual visit (video or telephone). Tylene Fantasia Ward, PA-C  Date: 07/19/2022 9:34 AM  Virtual Visit via Video Note   I, Tylene Fantasia Ward, connected with  [name]  ([id number], April 24, 1975) on 07/19/22 at  9:30 AM EDT by a video-enabled telemedicine application and verified that I am speaking with the correct person using two identifiers.  Location: Patient: Virtual Visit Location  Patient: Home Provider: Virtual Visit Location Provider: Home   I discussed the limitations of evaluation and management by telemedicine and the availability of in person appointments. The patient expressed understanding and agreed to proceed.    History of Present Illness: Marie Perez is a 47 y.o. who identifies as a female who was assigned female at birth, and is being seen today for continued postnasal drip that started several weeks ago.  Pt reports she did a VV two weeks ago, prescribed nasal spray which she has tried.  She reports sx have worsened and she is now experiencing left sided facial pain pressure, left ear pain.  She has a mild cough when lying down at night.  Denies fever, chills, shortness of breath, wheezing.  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    Allergies: No Known Allergies Medications:  Current Outpatient Medications:    amoxicillin-clavulanate (AUGMENTIN) 875-125 MG tablet, Take 1 tablet by mouth 2 (two) times daily., Disp: 20 tablet, Rfl: 0   benzonatate (TESSALON) 100 MG capsule, Take 1 capsule (100 mg total) by mouth 3 (three) times daily as needed., Disp: 20 capsule, Rfl: 0   atorvastatin (LIPITOR) 20 MG  tablet, Take 1 tablet (20 mg total) by mouth daily., Disp: 90 tablet, Rfl: 3   azelastine (ASTELIN) 0.1 % nasal spray, Place 1 spray into both nostrils 2 (two) times daily. Use in each nostril as directed, Disp: 30 mL, Rfl: 0   cetirizine (ZYRTEC) 10 MG tablet, Take 1 tablet by mouth every day, Disp: 90 tablet, Rfl: 3   cyclobenzaprine (FLEXERIL) 10 MG tablet, Take 1 tablet (10 mg total) by mouth 3 (three) times daily  as needed for muscle spasms., Disp: 30 tablet, Rfl: 1   dexlansoprazole (DEXILANT) 60 MG capsule, Take 1 capsule (60 mg total) by mouth daily., Disp: 30 capsule, Rfl: 5   esomeprazole (NEXIUM) 20 MG capsule, TAKE 1 CAPSULE(20 MG) BY MOUTH DAILY, Disp: 30 capsule, Rfl: 2   fluconazole (DIFLUCAN) 150 MG tablet, Take 1 tablet (150 mg total) by mouth every three (3) days as needed., Disp: 2 tablet, Rfl: 0   fluticasone (FLONASE ALLERGY RELIEF) 50 MCG/ACT nasal spray, Place 1 spray into both nostrils daily as needed., Disp: 48 g, Rfl: 3   fluticasone (FLONASE) 50 MCG/ACT nasal spray, Place 2 sprays into both nostrils daily as needed. For allergies, Disp: , Rfl:    ibuprofen (ADVIL,MOTRIN) 600 MG tablet, Take 1 tablet (600 mg total) by mouth every 6 (six) hours as needed., Disp: 30 tablet, Rfl: 0   influenza vac split quadrivalent PF (FLUARIX) 0.5 ML injection, Inject into the muscle., Disp: 0.5 mL, Rfl: 0   Insulin Pen Needle (NOVOFINE PEN NEEDLE) 32G X 6 MM MISC, Use with saxenda, Disp: 50 each, Rfl: 2   linaclotide (LINZESS) 290 MCG CAPS capsule, Take 1 capsule by mouth on an empty stoamch at least 30 minutes before the first meal of the day, Disp: 90 capsule, Rfl: 3   Liraglutide -Weight Management (SAXENDA) 18 MG/3ML SOPN, Inject 0.6 mg under the skin every day for 1 week,then increase dose by 0.6 mg every week up to 1.8 mg a day, Disp: 3 mL, Rfl: 1   lubiprostone (AMITIZA) 24 MCG capsule, Take 1 capsule by mouth twice daily with a meal, Disp: 60 capsule, Rfl: 4   montelukast (SINGULAIR) 10 MG tablet, 1 tablet Orally Once a day 90 days, Disp: 90 tablet, Rfl: 3   montelukast (SINGULAIR) 10 MG tablet, Take 1 tablet (10 mg total) by mouth daily., Disp: 90 tablet, Rfl: 3   naproxen (NAPROSYN) 500 MG tablet, Take 1 tablet (500 mg total) by mouth 2 (two) times daily with a meal., Disp: 60 tablet, Rfl: 1   omeprazole (PRILOSEC) 40 MG capsule, Take 1 capsule (40 mg total) by mouth every morning 30 minutes  before breakfast, Disp: 90 capsule, Rfl: 0   pantoprazole (PROTONIX) 40 MG tablet, Take 1 tablet (40 mg total) by mouth daily., Disp: 90 tablet, Rfl: 1   phentermine (ADIPEX-P) 37.5 MG tablet, Take 1 tablet (37.5 mg total) by mouth daily before breakfast., Disp: 7 tablet, Rfl: 0   predniSONE (DELTASONE) 20 MG tablet, Take 2 tablets by mouth every day for 5 days, Disp: 10 tablet, Rfl: 0   promethazine-dextromethorphan (PROMETHAZINE-DM) 6.25-15 MG/5ML syrup, Take 5 mLs by mouth 3 (three) times daily as needed for cough., Disp: 118 mL, Rfl: 0   SAXENDA 18 MG/3ML SOPN, ADMINISTER 3 MG UNDER THE SKIN DAILY, Disp: 9 mL, Rfl: 1   Semaglutide-Weight Management (WEGOVY) 0.5 MG/0.5ML SOAJ, 0.5 mL Subcutaneous Once a week 30 day(s), Disp: 2 mL, Rfl: 0   Semaglutide-Weight Management (WEGOVY) 1 MG/0.5ML SOAJ, 1  mL Subcutaneous Once a week 30 day(s) once a week, Disp: 2 mL, Rfl: 1   Semaglutide-Weight Management (WEGOVY) 2.4 MG/0.75ML SOAJ, Inject 0.75 ml Subcutaneous once a week 30 days, Disp: 3 mL, Rfl: 5   Semaglutide-Weight Management (WEGOVY) 2.4 MG/0.75ML SOAJ, Inject 2.4 mg into the skin once a week., Disp: 9 mL, Rfl: 3   Semaglutide-Weight Management (WEGOVY) 2.4 MG/0.75ML SOAJ, Inject 2.4 mg into the skin once a week., Disp: 9 mL, Rfl: 3   Semaglutide-Weight Management 0.25 MG/0.5ML SOAJ, Inject 0.25 mg (0.61ml) under the skin as directed, Disp: 2 mL, Rfl: 2   terconazole (TERAZOL 7) 0.4 % vaginal cream, Apply topically to external tissues as directed for 5-7 days as needed, Disp: 45 g, Rfl: 0   valsartan-hydrochlorothiazide (DIOVAN-HCT) 160-12.5 MG tablet, Take 1 tablet by mouth every day, Disp: 90 tablet, Rfl: 4   valsartan-hydrochlorothiazide (DIOVAN-HCT) 160-12.5 MG tablet, Take 1 tablet by mouth daily., Disp: 90 tablet, Rfl: 3   valsartan-hydrochlorothiazide (DIOVAN-HCT) 320-12.5 MG tablet, Take 1 tablet by mouth daily., Disp: 90 tablet, Rfl: 3   Vitamin D, Ergocalciferol, 50000 units CAPS, Take 1  capsule by mouth once a week., Disp: 12 capsule, Rfl: 3  Observations/Objective: Patient is well-developed, well-nourished in no acute distress.  Resting comfortably at home.  Head is normocephalic, atraumatic.  No labored breathing.  Speech is clear and coherent with logical content.  Patient is alert and oriented at baseline.    Assessment and Plan: 1. Acute non-recurrent sinusitis, unspecified location - amoxicillin-clavulanate (AUGMENTIN) 875-125 MG tablet; Take 1 tablet by mouth 2 (two) times daily.  Dispense: 20 tablet; Refill: 0 - benzonatate (TESSALON) 100 MG capsule; Take 1 capsule (100 mg total) by mouth 3 (three) times daily as needed.  Dispense: 20 capsule; Refill: 0  Supportive care discussed.   Follow Up Instructions: I discussed the assessment and treatment plan with the patient. The patient was provided an opportunity to ask questions and all were answered. The patient agreed with the plan and demonstrated an understanding of the instructions.  A copy of instructions were sent to the patient via MyChart unless otherwise noted below.     The patient was advised to call back or seek an in-person evaluation if the symptoms worsen or if the condition fails to improve as anticipated.  Time:  I spent 8 minutes with the patient via telehealth technology discussing the above problems/concerns.    Tylene Fantasia Ward, PA-C

## 2022-07-19 NOTE — Patient Instructions (Signed)
Marie Perez, thank you for joining Tylene Fantasia Ward, PA-C for today's virtual visit.  While this provider is not your primary care provider (PCP), if your PCP is located in our provider database this encounter information will be shared with them immediately following your visit.   A [facility] MyChart account gives you access to today's visit and all your visits, tests, and labs performed at [facility] " click here if you don't have a [facility] MyChart account or go to [email or web address]  Consent: (Patient) Marie Perez provided verbal consent for this virtual visit at the beginning of the encounter.  Current Medications:  Current Outpatient Medications:    amoxicillin-clavulanate (AUGMENTIN) 875-125 MG tablet, Take 1 tablet by mouth 2 (two) times daily., Disp: 20 tablet, Rfl: 0   benzonatate (TESSALON) 100 MG capsule, Take 1 capsule (100 mg total) by mouth 3 (three) times daily as needed., Disp: 20 capsule, Rfl: 0   atorvastatin (LIPITOR) 20 MG tablet, Take 1 tablet (20 mg total) by mouth daily., Disp: 90 tablet, Rfl: 3   azelastine (ASTELIN) 0.1 % nasal spray, Place 1 spray into both nostrils 2 (two) times daily. Use in each nostril as directed, Disp: 30 mL, Rfl: 0   cetirizine (ZYRTEC) 10 MG tablet, Take 1 tablet by mouth every day, Disp: 90 tablet, Rfl: 3   cyclobenzaprine (FLEXERIL) 10 MG tablet, Take 1 tablet (10 mg total) by mouth 3 (three) times daily as needed for muscle spasms., Disp: 30 tablet, Rfl: 1   dexlansoprazole (DEXILANT) 60 MG capsule, Take 1 capsule (60 mg total) by mouth daily., Disp: 30 capsule, Rfl: 5   esomeprazole (NEXIUM) 20 MG capsule, TAKE 1 CAPSULE(20 MG) BY MOUTH DAILY, Disp: 30 capsule, Rfl: 2   fluconazole (DIFLUCAN) 150 MG tablet, Take 1 tablet (150 mg total) by mouth every three (3) days as needed., Disp: 2 tablet, Rfl: 0   fluticasone (FLONASE ALLERGY RELIEF) 50 MCG/ACT nasal spray, Place 1 spray into both nostrils daily as  needed., Disp: 48 g, Rfl: 3   fluticasone (FLONASE) 50 MCG/ACT nasal spray, Place 2 sprays into both nostrils daily as needed. For allergies, Disp: , Rfl:    ibuprofen (ADVIL,MOTRIN) 600 MG tablet, Take 1 tablet (600 mg total) by mouth every 6 (six) hours as needed., Disp: 30 tablet, Rfl: 0   influenza vac split quadrivalent PF (FLUARIX) 0.5 ML injection, Inject into the muscle., Disp: 0.5 mL, Rfl: 0   Insulin Pen Needle (NOVOFINE PEN NEEDLE) 32G X 6 MM MISC, Use with saxenda, Disp: 50 each, Rfl: 2   linaclotide (LINZESS) 290 MCG CAPS capsule, Take 1 capsule by mouth on an empty stoamch at least 30 minutes before the first meal of the day, Disp: 90 capsule, Rfl: 3   Liraglutide -Weight Management (SAXENDA) 18 MG/3ML SOPN, Inject 0.6 mg under the skin every day for 1 week,then increase dose by 0.6 mg every week up to 1.8 mg a day, Disp: 3 mL, Rfl: 1   lubiprostone (AMITIZA) 24 MCG capsule, Take 1 capsule by mouth twice daily with a meal, Disp: 60 capsule, Rfl: 4   montelukast (SINGULAIR) 10 MG tablet, 1 tablet Orally Once a day 90 days, Disp: 90 tablet, Rfl: 3   montelukast (SINGULAIR) 10 MG tablet, Take 1 tablet (10 mg total) by mouth daily., Disp: 90 tablet, Rfl: 3   naproxen (NAPROSYN) 500 MG tablet, Take 1 tablet (500 mg total) by mouth 2 (two) times daily with a meal., Disp: 60 tablet, Rfl:  1   omeprazole (PRILOSEC) 40 MG capsule, Take 1 capsule (40 mg total) by mouth every morning 30 minutes before breakfast, Disp: 90 capsule, Rfl: 0   pantoprazole (PROTONIX) 40 MG tablet, Take 1 tablet (40 mg total) by mouth daily., Disp: 90 tablet, Rfl: 1   phentermine (ADIPEX-P) 37.5 MG tablet, Take 1 tablet (37.5 mg total) by mouth daily before breakfast., Disp: 7 tablet, Rfl: 0   predniSONE (DELTASONE) 20 MG tablet, Take 2 tablets by mouth every day for 5 days, Disp: 10 tablet, Rfl: 0   promethazine-dextromethorphan (PROMETHAZINE-DM) 6.25-15 MG/5ML syrup, Take 5 mLs by mouth 3 (three) times daily as needed  for cough., Disp: 118 mL, Rfl: 0   SAXENDA 18 MG/3ML SOPN, ADMINISTER 3 MG UNDER THE SKIN DAILY, Disp: 9 mL, Rfl: 1   Semaglutide-Weight Management (WEGOVY) 0.5 MG/0.5ML SOAJ, 0.5 mL Subcutaneous Once a week 30 day(s), Disp: 2 mL, Rfl: 0   Semaglutide-Weight Management (WEGOVY) 1 MG/0.5ML SOAJ, 1 mL Subcutaneous Once a week 30 day(s) once a week, Disp: 2 mL, Rfl: 1   Semaglutide-Weight Management (WEGOVY) 2.4 MG/0.75ML SOAJ, Inject 0.75 ml Subcutaneous once a week 30 days, Disp: 3 mL, Rfl: 5   Semaglutide-Weight Management (WEGOVY) 2.4 MG/0.75ML SOAJ, Inject 2.4 mg into the skin once a week., Disp: 9 mL, Rfl: 3   Semaglutide-Weight Management (WEGOVY) 2.4 MG/0.75ML SOAJ, Inject 2.4 mg into the skin once a week., Disp: 9 mL, Rfl: 3   Semaglutide-Weight Management 0.25 MG/0.5ML SOAJ, Inject 0.25 mg (0.6ml) under the skin as directed, Disp: 2 mL, Rfl: 2   terconazole (TERAZOL 7) 0.4 % vaginal cream, Apply topically to external tissues as directed for 5-7 days as needed, Disp: 45 g, Rfl: 0   valsartan-hydrochlorothiazide (DIOVAN-HCT) 160-12.5 MG tablet, Take 1 tablet by mouth every day, Disp: 90 tablet, Rfl: 4   valsartan-hydrochlorothiazide (DIOVAN-HCT) 160-12.5 MG tablet, Take 1 tablet by mouth daily., Disp: 90 tablet, Rfl: 3   valsartan-hydrochlorothiazide (DIOVAN-HCT) 320-12.5 MG tablet, Take 1 tablet by mouth daily., Disp: 90 tablet, Rfl: 3   Vitamin D, Ergocalciferol, 50000 units CAPS, Take 1 capsule by mouth once a week., Disp: 12 capsule, Rfl: 3   Medications ordered in this encounter:  Meds ordered this encounter  Medications   amoxicillin-clavulanate (AUGMENTIN) 875-125 MG tablet    Sig: Take 1 tablet by mouth 2 (two) times daily.    Dispense:  20 tablet    Refill:  0    Order Specific Question:   Supervising Provider    Answer:   Merrilee Jansky [[id number]]   benzonatate (TESSALON) 100 MG capsule    Sig: Take 1 capsule (100 mg total) by mouth 3 (three) times daily as needed.     Dispense:  20 capsule    Refill:  0    Order Specific Question:   Supervising Provider    Answer:   Merrilee Jansky [id number]     *If you need refills on other medications prior to your next appointment, please contact your pharmacy*  Follow-Up: Call back or seek an in-person evaluation if the symptoms worsen or if the condition fails to improve as anticipated.  [facility] [phone]  Other Instructions Take antibiotic as prescribed.  Can take tessalon as needed for cough.  Recommend Flonase, Mucinex.  Drink  plenty of fluids. If no improvement make an appointment with your PCP.    If you have been instructed to have an in-person evaluation today at a local Urgent Care facility,  please use the link below. It will take you to a list of all of our available [facility], including address, phone number and hours of operation. Please do not delay care.  [facility]  If you or a family member do not have a primary care provider, use the link below to schedule a visit and establish care. When you choose a [facility] primary care physician or advanced practice provider, you gain a long-term partner in health. Find a Primary Care Provider  Learn more about [facility]'s in-office and virtual care options: [place] [facility]

## 2022-07-29 DIAGNOSIS — F1090 Alcohol use, unspecified, uncomplicated: Secondary | ICD-10-CM | POA: Diagnosis not present

## 2022-07-29 DIAGNOSIS — Z713 Dietary counseling and surveillance: Secondary | ICD-10-CM | POA: Diagnosis not present

## 2022-07-29 DIAGNOSIS — F509 Eating disorder, unspecified: Secondary | ICD-10-CM | POA: Diagnosis not present

## 2022-07-29 DIAGNOSIS — E669 Obesity, unspecified: Secondary | ICD-10-CM | POA: Diagnosis not present

## 2022-07-29 DIAGNOSIS — F59 Unspecified behavioral syndromes associated with physiological disturbances and physical factors: Secondary | ICD-10-CM | POA: Diagnosis not present

## 2022-08-06 DIAGNOSIS — F509 Eating disorder, unspecified: Secondary | ICD-10-CM | POA: Diagnosis not present

## 2022-08-06 DIAGNOSIS — F59 Unspecified behavioral syndromes associated with physiological disturbances and physical factors: Secondary | ICD-10-CM | POA: Diagnosis not present

## 2022-08-06 DIAGNOSIS — F1090 Alcohol use, unspecified, uncomplicated: Secondary | ICD-10-CM | POA: Diagnosis not present

## 2022-08-06 DIAGNOSIS — E669 Obesity, unspecified: Secondary | ICD-10-CM | POA: Diagnosis not present

## 2022-08-06 DIAGNOSIS — Z713 Dietary counseling and surveillance: Secondary | ICD-10-CM | POA: Diagnosis not present

## 2022-08-13 DIAGNOSIS — E669 Obesity, unspecified: Secondary | ICD-10-CM | POA: Diagnosis not present

## 2022-08-13 DIAGNOSIS — F509 Eating disorder, unspecified: Secondary | ICD-10-CM | POA: Diagnosis not present

## 2022-08-13 DIAGNOSIS — F59 Unspecified behavioral syndromes associated with physiological disturbances and physical factors: Secondary | ICD-10-CM | POA: Diagnosis not present

## 2022-08-13 DIAGNOSIS — Z713 Dietary counseling and surveillance: Secondary | ICD-10-CM | POA: Diagnosis not present

## 2022-08-13 DIAGNOSIS — F1090 Alcohol use, unspecified, uncomplicated: Secondary | ICD-10-CM | POA: Diagnosis not present

## 2022-08-18 ENCOUNTER — Telehealth: Payer: Commercial Managed Care - PPO

## 2022-08-20 ENCOUNTER — Ambulatory Visit: Payer: Commercial Managed Care - PPO | Admitting: Family Medicine

## 2022-08-20 ENCOUNTER — Encounter: Payer: Self-pay | Admitting: Family Medicine

## 2022-08-20 VITALS — BP 148/98 | HR 94 | Temp 99.0°F | Ht 65.0 in | Wt 267.2 lb

## 2022-08-20 DIAGNOSIS — I10 Essential (primary) hypertension: Secondary | ICD-10-CM | POA: Diagnosis not present

## 2022-08-20 DIAGNOSIS — Z7689 Persons encountering health services in other specified circumstances: Secondary | ICD-10-CM

## 2022-08-20 DIAGNOSIS — R5383 Other fatigue: Secondary | ICD-10-CM

## 2022-08-20 DIAGNOSIS — K5909 Other constipation: Secondary | ICD-10-CM

## 2022-08-20 DIAGNOSIS — R0683 Snoring: Secondary | ICD-10-CM | POA: Diagnosis not present

## 2022-08-20 DIAGNOSIS — K219 Gastro-esophageal reflux disease without esophagitis: Secondary | ICD-10-CM | POA: Diagnosis not present

## 2022-08-20 DIAGNOSIS — Z1211 Encounter for screening for malignant neoplasm of colon: Secondary | ICD-10-CM

## 2022-08-20 NOTE — Progress Notes (Signed)
Established Patient Office Visit   Subjective  Patient ID: Marie Perez, female    DOB: October 18, 1975  Age: 47 y.o. MRN: [id number]  Chief Complaint  Patient presents with   Establish Care    Weight, hypertension Has been having headaches, previous pcp informed her it could be sinus, but has started to happen more often and is starting to bother/worry her. Is having headaches every other day, is taking BC, when at work as they are the only thing to take them away quick. If at home will take aleve.     Patient is a 47 year old female previously seen at [facility] physicians by Mila Palmer, MD who presents to establish care and follow-up on chronic issues.  HTN: Taking valsartan-hydrochlorothiazide 320-12.5 mg daily.  Patient notes white coat HTN, gets nervous at doctor's offices.  Patient states BP at home typically 132/80s.  Every once in a while BP 150/80 at home due to work-related stress.  LE edema: Patient notes increased LE edema after eating salty foods or during the warmer weather in the summer.  Endorses prolonged sitting at work.  GERD: On Prilosec times a while however it feels like it is no longer working.  Patient notes heartburn symptoms with tomato sauces, "thick foods" such as bread or pasta.  Patient has not seen GI.  Had appointment for colonoscopy however canceled due to feeling nervous.  Constipation: History of chronic constipation as a kid.  Was taking MiraLAX which seemed to help.  Started on Linzess.  May have 1-2 BMs per week while on Linzess.  Drinking lots of water daily.  Has not seen GI.  Fatigue: Patient mentions ongoing fatigue.  Previously worked third shift.  Notes going to sleep around 8 PM and waking up at 3 AM unable to fall back asleep.  Patient states her husband has told her that she snores at night.  Denies apneic spells.  Has never had a sleep study.  Headaches: Patient notes history of headaches off and on for the last 3 months.  Denies changes in  stress.  Allergies: NKDA  Social history: Patient is married.  Patient has 1 child.  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  Patient denies alcohol, tobacco, drug use.  Health maintenance: Eye doctor-[facility].  Last eye exam 2024 Dentist-[facility]. Pap-February 2023 Mammogram-2024  Family medical history: Mom-HLD, HTN, cervical cancer Dad-diabetes, MI, HTN    Past Medical History:  Diagnosis Date   Hyperlipidemia    Hypertension    Vitamin D deficiency    Past Surgical History:  Procedure Laterality Date   CESAREAN SECTION  01/2008   Social History   Tobacco Use   Smoking status: Never   Smokeless tobacco: Never  Vaping Use   Vaping status: Never Used  Substance Use Topics   Alcohol use: No   Drug use: No   Family History  Problem Relation Age of Onset   Hypertension Mother    Diabetes Father    Breast cancer Neg Hx    No Known Allergies    ROS Negative unless stated above    Objective:     BP (!) 148/98 (BP Location: Left Arm, Patient Position: Sitting, Cuff Size: Large)   Pulse 94   Temp 99 F (37.2 C) (Oral)   Ht 5\' 5"  (1.651 m)   Wt 267 lb 3.2 oz (121.2 kg)   LMP 08/09/2022   SpO2 99%   BMI 44.46 kg/m  BP Readings from  Last 3 Encounters:  08/20/22 (!) 148/98  07/18/20 130/80  04/30/20 (!) 140/100   Wt Readings from Last 3 Encounters:  08/20/22 267 lb 3.2 oz (121.2 kg)  07/18/20 241 lb 9.6 oz (109.6 kg)  04/30/20 240 lb 3.2 oz (109 kg)      Physical Exam Constitutional:      General: She is not in acute distress.    Appearance: Normal appearance.  HENT:     Head: Normocephalic and atraumatic.     Nose: Nose normal.     Mouth/Throat:     Mouth: Mucous membranes are moist.  Cardiovascular:     Rate and Rhythm: Normal rate and regular rhythm.     Heart sounds: Normal heart sounds. No murmur heard.    No gallop.  Pulmonary:     Effort: Pulmonary effort is normal. No respiratory distress.      Breath sounds: Normal breath sounds. No wheezing, rhonchi or rales.  Skin:    General: Skin is warm and dry.  Neurological:     Mental Status: She is alert and oriented to person, place, and time.      No results found for any visits on 08/20/22.    Assessment & Plan:  Essential hypertension -Elevated.  Initial BP 154/110.  Recheck 148/98 -Continue valsartan-hydrochlorothiazide 320-12.5 mg daily -Discussed the importance of lifestyle modifications -Given BP discussed sleep study.   -     Home sleep test  Chronic constipation -Consider food diary -Continue Linzess -     Ambulatory referral to Gastroenterology  Gastroesophageal reflux disease, unspecified whether esophagitis present -     Ambulatory referral to Gastroenterology  Snoring -     Home sleep test  Fatigue, unspecified type -Likely multifactorial including possible OSA, constipation.  Consider other causes including vitamin or electrolyte deficiencies. -Advised process can be helpful -     Home sleep test  Encounter to establish care -We reviewed the PMH, PSH, FH, SH, Meds and Allergies. -We provided refills for any medications we will prescribe as needed. -We addressed current concerns per orders and patient instructions. -We have asked for records for pertinent exams, studies, vaccines and notes from previous providers. -We have advised patient to follow up per instructions below.  Colon cancer screening -     Ambulatory referral to Gastroenterology   Return in about 3 months (around 11/20/2022), or if symptoms worsen or fail to improve, for blood pressure re-check.   Deeann Saint, MD

## 2022-08-20 NOTE — Patient Instructions (Signed)
An order for a sleep study was placed as well as one to gastroenterology.  You should expect a phone call about setting up these appointments.

## 2022-08-27 DIAGNOSIS — Z713 Dietary counseling and surveillance: Secondary | ICD-10-CM | POA: Diagnosis not present

## 2022-08-27 DIAGNOSIS — F59 Unspecified behavioral syndromes associated with physiological disturbances and physical factors: Secondary | ICD-10-CM | POA: Diagnosis not present

## 2022-08-27 DIAGNOSIS — F509 Eating disorder, unspecified: Secondary | ICD-10-CM | POA: Diagnosis not present

## 2022-08-27 DIAGNOSIS — F1090 Alcohol use, unspecified, uncomplicated: Secondary | ICD-10-CM | POA: Diagnosis not present

## 2022-08-27 DIAGNOSIS — E669 Obesity, unspecified: Secondary | ICD-10-CM | POA: Diagnosis not present

## 2022-08-28 DIAGNOSIS — R5383 Other fatigue: Secondary | ICD-10-CM | POA: Diagnosis not present

## 2022-08-28 DIAGNOSIS — E559 Vitamin D deficiency, unspecified: Secondary | ICD-10-CM | POA: Diagnosis not present

## 2022-08-28 DIAGNOSIS — I10 Essential (primary) hypertension: Secondary | ICD-10-CM | POA: Diagnosis not present

## 2022-08-28 DIAGNOSIS — F509 Eating disorder, unspecified: Secondary | ICD-10-CM | POA: Diagnosis not present

## 2022-08-28 DIAGNOSIS — F5081 Binge eating disorder: Secondary | ICD-10-CM | POA: Diagnosis not present

## 2022-08-28 DIAGNOSIS — K219 Gastro-esophageal reflux disease without esophagitis: Secondary | ICD-10-CM | POA: Diagnosis not present

## 2022-08-28 DIAGNOSIS — F59 Unspecified behavioral syndromes associated with physiological disturbances and physical factors: Secondary | ICD-10-CM | POA: Diagnosis not present

## 2022-08-28 DIAGNOSIS — Z713 Dietary counseling and surveillance: Secondary | ICD-10-CM | POA: Diagnosis not present

## 2022-08-28 DIAGNOSIS — E785 Hyperlipidemia, unspecified: Secondary | ICD-10-CM | POA: Diagnosis not present

## 2022-08-28 DIAGNOSIS — E669 Obesity, unspecified: Secondary | ICD-10-CM | POA: Diagnosis not present

## 2022-09-03 ENCOUNTER — Encounter: Payer: Self-pay | Admitting: Family Medicine

## 2022-09-03 DIAGNOSIS — F1090 Alcohol use, unspecified, uncomplicated: Secondary | ICD-10-CM | POA: Diagnosis not present

## 2022-09-03 DIAGNOSIS — E669 Obesity, unspecified: Secondary | ICD-10-CM | POA: Diagnosis not present

## 2022-09-03 DIAGNOSIS — F509 Eating disorder, unspecified: Secondary | ICD-10-CM | POA: Diagnosis not present

## 2022-09-03 DIAGNOSIS — F59 Unspecified behavioral syndromes associated with physiological disturbances and physical factors: Secondary | ICD-10-CM | POA: Diagnosis not present

## 2022-09-03 DIAGNOSIS — Z713 Dietary counseling and surveillance: Secondary | ICD-10-CM | POA: Diagnosis not present

## 2022-09-08 ENCOUNTER — Other Ambulatory Visit (HOSPITAL_BASED_OUTPATIENT_CLINIC_OR_DEPARTMENT_OTHER): Payer: Self-pay

## 2022-09-08 DIAGNOSIS — Z713 Dietary counseling and surveillance: Secondary | ICD-10-CM | POA: Diagnosis not present

## 2022-09-08 DIAGNOSIS — F59 Unspecified behavioral syndromes associated with physiological disturbances and physical factors: Secondary | ICD-10-CM | POA: Diagnosis not present

## 2022-09-08 DIAGNOSIS — F509 Eating disorder, unspecified: Secondary | ICD-10-CM | POA: Diagnosis not present

## 2022-09-08 DIAGNOSIS — F1090 Alcohol use, unspecified, uncomplicated: Secondary | ICD-10-CM | POA: Diagnosis not present

## 2022-09-08 DIAGNOSIS — E669 Obesity, unspecified: Secondary | ICD-10-CM | POA: Diagnosis not present

## 2022-09-11 ENCOUNTER — Other Ambulatory Visit (HOSPITAL_BASED_OUTPATIENT_CLINIC_OR_DEPARTMENT_OTHER): Payer: Self-pay

## 2022-09-11 MED ORDER — METRONIDAZOLE 500 MG PO TABS
500.0000 mg | ORAL_TABLET | Freq: Two times a day (BID) | ORAL | 1 refills | Status: DC
  Filled 2022-09-11: qty 14, 7d supply, fill #0

## 2022-09-12 NOTE — Telephone Encounter (Signed)
Was able to see lab work from 03/28/2022 which was normal with the exception of vitamin D deficiency.  Patient should be able to upload the more recent labs via MyChart.

## 2022-09-13 ENCOUNTER — Other Ambulatory Visit (HOSPITAL_BASED_OUTPATIENT_CLINIC_OR_DEPARTMENT_OTHER): Payer: Self-pay

## 2022-09-15 LAB — HM PAP SMEAR: HPV, high-risk: NEGATIVE

## 2022-09-17 ENCOUNTER — Other Ambulatory Visit (HOSPITAL_BASED_OUTPATIENT_CLINIC_OR_DEPARTMENT_OTHER): Payer: Self-pay

## 2022-09-18 ENCOUNTER — Other Ambulatory Visit (HOSPITAL_BASED_OUTPATIENT_CLINIC_OR_DEPARTMENT_OTHER): Payer: Self-pay

## 2022-09-19 ENCOUNTER — Other Ambulatory Visit (HOSPITAL_BASED_OUTPATIENT_CLINIC_OR_DEPARTMENT_OTHER): Payer: Self-pay

## 2022-09-19 DIAGNOSIS — Z713 Dietary counseling and surveillance: Secondary | ICD-10-CM | POA: Diagnosis not present

## 2022-09-19 DIAGNOSIS — E669 Obesity, unspecified: Secondary | ICD-10-CM | POA: Diagnosis not present

## 2022-09-19 DIAGNOSIS — F509 Eating disorder, unspecified: Secondary | ICD-10-CM | POA: Diagnosis not present

## 2022-09-19 DIAGNOSIS — F1090 Alcohol use, unspecified, uncomplicated: Secondary | ICD-10-CM | POA: Diagnosis not present

## 2022-09-19 DIAGNOSIS — F59 Unspecified behavioral syndromes associated with physiological disturbances and physical factors: Secondary | ICD-10-CM | POA: Diagnosis not present

## 2022-09-19 MED ORDER — MOUNJARO 2.5 MG/0.5ML ~~LOC~~ SOAJ
2.5000 mg | SUBCUTANEOUS | 1 refills | Status: DC
  Filled 2022-09-19: qty 2, 28d supply, fill #0

## 2022-09-24 DIAGNOSIS — F1090 Alcohol use, unspecified, uncomplicated: Secondary | ICD-10-CM | POA: Diagnosis not present

## 2022-09-24 DIAGNOSIS — Z713 Dietary counseling and surveillance: Secondary | ICD-10-CM | POA: Diagnosis not present

## 2022-09-24 DIAGNOSIS — E669 Obesity, unspecified: Secondary | ICD-10-CM | POA: Diagnosis not present

## 2022-09-24 DIAGNOSIS — F509 Eating disorder, unspecified: Secondary | ICD-10-CM | POA: Diagnosis not present

## 2022-09-24 DIAGNOSIS — F59 Unspecified behavioral syndromes associated with physiological disturbances and physical factors: Secondary | ICD-10-CM | POA: Diagnosis not present

## 2022-09-25 ENCOUNTER — Other Ambulatory Visit (HOSPITAL_BASED_OUTPATIENT_CLINIC_OR_DEPARTMENT_OTHER): Payer: Self-pay

## 2022-09-25 ENCOUNTER — Other Ambulatory Visit (HOSPITAL_BASED_OUTPATIENT_CLINIC_OR_DEPARTMENT_OTHER): Payer: Self-pay | Admitting: Student

## 2022-09-25 IMAGING — MG MM DIGITAL SCREENING BILAT W/ TOMO AND CAD
8 series · 8 of 24 positions shown · non-contrast
Comparison: Previous exam(s).

CLINICAL DATA: Screening.

EXAM:
DIGITAL SCREENING BILATERAL MAMMOGRAM WITH TOMOSYNTHESIS AND CAD
TECHNIQUE: Bilateral screening digital craniocaudal and mediolateral oblique
mammograms were obtained. Bilateral screening digital breast
tomosynthesis was performed. The images were evaluated with
computer-aided detection.

[L CC synth-2D]
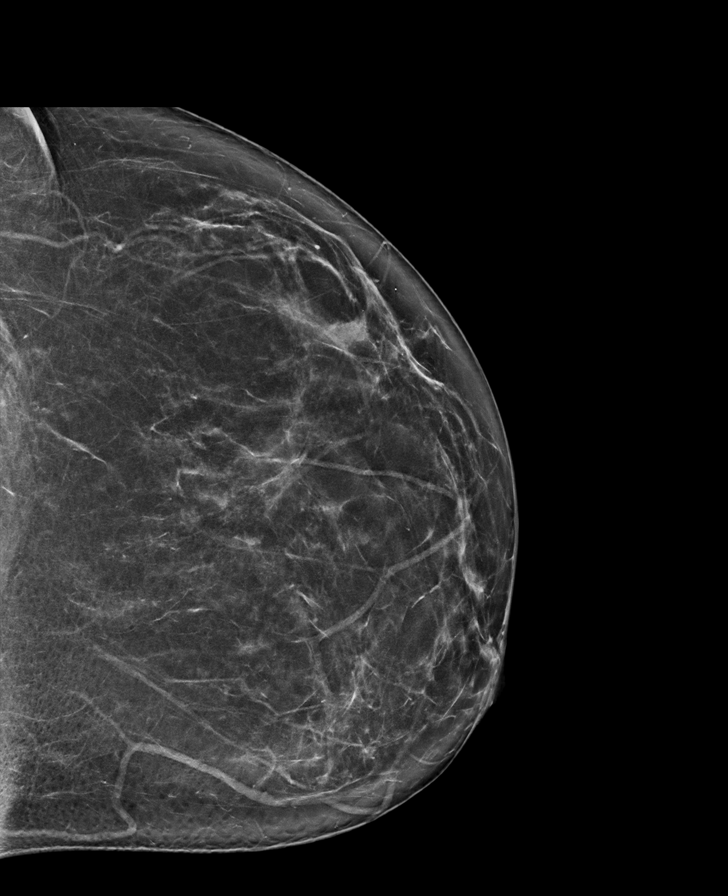

[L MLO synth-2D]
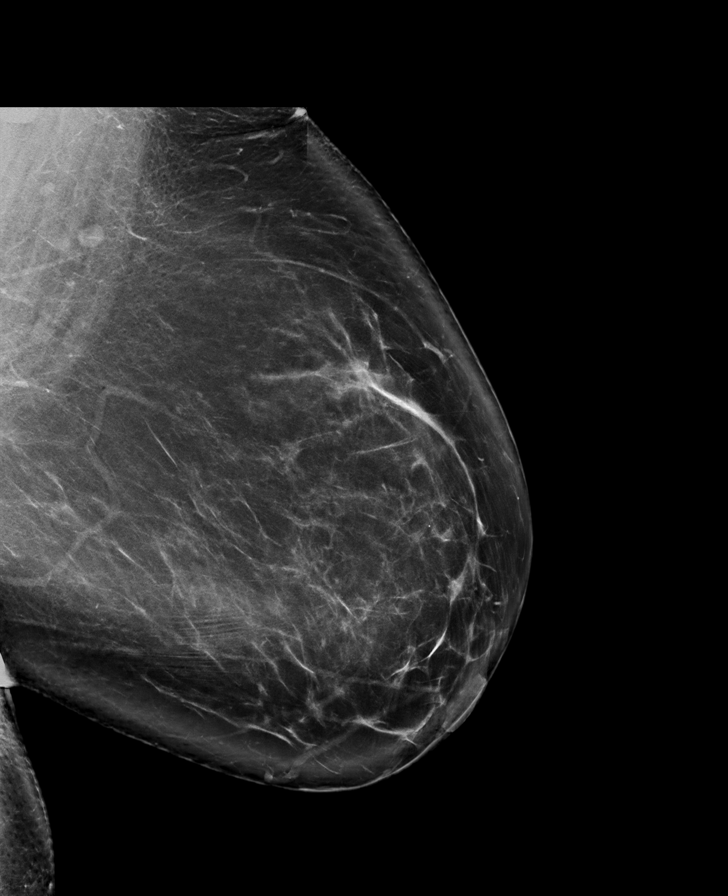

[R CC synth-2D]
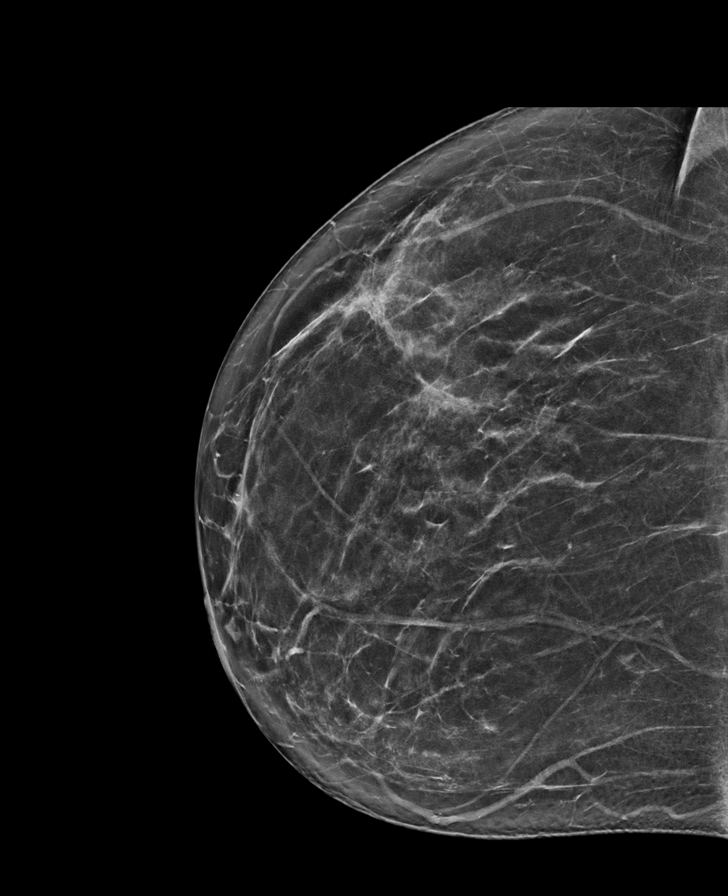

[R MLO synth-2D]
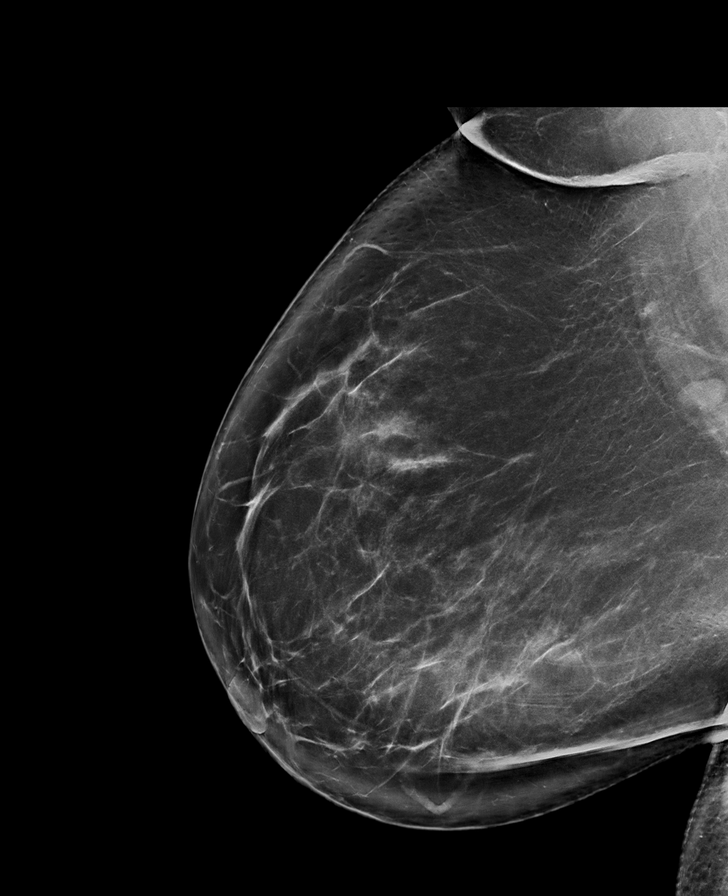

[R MLO tomo · tomo slice 53/105.0]
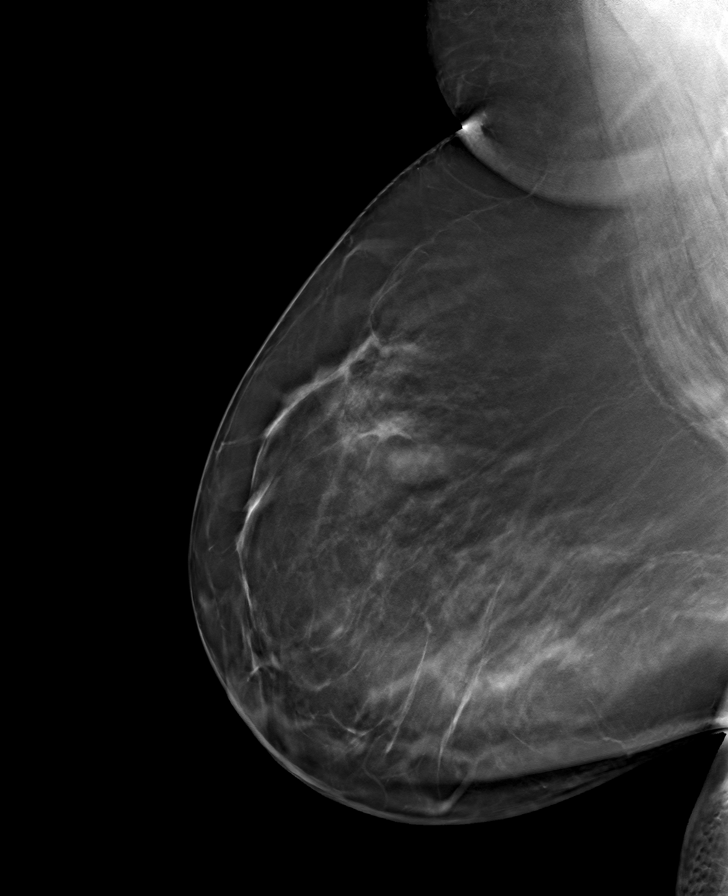

[R CC tomo · tomo slice 41/80.0]
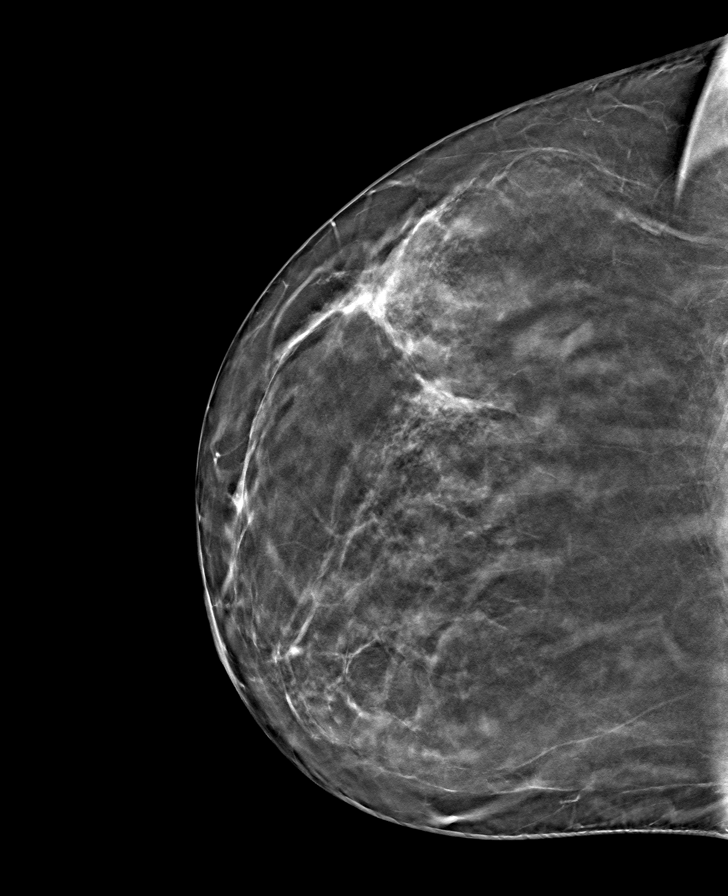

[L MLO tomo · tomo slice 53/105.0]
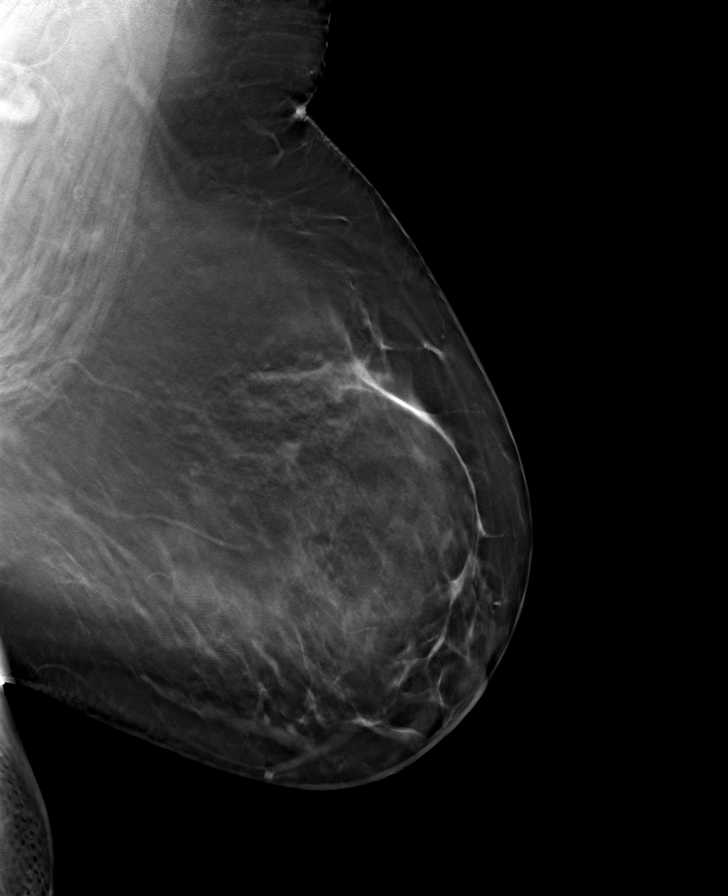

[L CC tomo · tomo slice 39/77.0]
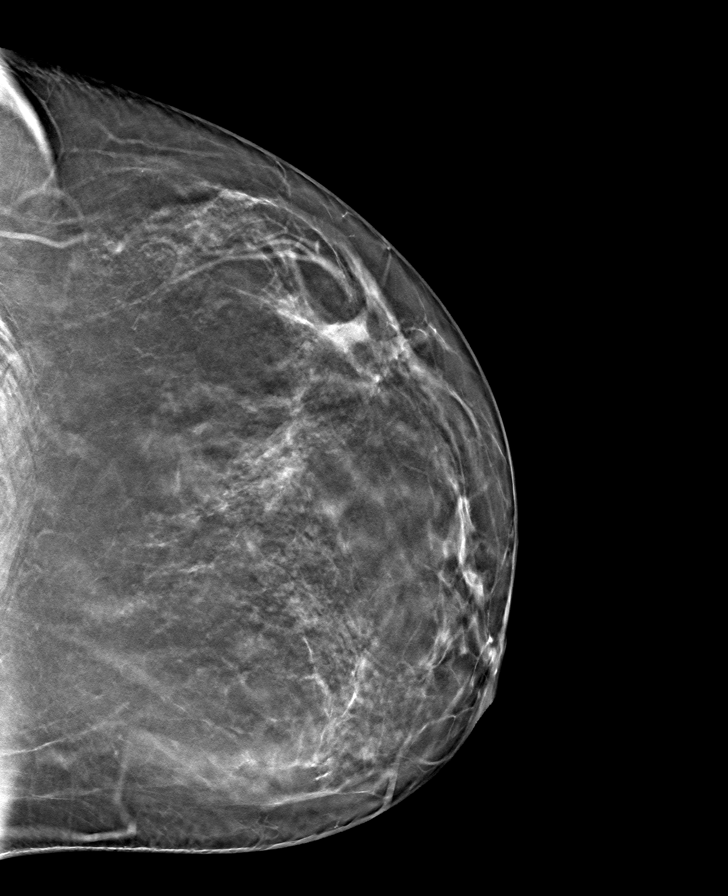

[8 of 24 positions shown; findings below may reference images not displayed]

ACR Breast Density Category b: There are scattered areas of
fibroglandular density.
FINDINGS: There are no findings suspicious for malignancy.
IMPRESSION: No mammographic evidence of malignancy. A result letter of this
screening mammogram will be mailed directly to the patient.

RECOMMENDATION:
Screening mammogram in one year. (Code:51-O-LD2)

BI-RADS CATEGORY  1: Negative.

## 2022-09-25 MED ORDER — METHYLPREDNISOLONE 4 MG PO TBPK
ORAL_TABLET | ORAL | 0 refills | Status: DC
  Filled 2022-09-25: qty 21, 6d supply, fill #0

## 2022-09-25 NOTE — Telephone Encounter (Signed)
Your hgb A1C was 6.1% which is in the prediabetic range.  6.5% and greater is diabetes, while 5.7% and below is normal.  Work to decrease the amount of sweets and carbohydrates you are eating as well as increasing physical activity to lose weight.  We will continue to monitor this.

## 2022-09-27 ENCOUNTER — Emergency Department (HOSPITAL_BASED_OUTPATIENT_CLINIC_OR_DEPARTMENT_OTHER)
Admission: EM | Admit: 2022-09-27 | Discharge: 2022-09-27 | Disposition: A | Discharge status: Home / Self Care | Payer: Commercial Managed Care - PPO | Attending: Emergency Medicine | Admitting: Emergency Medicine

## 2022-09-27 ENCOUNTER — Encounter (HOSPITAL_BASED_OUTPATIENT_CLINIC_OR_DEPARTMENT_OTHER): Payer: Self-pay

## 2022-09-27 ENCOUNTER — Other Ambulatory Visit (HOSPITAL_BASED_OUTPATIENT_CLINIC_OR_DEPARTMENT_OTHER): Payer: Self-pay

## 2022-09-27 ENCOUNTER — Other Ambulatory Visit: Payer: Self-pay

## 2022-09-27 DIAGNOSIS — M546 Pain in thoracic spine: Secondary | ICD-10-CM | POA: Diagnosis present

## 2022-09-27 DIAGNOSIS — M62830 Muscle spasm of back: Secondary | ICD-10-CM | POA: Diagnosis not present

## 2022-09-27 DIAGNOSIS — M62838 Other muscle spasm: Secondary | ICD-10-CM | POA: Diagnosis not present

## 2022-09-27 MED ORDER — NAPROXEN 500 MG PO TABS
500.0000 mg | ORAL_TABLET | Freq: Two times a day (BID) | ORAL | 0 refills | Status: DC
  Filled 2022-09-27: qty 30, 15d supply, fill #0

## 2022-09-27 MED ORDER — KETOROLAC TROMETHAMINE 30 MG/ML IJ SOLN
30.0000 mg | Freq: Once | INTRAMUSCULAR | Status: AC
  Administered 2022-09-27: 30 mg via INTRAMUSCULAR
  Filled 2022-09-27: qty 1

## 2022-09-27 MED ORDER — CYCLOBENZAPRINE HCL 10 MG PO TABS
10.0000 mg | ORAL_TABLET | Freq: Two times a day (BID) | ORAL | 0 refills | Status: DC | PRN
  Filled 2022-09-27: qty 20, 10d supply, fill #0

## 2022-09-27 NOTE — ED Triage Notes (Signed)
POV from home, A&O x 4, GCS 15, amb to triage  Pt sts 4 days ago she began having neck and upper back pain that now is radiating down to mid back. Given steroid dosepak with some improvement.

## 2022-09-27 NOTE — ED Provider Notes (Signed)
[place] EMERGENCY DEPARTMENT AT [facility]  Provider Note  CSN: [id number] Arrival date & time: 09/27/22 6010  History Chief Complaint  Patient presents with   Back Pain    Marie Perez is a 47 y.o. female presents for 4 days of L upper back pain, started in shoulder and has since moved to thoracic back. Worse with movement. She works at [employer] and talked to them about it 2 days ago, they called her in Rx for Medrol which has helped some but she woke up around 3am in pain and decided to come to the ED for evaluation. No falls or injuries. No radicular pain. No CP, cough or fever.    Home Medications Prior to Admission medications   Medication Sig Start Date End Date Taking? Authorizing Provider  cyclobenzaprine (FLEXERIL) 10 MG tablet Take 1 tablet (10 mg total) by mouth 2 (two) times daily as needed for muscle spasms. 09/27/22  Yes Pollyann Savoy, MD  methylPREDNISolone (MEDROL DOSEPAK) 4 MG TBPK tablet Take per packet instructions 09/25/22   Amador Cunas, PA-C  naproxen (NAPROSYN) 500 MG tablet Take 1 tablet (500 mg total) by mouth 2 (two) times daily. 09/27/22  Yes Pollyann Savoy, MD  atorvastatin (LIPITOR) 20 MG tablet Take 1 tablet (20 mg total) by mouth daily. 04/10/22     azelastine (ASTELIN) 0.1 % nasal spray Place 1 spray into both nostrils 2 (two) times daily. Use in each nostril as directed 07/04/22   [name], PA-C  cetirizine (ZYRTEC) 10 MG tablet Take 1 tablet by mouth every day 10/17/20     dexlansoprazole (DEXILANT) 60 MG capsule Take 1 capsule (60 mg total) by mouth daily. 03/31/22     esomeprazole (NEXIUM) 20 MG capsule TAKE 1 CAPSULE(20 MG) BY MOUTH DAILY 05/21/20   Arnette Felts, FNP  fluticasone [facility] ALLERGY RELIEF) 50 MCG/ACT nasal spray Place 1 spray into both nostrils daily as needed. 06/04/22     fluticasone (FLONASE) 50 MCG/ACT nasal spray Place 2 sprays into both nostrils daily as needed. For allergies 05/01/10 04/30/20   Sandford Craze, NP  linaclotide [facility]) 290 MCG CAPS capsule Take 1 capsule by mouth on an empty stoamch at least 30 minutes before the first meal of the day 04/10/22     metroNIDAZOLE (FLAGYL) 500 MG tablet Take 1 tablet (500 mg total) by mouth 2 (two) times daily. 09/11/22     montelukast (SINGULAIR) 10 MG tablet Take 1 tablet (10 mg total) by mouth daily. 06/18/22     omeprazole (PRILOSEC) 40 MG capsule Take 1 capsule (40 mg total) by mouth every morning 30 minutes before breakfast 06/18/22     pantoprazole (PROTONIX) 40 MG tablet Take 1 tablet (40 mg total) by mouth daily. 04/10/22     promethazine-dextromethorphan (PROMETHAZINE-DM) 6.25-15 MG/5ML syrup Take 5 mLs by mouth 3 (three) times daily as needed for cough. 11/21/21   Freddy Finner, NP  terconazole (TERAZOL 7) 0.4 % vaginal cream Apply topically to external tissues as directed for 5-7 days as needed 10/18/21   [name], PA-C  tirzepatide [facility]) 2.5 MG/0.5ML Pen Inject 2.5 mg into the skin once a week. 09/17/22     valsartan-hydrochlorothiazide (DIOVAN-HCT) 320-12.5 MG tablet Take 1 tablet by mouth daily. 06/04/22     Vitamin D, Ergocalciferol, 50000 units CAPS Take 1 capsule by mouth once a week. 03/31/22     Phentermine-Topiramate (QSYMIA) 3.75-23 MG CP24 Take 1 tablet by mouth daily for two weeks,  then take 2 tablets by mouth daily 05/03/22 05/03/22       Allergies    Patient has no known allergies.   Review of Systems   Review of Systems Please see HPI for pertinent positives and negatives  Physical Exam BP (!) 181/95   Pulse 98   Temp 98.5 F (36.9 C)   Resp 18   Ht 5\' 5"  (1.651 m)   Wt 121.1 kg   LMP 09/04/2022 (Exact Date)   SpO2 100%   BMI 44.43 kg/m   Physical Exam Vitals and nursing note reviewed.  Constitutional:      Appearance: Normal appearance.  HENT:     Head: Normocephalic and atraumatic.     Nose: Nose normal.     Mouth/Throat:     Mouth: Mucous membranes are moist.  Eyes:      Extraocular Movements: Extraocular movements intact.     Conjunctiva/sclera: Conjunctivae normal.  Cardiovascular:     Rate and Rhythm: Normal rate.  Pulmonary:     Effort: Pulmonary effort is normal.     Breath sounds: Normal breath sounds.  Abdominal:     General: Abdomen is flat.     Palpations: Abdomen is soft.     Tenderness: There is no abdominal tenderness.  Musculoskeletal:        General: Tenderness (L cervical and thoracic paraspinal muscles) present. No swelling. Normal range of motion.     Cervical back: Neck supple.  Skin:    General: Skin is warm and dry.  Neurological:     General: No focal deficit present.     Mental Status: She is alert.  Psychiatric:        Mood and Affect: Mood normal.     ED Results / Procedures / Treatments   EKG None  Procedures Procedures  Medications Ordered in the ED Medications  ketorolac (TORADOL) 30 MG/ML injection 30 mg (has no administration in time range)    Initial Impression and Plan  Patient here with pain most consistent with muscle spasm. No red flags or concerns for acute surgical process. Will give a dose of Toradol for pain here, Rx for Naprosyn and Flexeril. Recommend she complete her steroid course as it seems to be helping. Ortho follow up if not improving. RTED for any other concerns.   ED Course       MDM Rules/Calculators/A&P Medical Decision Making Problems Addressed: Spasm of left trapezius muscle: acute illness or injury  Risk Prescription drug management.     Final Clinical Impression(s) / ED Diagnoses Final diagnoses:  Spasm of left trapezius muscle    Rx / DC Orders ED Discharge Orders          Ordered    naproxen (NAPROSYN) 500 MG tablet  2 times daily        09/27/22 0515    cyclobenzaprine (FLEXERIL) 10 MG tablet  2 times daily PRN        09/27/22 0515             Pollyann Savoy, MD 09/27/22 [phone]

## 2022-09-30 ENCOUNTER — Other Ambulatory Visit (HOSPITAL_BASED_OUTPATIENT_CLINIC_OR_DEPARTMENT_OTHER): Payer: Self-pay

## 2022-10-01 ENCOUNTER — Other Ambulatory Visit (HOSPITAL_BASED_OUTPATIENT_CLINIC_OR_DEPARTMENT_OTHER): Payer: Self-pay

## 2022-10-01 DIAGNOSIS — F1090 Alcohol use, unspecified, uncomplicated: Secondary | ICD-10-CM | POA: Diagnosis not present

## 2022-10-01 DIAGNOSIS — F509 Eating disorder, unspecified: Secondary | ICD-10-CM | POA: Diagnosis not present

## 2022-10-01 DIAGNOSIS — Z713 Dietary counseling and surveillance: Secondary | ICD-10-CM | POA: Diagnosis not present

## 2022-10-01 DIAGNOSIS — E669 Obesity, unspecified: Secondary | ICD-10-CM | POA: Diagnosis not present

## 2022-10-01 DIAGNOSIS — F59 Unspecified behavioral syndromes associated with physiological disturbances and physical factors: Secondary | ICD-10-CM | POA: Diagnosis not present

## 2022-10-01 MED ORDER — MOUNJARO 2.5 MG/0.5ML ~~LOC~~ SOAJ
2.5000 mg | SUBCUTANEOUS | 1 refills | Status: DC
  Filled 2022-10-01 – 2022-10-15 (×2): qty 2, 28d supply, fill #0
  Filled 2022-12-11: qty 2, 28d supply, fill #1

## 2022-10-07 ENCOUNTER — Ambulatory Visit (INDEPENDENT_AMBULATORY_CARE_PROVIDER_SITE_OTHER): Payer: Commercial Managed Care - PPO | Admitting: Internal Medicine

## 2022-10-07 ENCOUNTER — Other Ambulatory Visit (HOSPITAL_BASED_OUTPATIENT_CLINIC_OR_DEPARTMENT_OTHER): Payer: Self-pay

## 2022-10-07 ENCOUNTER — Encounter: Payer: Self-pay | Admitting: Internal Medicine

## 2022-10-07 VITALS — BP 120/68 | HR 100 | Ht 65.0 in | Wt 264.0 lb

## 2022-10-07 DIAGNOSIS — Z1211 Encounter for screening for malignant neoplasm of colon: Secondary | ICD-10-CM

## 2022-10-07 DIAGNOSIS — R1310 Dysphagia, unspecified: Secondary | ICD-10-CM

## 2022-10-07 DIAGNOSIS — K219 Gastro-esophageal reflux disease without esophagitis: Secondary | ICD-10-CM | POA: Diagnosis not present

## 2022-10-07 MED ORDER — PLENVU 140 G PO SOLR
1.0000 | Freq: Once | ORAL | 0 refills | Status: AC
  Filled 2022-10-07: qty 3, 1d supply, fill #0

## 2022-10-07 NOTE — Progress Notes (Signed)
HISTORY OF PRESENT ILLNESS:  Marie Perez is a 47 y.o. female, married mother of 1 daughter and patient access coordinator for [facility], who is sent today by her primary care provider regarding chronic reflux, dysphagia, and the need for screening colonoscopy.  This patient was seen in this office by the GI nurse practitioner April 10, 2020 regarding chronic constipation, chronic GERD, and the need for colon cancer screening.  See that dictation for details.  She was scheduled for colonoscopy, but canceled due to anxiety over the procedure.  First, the patient reports problems with chronic reflux dating back to the birth of her daughter, who is now 4 years old.  She has been on a number of PPIs.  Currently on Nexium 40 mg daily.  Recently saw her PCP regarding breakthrough reflux symptoms for which twice daily Nexium was recommended.  She tried this for a few weeks.  She thinks it may have helped some.  She has resumed once daily therapy.  She reports at least a 1 year history of intermittent solid food dysphagia.  No weight loss.  Her BMI is approximately 44.  She has not had prior upper endoscopy.  She has chronic constipation.  Currently taking Linzess 290 mcg daily.  She feels this helps.  For her obesity she recently started [facility] 2 weeks ago.  No worsening of constipation.  As a matter fact, she states this has helped her bowels.  No family history of colon cancer.  No bleeding.  GI review of systems is otherwise negative.  Reviewed blood work from March 2024 shows a normal hemoglobin of 12.8.  Normal liver tests.  REVIEW OF SYSTEMS:  All non-GI ROS negative unless otherwise stated in the HPI except for headaches  Past Medical History:  Diagnosis Date   Hyperlipidemia    Hypertension    Vitamin D deficiency     Past Surgical History:  Procedure Laterality Date   CESAREAN SECTION  01/2008    Social History Marie Perez  reports that she has never smoked. She has never  used smokeless tobacco. She reports that she does not drink alcohol and does not use drugs.  family history includes Diabetes in her father; High Cholesterol in her mother; Hypertension in her mother.  No Known Allergies     PHYSICAL EXAMINATION: Vital signs: BP 120/68   Pulse 100   Ht 5\' 5"  (1.651 m)   Wt 264 lb (119.7 kg)   LMP 09/04/2022 (Exact Date)   BMI 43.93 kg/m   Constitutional: Pleasant, generally well-appearing, no acute distress Psychiatric: alert and oriented x3, cooperative Eyes: extraocular movements intact, anicteric, conjunctiva pink Mouth: oral pharynx moist, no lesions Neck: supple no lymphadenopathy Cardiovascular: heart regular rate and rhythm, no murmur Lungs: clear to auscultation bilaterally Abdomen: soft, obese, nontender, nondistended, no obvious ascites, no peritoneal signs, normal bowel sounds, no organomegaly Rectal: Deferred until colonoscopy Extremities: no clubbing, cyanosis, or lower extremity edema bilaterally Skin: no lesions on visible extremities Neuro: No focal deficits.  Cranial nerves intact  ASSESSMENT:  1.  Chronic GERD.  Occasional breakthrough symptoms with once daily PPI. 2.  Intermittent solid food dysphagia.  Rule out peptic stricture 3.  Morbid obesity.  This clearly exacerbates reflux.  We discussed this.  Recently started [facility] 4.  Colon cancer screening.  Average risk for colorectal cancer.   PLAN:  1.  Reflux precautions with attention to weight loss 2.  Continue Nexium 40 mg daily. 3.  Schedule upper endoscopy with possible  esophageal dilation.  The patient is high risk given her morbid obesity.The nature of the procedure, as well as the risks, benefits, and alternatives were carefully and thoroughly reviewed with the patient. Ample time for discussion and questions allowed. The patient understood, was satisfied, and agreed to proceed. 4.  Schedule colonoscopy for colon cancer screening.  The patient is high risk as  above.The nature of the procedure, as well as the risks, benefits, and alternatives were carefully and thoroughly reviewed with the patient. Ample time for discussion and questions allowed. The patient understood, was satisfied, and agreed to proceed. 5.  Hold Mounjaro for at least 1 week prior to the procedures.  She understands and agrees. A total time of 40 minutes was spent preparing to see the patient, obtaining comprehensive history, performing medically appropriate physical examination, counseling and educating the patient regarding the above listed issues, ordering multiple endoscopic procedures including therapeutic, and documenting clinical information in the health record

## 2022-10-07 NOTE — Patient Instructions (Signed)
You have been scheduled for an endoscopy and colonoscopy. Please follow the written instructions given to you at your visit today.  Please pick up your prep supplies at the pharmacy within the next 1-3 days.  If you use inhalers (even only as needed), please bring them with you on the day of your procedure.  DO NOT TAKE 7 DAYS PRIOR TO TEST- Trulicity (dulaglutide) Ozempic, Wegovy (semaglutide) Mounjaro (tirzepatide) Bydureon Bcise (exanatide extended release)  DO NOT TAKE 1 DAY PRIOR TO YOUR TEST Rybelsus (semaglutide) Adlyxin (lixisenatide) Victoza (liraglutide) Byetta (exanatide) ___________________________________________________________________________ _______________________________________________________  If your blood pressure at your visit was 140/90 or greater, please contact your primary care physician to follow up on this.  _______________________________________________________  If you are age 72 or older, your body mass index should be between 23-30. Your Body mass index is 43.93 kg/m. If this is out of the aforementioned range listed, please consider follow up with your Primary Care Provider.  If you are age 55 or younger, your body mass index should be between 19-25. Your Body mass index is 43.93 kg/m. If this is out of the aformentioned range listed, please consider follow up with your Primary Care Provider.   ________________________________________________________  The [facility] providers would like to encourage you to use [facility] to communicate with providers for non-urgent requests or questions.  Due to long hold times on the telephone, sending your provider a message by [facility] may be a faster and more efficient way to get a response.  Please allow 48 business hours for a response.  Please remember that this is for non-urgent requests.  _______________________________________________________

## 2022-10-09 DIAGNOSIS — F509 Eating disorder, unspecified: Secondary | ICD-10-CM | POA: Diagnosis not present

## 2022-10-09 DIAGNOSIS — F1090 Alcohol use, unspecified, uncomplicated: Secondary | ICD-10-CM | POA: Diagnosis not present

## 2022-10-09 DIAGNOSIS — E669 Obesity, unspecified: Secondary | ICD-10-CM | POA: Diagnosis not present

## 2022-10-09 DIAGNOSIS — Z713 Dietary counseling and surveillance: Secondary | ICD-10-CM | POA: Diagnosis not present

## 2022-10-09 DIAGNOSIS — F59 Unspecified behavioral syndromes associated with physiological disturbances and physical factors: Secondary | ICD-10-CM | POA: Diagnosis not present

## 2022-10-12 ENCOUNTER — Other Ambulatory Visit (HOSPITAL_BASED_OUTPATIENT_CLINIC_OR_DEPARTMENT_OTHER): Payer: Self-pay

## 2022-10-14 ENCOUNTER — Other Ambulatory Visit (HOSPITAL_BASED_OUTPATIENT_CLINIC_OR_DEPARTMENT_OTHER): Payer: Self-pay

## 2022-10-15 ENCOUNTER — Other Ambulatory Visit (HOSPITAL_BASED_OUTPATIENT_CLINIC_OR_DEPARTMENT_OTHER): Payer: Self-pay

## 2022-10-16 DIAGNOSIS — F59 Unspecified behavioral syndromes associated with physiological disturbances and physical factors: Secondary | ICD-10-CM | POA: Diagnosis not present

## 2022-10-16 DIAGNOSIS — F1090 Alcohol use, unspecified, uncomplicated: Secondary | ICD-10-CM | POA: Diagnosis not present

## 2022-10-16 DIAGNOSIS — E669 Obesity, unspecified: Secondary | ICD-10-CM | POA: Diagnosis not present

## 2022-10-16 DIAGNOSIS — Z713 Dietary counseling and surveillance: Secondary | ICD-10-CM | POA: Diagnosis not present

## 2022-10-16 DIAGNOSIS — F509 Eating disorder, unspecified: Secondary | ICD-10-CM | POA: Diagnosis not present

## 2022-10-28 ENCOUNTER — Other Ambulatory Visit (HOSPITAL_BASED_OUTPATIENT_CLINIC_OR_DEPARTMENT_OTHER): Payer: Self-pay

## 2022-10-28 MED ORDER — FLULAVAL 0.5 ML IM SUSY
0.5000 mL | PREFILLED_SYRINGE | Freq: Once | INTRAMUSCULAR | 0 refills | Status: AC
  Filled 2022-10-28: qty 0.5, 1d supply, fill #0

## 2022-10-30 ENCOUNTER — Other Ambulatory Visit (HOSPITAL_BASED_OUTPATIENT_CLINIC_OR_DEPARTMENT_OTHER): Payer: Self-pay

## 2022-10-30 DIAGNOSIS — F59 Unspecified behavioral syndromes associated with physiological disturbances and physical factors: Secondary | ICD-10-CM | POA: Diagnosis not present

## 2022-10-30 DIAGNOSIS — E669 Obesity, unspecified: Secondary | ICD-10-CM | POA: Diagnosis not present

## 2022-10-30 DIAGNOSIS — F1090 Alcohol use, unspecified, uncomplicated: Secondary | ICD-10-CM | POA: Diagnosis not present

## 2022-10-30 DIAGNOSIS — F509 Eating disorder, unspecified: Secondary | ICD-10-CM | POA: Diagnosis not present

## 2022-10-30 DIAGNOSIS — Z713 Dietary counseling and surveillance: Secondary | ICD-10-CM | POA: Diagnosis not present

## 2022-10-30 MED ORDER — MOUNJARO 5 MG/0.5ML ~~LOC~~ SOAJ
5.0000 mg | SUBCUTANEOUS | 1 refills | Status: DC
  Filled 2022-10-30 – 2022-11-13 (×2): qty 2, 28d supply, fill #0
  Filled 2022-12-20: qty 2, 28d supply, fill #1

## 2022-11-03 ENCOUNTER — Encounter: Payer: Self-pay | Admitting: Internal Medicine

## 2022-11-04 ENCOUNTER — Other Ambulatory Visit (HOSPITAL_BASED_OUTPATIENT_CLINIC_OR_DEPARTMENT_OTHER): Payer: Self-pay | Admitting: Family Medicine

## 2022-11-04 DIAGNOSIS — Z1231 Encounter for screening mammogram for malignant neoplasm of breast: Secondary | ICD-10-CM

## 2022-11-06 ENCOUNTER — Ambulatory Visit (HOSPITAL_BASED_OUTPATIENT_CLINIC_OR_DEPARTMENT_OTHER): Payer: Commercial Managed Care - PPO | Admitting: Radiology

## 2022-11-06 DIAGNOSIS — F509 Eating disorder, unspecified: Secondary | ICD-10-CM | POA: Diagnosis not present

## 2022-11-06 DIAGNOSIS — F1090 Alcohol use, unspecified, uncomplicated: Secondary | ICD-10-CM | POA: Diagnosis not present

## 2022-11-06 DIAGNOSIS — F59 Unspecified behavioral syndromes associated with physiological disturbances and physical factors: Secondary | ICD-10-CM | POA: Diagnosis not present

## 2022-11-06 DIAGNOSIS — E669 Obesity, unspecified: Secondary | ICD-10-CM | POA: Diagnosis not present

## 2022-11-06 DIAGNOSIS — Z713 Dietary counseling and surveillance: Secondary | ICD-10-CM | POA: Diagnosis not present

## 2022-11-08 ENCOUNTER — Telehealth: Payer: Commercial Managed Care - PPO | Admitting: Family Medicine

## 2022-11-08 ENCOUNTER — Other Ambulatory Visit (HOSPITAL_BASED_OUTPATIENT_CLINIC_OR_DEPARTMENT_OTHER): Payer: Self-pay

## 2022-11-08 DIAGNOSIS — J019 Acute sinusitis, unspecified: Secondary | ICD-10-CM | POA: Diagnosis not present

## 2022-11-08 MED ORDER — PREDNISONE 10 MG PO TABS
ORAL_TABLET | ORAL | 0 refills | Status: DC
Start: 2022-11-08 — End: 2022-11-17
  Filled 2022-11-08: qty 21, 6d supply, fill #0

## 2022-11-08 MED ORDER — FLUCONAZOLE 150 MG PO TABS
150.0000 mg | ORAL_TABLET | Freq: Once | ORAL | 0 refills | Status: AC
Start: 2022-11-08 — End: 2022-11-09
  Filled 2022-11-08: qty 1, 1d supply, fill #0

## 2022-11-08 MED ORDER — GUAIFENESIN 200 MG PO TABS
200.0000 mg | ORAL_TABLET | ORAL | 0 refills | Status: DC | PRN
Start: 2022-11-08 — End: 2022-11-17
  Filled 2022-11-08: qty 100, 17d supply, fill #0

## 2022-11-08 MED ORDER — FLUTICASONE PROPIONATE 50 MCG/ACT NA SUSP
2.0000 | Freq: Every day | NASAL | 0 refills | Status: DC
Start: 2022-11-08 — End: 2023-01-23
  Filled 2022-11-08 – 2022-12-11 (×2): qty 16, 30d supply, fill #0

## 2022-11-08 NOTE — Progress Notes (Signed)
Virtual Visit Consent   Marie Perez, you are scheduled for a virtual visit with a [facility] provider today. Just as with appointments in the office, your consent must be obtained to participate. Your consent will be active for this visit and any virtual visit you may have with one of our providers in the next 365 days. If you have a MyChart account, a copy of this consent can be sent to you electronically.  As this is a virtual visit, video technology does not allow for your provider to perform a traditional examination. This may limit your provider's ability to fully assess your condition. If your provider identifies any concerns that need to be evaluated in person or the need to arrange testing (such as labs, EKG, etc.), we will make arrangements to do so. Although advances in technology are sophisticated, we cannot ensure that it will always work on either your end or our end. If the connection with a video visit is poor, the visit may have to be switched to a telephone visit. With either a video or telephone visit, we are not always able to ensure that we have a secure connection.  By engaging in this virtual visit, you consent to the provision of healthcare and authorize for your insurance to be billed (if applicable) for the services provided during this visit. Depending on your insurance coverage, you may receive a charge related to this service.  I need to obtain your verbal consent now. Are you willing to proceed with your visit today? Marie Perez has provided verbal consent on 11/08/2022 for a virtual visit (video or telephone). Reed Pandy, [place]  Date: 11/08/2022 8:45 AM  Virtual Visit via Video Note   I, Reed Pandy, connected with  Marie Perez  ([id number], 1975-03-20) on 11/08/22 at  8:45 AM EDT by a video-enabled telemedicine application and verified that I am speaking with the correct person using two identifiers.  Location: Patient: Virtual Visit Location Patient:  Home Provider: Virtual Visit Location Provider: Home Office   I discussed the limitations of evaluation and management by telemedicine and the availability of in person appointments. The patient expressed understanding and agreed to proceed.    History of Present Illness: Marie Perez is a 47 y.o. who identifies as a female who was assigned female at birth, and is being seen today for c/o its been since Monday but I think its my sinuses.  Pt states she has a cough, no fever, ears are burning and she has a sore throat.  Pt states she took an alkaselzer cold and Flu and it did help some.  Pt states she has some congestion and feels like something is in her throat but nothing comes up.  Pt states that she thinks she may get a yeast infection if she takes steroids.    HPI: HPI  Problems:  Patient Active Problem List   Diagnosis Date Noted   Musculoskeletal pain 12/24/2010   Sinusitis 05/01/2010   CARPAL TUNNEL SYNDROME, RIGHT 04/05/2010   FATIGUE 02/27/2010   FREQUENCY, URINARY 11/26/2009   OBESITY, UNSPECIFIED 10/31/2008   Essential hypertension 10/31/2008   UTI 10/31/2008   Vaginitis and vulvovaginitis 10/31/2008   Goiter 01/01/2000    Allergies: No Known Allergies Medications:  Current Outpatient Medications:    fluconazole (DIFLUCAN) 150 MG tablet, Take 1 tablet (150 mg total) by mouth once for 1 dose., Disp: 1 tablet, Rfl: 0   fluticasone (FLONASE) 50 MCG/ACT nasal spray, Place 2 sprays into  both nostrils daily., Disp: 16 g, Rfl: 0   guaiFENesin 200 MG tablet, Take 1 tablet (200 mg total) by mouth every 4 (four) hours as needed for up to 7 days for cough or to loosen phlegm., Disp: 30 tablet, Rfl: 0   predniSONE (STERAPRED UNI-PAK 21 TAB) 10 MG (21) TBPK tablet, Take following package directions., Disp: 21 tablet, Rfl: 0   atorvastatin (LIPITOR) 20 MG tablet, Take 1 tablet (20 mg total) by mouth daily., Disp: 90 tablet, Rfl: 3   cetirizine (ZYRTEC) 10 MG tablet, Take 1 tablet by  mouth every day, Disp: 90 tablet, Rfl: 3   esomeprazole (NEXIUM) 20 MG capsule, TAKE 1 CAPSULE(20 MG) BY MOUTH DAILY, Disp: 30 capsule, Rfl: 2   linaclotide (LINZESS) 290 MCG CAPS capsule, Take 1 capsule by mouth on an empty stoamch at least 30 minutes before the first meal of the day, Disp: 90 capsule, Rfl: 3   montelukast (SINGULAIR) 10 MG tablet, Take 1 tablet (10 mg total) by mouth daily., Disp: 90 tablet, Rfl: 3   tirzepatide (MOUNJARO) 2.5 MG/0.5ML Pen, Inject 2.5 mg into the skin once a week., Disp: 2 mL, Rfl: 1   tirzepatide (MOUNJARO) 5 MG/0.5ML Pen, Inject 5 mg into the skin once a week., Disp: 2 mL, Rfl: 1   valsartan-hydrochlorothiazide (DIOVAN-HCT) 320-12.5 MG tablet, Take 1 tablet by mouth daily., Disp: 90 tablet, Rfl: 3   Vitamin D, Ergocalciferol, 50000 units CAPS, Take 1 capsule by mouth once a week., Disp: 12 capsule, Rfl: 3  Observations/Objective: Patient is well-developed, well-nourished in no acute distress.  Resting comfortably at home.  Head is normocephalic, atraumatic.  No labored breathing.  Speech is clear and coherent with logical content.  Patient is alert and oriented at baseline.    Assessment and Plan: 1. Acute sinusitis, recurrence not specified, unspecified location - predniSONE (STERAPRED UNI-PAK 21 TAB) 10 MG (21) TBPK tablet; Take following package directions.  Dispense: 21 tablet; Refill: 0 - guaiFENesin 200 MG tablet; Take 1 tablet (200 mg total) by mouth every 4 (four) hours as needed for up to 7 days for cough or to loosen phlegm.  Dispense: 30 tablet; Refill: 0 - fluticasone (FLONASE) 50 MCG/ACT nasal spray; Place 2 sprays into both nostrils daily.  Dispense: 16 g; Refill: 0 - fluconazole (DIFLUCAN) 150 MG tablet; Take 1 tablet (150 mg total) by mouth once for 1 dose.  Dispense: 1 tablet; Refill: 0  -Discussed that symptoms seem to be viral in nature -Start Guaifenecin, Flonase and Prednisone for symptoms -Fluconazole sent in case she has a yeast  infection while taking prednisone -Advised Pt to follow up with PCP for worsening symptoms.   Follow Up Instructions: I discussed the assessment and treatment plan with the patient. The patient was provided an opportunity to ask questions and all were answered. The patient agreed with the plan and demonstrated an understanding of the instructions.  A copy of instructions were sent to the patient via MyChart unless otherwise noted below.     The patient was advised to call back or seek an in-person evaluation if the symptoms worsen or if the condition fails to improve as anticipated.    Reed Pandy, PA-C

## 2022-11-08 NOTE — Patient Instructions (Signed)
Marie Perez, thank you for joining Reed Pandy, PA-C for today's virtual visit.  While this provider is not your primary care provider (PCP), if your PCP is located in our provider database this encounter information will be shared with them immediately following your visit.   A [facility] MyChart account gives you access to today's visit and all your visits, tests, and labs performed at [facility] " click here if you don't have a [facility] MyChart account or go to [email or web address]  Consent: (Patient) Marie Perez provided verbal consent for this virtual visit at the beginning of the encounter.  Current Medications:  Current Outpatient Medications:    fluconazole (DIFLUCAN) 150 MG tablet, Take 1 tablet (150 mg total) by mouth once for 1 dose., Disp: 1 tablet, Rfl: 0   fluticasone (FLONASE) 50 MCG/ACT nasal spray, Place 2 sprays into both nostrils daily., Disp: 16 g, Rfl: 0   guaiFENesin 200 MG tablet, Take 1 tablet (200 mg total) by mouth every 4 (four) hours as needed for up to 7 days for cough or to loosen phlegm., Disp: 30 tablet, Rfl: 0   predniSONE (STERAPRED UNI-PAK 21 TAB) 10 MG (21) TBPK tablet, Take following package directions., Disp: 21 tablet, Rfl: 0   atorvastatin (LIPITOR) 20 MG tablet, Take 1 tablet (20 mg total) by mouth daily., Disp: 90 tablet, Rfl: 3   cetirizine (ZYRTEC) 10 MG tablet, Take 1 tablet by mouth every day, Disp: 90 tablet, Rfl: 3   esomeprazole (NEXIUM) 20 MG capsule, TAKE 1 CAPSULE(20 MG) BY MOUTH DAILY, Disp: 30 capsule, Rfl: 2   linaclotide (LINZESS) 290 MCG CAPS capsule, Take 1 capsule by mouth on an empty stoamch at least 30 minutes before the first meal of the day, Disp: 90 capsule, Rfl: 3   montelukast (SINGULAIR) 10 MG tablet, Take 1 tablet (10 mg total) by mouth daily., Disp: 90 tablet, Rfl: 3   tirzepatide (MOUNJARO) 2.5 MG/0.5ML Pen, Inject 2.5 mg into the skin once a week., Disp: 2 mL, Rfl: 1   tirzepatide (MOUNJARO)  5 MG/0.5ML Pen, Inject 5 mg into the skin once a week., Disp: 2 mL, Rfl: 1   valsartan-hydrochlorothiazide (DIOVAN-HCT) 320-12.5 MG tablet, Take 1 tablet by mouth daily., Disp: 90 tablet, Rfl: 3   Vitamin D, Ergocalciferol, 50000 units CAPS, Take 1 capsule by mouth once a week., Disp: 12 capsule, Rfl: 3   Medications ordered in this encounter:  Meds ordered this encounter  Medications   predniSONE (STERAPRED UNI-PAK 21 TAB) 10 MG (21) TBPK tablet    Sig: Take following package directions.    Dispense:  21 tablet    Refill:  0    Please dispense one standard blister pack taper.   guaiFENesin 200 MG tablet    Sig: Take 1 tablet (200 mg total) by mouth every 4 (four) hours as needed for up to 7 days for cough or to loosen phlegm.    Dispense:  30 tablet    Refill:  0   fluticasone (FLONASE) 50 MCG/ACT nasal spray    Sig: Place 2 sprays into both nostrils daily.    Dispense:  16 g    Refill:  0   fluconazole (DIFLUCAN) 150 MG tablet    Sig: Take 1 tablet (150 mg total) by mouth once for 1 dose.    Dispense:  1 tablet    Refill:  0     *If you need refills on other medications prior to your next appointment, please contact  your pharmacy*  Follow-Up: Call back or seek an in-person evaluation if the symptoms worsen or if the condition fails to improve as anticipated.  [facility] [phone]  Other Instructions Sinus Infection, Adult A sinus infection, also called sinusitis, is inflammation of your sinuses. Sinuses are hollow spaces in the bones around your face. Your sinuses are located: Around your eyes. In the middle of your forehead. Behind your nose. In your cheekbones. Mucus normally drains out of your sinuses. When your nasal tissues become inflamed or swollen, mucus can become trapped or blocked. This allows bacteria, viruses, and fungi to grow, which leads to infection. Most infections of the sinuses are caused by a virus. A sinus infection can develop  quickly. It can last for up to 4 weeks (acute) or for more than 12 weeks (chronic). A sinus infection often develops after a cold. What are the causes? This condition is caused by anything that creates swelling in the sinuses or stops mucus from draining. This includes: Allergies. Asthma. Infection from bacteria or viruses. Deformities or blockages in your nose or sinuses. Abnormal growths in the nose (nasal polyps). Pollutants, such as chemicals or irritants in the air. Infection from fungi. This is rare. What increases the risk? You are more likely to develop this condition if you: Have a weak body defense system (immune system). Do a lot of swimming or diving. Overuse nasal sprays. Smoke. What are the signs or symptoms? The main symptoms of this condition are pain and a feeling of pressure around the affected sinuses. Other symptoms include: Stuffy nose or congestion that makes it difficult to breathe through your nose. Thick yellow or greenish drainage from your nose. Tenderness, swelling, and warmth over the affected sinuses. A cough that may get worse at night. Decreased sense of smell and taste. Extra mucus that collects in the throat or the back of the nose (postnasal drip) causing a sore throat or bad breath. Tiredness (fatigue). Fever. How is this diagnosed? This condition is diagnosed based on: Your symptoms. Your medical history. A physical exam. Tests to find out if your condition is acute or chronic. This may include: Checking your nose for nasal polyps. Viewing your sinuses using a device that has a light (endoscope). Testing for allergies or bacteria. Imaging tests, such as an MRI or CT scan. In rare cases, a bone biopsy may be done to rule out more serious types of fungal sinus disease. How is this treated? Treatment for a sinus infection depends on the cause and whether your condition is chronic or acute. If caused by a virus, your symptoms should go away on  their own within 10 days. You may be given medicines to relieve symptoms. They include: Medicines that shrink swollen nasal passages (decongestants). A spray that eases inflammation of the nostrils (topical intranasal corticosteroids). Rinses that help get rid of thick mucus in your nose (nasal saline washes). Medicines that treat allergies (antihistamines). Over-the-counter pain relievers. If caused by bacteria, your health care provider may recommend waiting to see if your symptoms improve. Most bacterial infections will get better without antibiotic medicine. You may be given antibiotics if you have: A severe infection. A weak immune system. If caused by narrow nasal passages or nasal polyps, surgery may be needed. Follow these instructions at home: Medicines Take, use, or apply over-the-counter and prescription medicines only as told by your health care provider. These may include nasal sprays. If you were prescribed an antibiotic medicine, take it as told  by your health care provider. Do not stop taking the antibiotic even if you start to feel better. Hydrate and humidify  Drink enough fluid to keep your urine pale yellow. Staying hydrated will help to thin your mucus. Use a cool mist humidifier to keep the humidity level in your home above 50%. Inhale steam for 10-15 minutes, 3-4 times a day, or as told by your health care provider. You can do this in the bathroom while a hot shower is running. Limit your exposure to cool or dry air. Rest Rest as much as possible. Sleep with your head raised (elevated). Make sure you get enough sleep each night. General instructions  Apply a warm, moist washcloth to your face 3-4 times a day or as told by your health care provider. This will help with discomfort. Use nasal saline washes as often as told by your health care provider. Wash your hands often with soap and water to reduce your exposure to germs. If soap and water are not available, use  hand sanitizer. Do not smoke. Avoid being around people who are smoking (secondhand smoke). Keep all follow-up visits. This is important. Contact a health care provider if: You have a fever. Your symptoms get worse. Your symptoms do not improve within 10 days. Get help right away if: You have a severe headache. You have persistent vomiting. You have severe pain or swelling around your face or eyes. You have vision problems. You develop confusion. Your neck is stiff. You have trouble breathing. These symptoms may be an emergency. Get help right away. Call 911. Do not wait to see if the symptoms will go away. Do not drive yourself to the hospital. Summary A sinus infection is soreness and inflammation of your sinuses. Sinuses are hollow spaces in the bones around your face. This condition is caused by nasal tissues that become inflamed or swollen. The swelling traps or blocks the flow of mucus. This allows bacteria, viruses, and fungi to grow, which leads to infection. If you were prescribed an antibiotic medicine, take it as told by your health care provider. Do not stop taking the antibiotic even if you start to feel better. Keep all follow-up visits. This is important. This information is not intended to replace advice given to you by your health care provider. Make sure you discuss any questions you have with your health care provider. Document Revised: 12/11/2020 Document Reviewed: 12/11/2020 Elsevier Patient Education  2024 [employer]vier Inc.    If you have been instructed to have an in-person evaluation today at a local Urgent Care facility, please use the link below. It will take you to a list of all of our available [facility], including address, phone number and hours of operation. Please do not delay care.  [facility]  If you or a family member do not have a primary care provider, use the link below to schedule a visit and establish care. When you  choose a [facility] primary care physician or advanced practice provider, you gain a long-term partner in health. Find a Primary Care Provider  Learn more about [facility]'s in-office and virtual care options: [facility] - Get Care Now

## 2022-11-09 ENCOUNTER — Encounter: Payer: Self-pay | Admitting: Internal Medicine

## 2022-11-10 DIAGNOSIS — F509 Eating disorder, unspecified: Secondary | ICD-10-CM | POA: Diagnosis not present

## 2022-11-10 DIAGNOSIS — E669 Obesity, unspecified: Secondary | ICD-10-CM | POA: Diagnosis not present

## 2022-11-10 DIAGNOSIS — F59 Unspecified behavioral syndromes associated with physiological disturbances and physical factors: Secondary | ICD-10-CM | POA: Diagnosis not present

## 2022-11-10 DIAGNOSIS — Z713 Dietary counseling and surveillance: Secondary | ICD-10-CM | POA: Diagnosis not present

## 2022-11-10 DIAGNOSIS — F1090 Alcohol use, unspecified, uncomplicated: Secondary | ICD-10-CM | POA: Diagnosis not present

## 2022-11-11 ENCOUNTER — Telehealth: Payer: Commercial Managed Care - PPO | Admitting: Emergency Medicine

## 2022-11-11 DIAGNOSIS — B9789 Other viral agents as the cause of diseases classified elsewhere: Secondary | ICD-10-CM | POA: Diagnosis not present

## 2022-11-11 DIAGNOSIS — J019 Acute sinusitis, unspecified: Secondary | ICD-10-CM | POA: Diagnosis not present

## 2022-11-11 NOTE — Patient Instructions (Addendum)
Marie Perez, thank you for joining Cathlyn Parsons, NP for today's virtual visit.  While this provider is not your primary care provider (PCP), if your PCP is located in our provider database this encounter information will be shared with them immediately following your visit.   A [facility] MyChart account gives you access to today's visit and all your visits, tests, and labs performed at [facility] " click here if you don't have a [facility] MyChart account or go to [email or web address]  Consent: (Patient) Marie Perez provided verbal consent for this virtual visit at the beginning of the encounter.  Current Medications:  Current Outpatient Medications:    atorvastatin (LIPITOR) 20 MG tablet, Take 1 tablet (20 mg total) by mouth daily., Disp: 90 tablet, Rfl: 3   cetirizine (ZYRTEC) 10 MG tablet, Take 1 tablet by mouth every day, Disp: 90 tablet, Rfl: 3   esomeprazole (NEXIUM) 20 MG capsule, TAKE 1 CAPSULE(20 MG) BY MOUTH DAILY, Disp: 30 capsule, Rfl: 2   fluticasone (FLONASE) 50 MCG/ACT nasal spray, Place 2 sprays into both nostrils daily., Disp: 16 g, Rfl: 0   guaiFENesin 200 MG tablet, Take 1 tablet (200 mg total) by mouth every 4 (four) hours as needed for up to 7 days for cough or to loosen phlegm., Disp: 100 tablet, Rfl: 0   linaclotide (LINZESS) 290 MCG CAPS capsule, Take 1 capsule by mouth on an empty stoamch at least 30 minutes before the first meal of the day, Disp: 90 capsule, Rfl: 3   montelukast (SINGULAIR) 10 MG tablet, Take 1 tablet (10 mg total) by mouth daily., Disp: 90 tablet, Rfl: 3   predniSONE (DELTASONE) 10 MG tablet, Take as directed on package, Disp: 21 tablet, Rfl: 0   tirzepatide (MOUNJARO) 2.5 MG/0.5ML Pen, Inject 2.5 mg into the skin once a week., Disp: 2 mL, Rfl: 1   tirzepatide (MOUNJARO) 5 MG/0.5ML Pen, Inject 5 mg into the skin once a week., Disp: 2 mL, Rfl: 1   valsartan-hydrochlorothiazide (DIOVAN-HCT) 320-12.5 MG tablet, Take 1  tablet by mouth daily., Disp: 90 tablet, Rfl: 3   Vitamin D, Ergocalciferol, 50000 units CAPS, Take 1 capsule by mouth once a week., Disp: 12 capsule, Rfl: 3   Medications ordered in this encounter:  No orders of the defined types were placed in this encounter.    *If you need refills on other medications prior to your next appointment, please contact your pharmacy*  Follow-Up: Call back or seek an in-person evaluation if the symptoms worsen or if the condition fails to improve as anticipated.  [facility] [phone]  Other Instructions Start taking guaifenesin again. I don't think it caused your chapped cheeks and I think it will help your nasal drainage. If your cheeks get chapped again, stop taking it.   Continue using prescription nose spray.   Add saline nasal spray several times a day to help your congestion drain.   Talk with your anesthesia provider about your worries when you get to the endoscopy and colonoscopy procedure center.    If you have been instructed to have an in-person evaluation today at a local Urgent Care facility, please use the link below. It will take you to a list of all of our available [facility], including address, phone number and hours of operation. Please do not delay care.  [facility]  If you or a family member do not have a primary care provider, use the link  below to schedule a visit and establish care. When you choose a [facility] primary care physician or advanced practice provider, you gain a long-term partner in health. Find a Primary Care Provider  Learn more about [facility] in-office and virtual care options: [facility] - Get Care Now

## 2022-11-11 NOTE — Progress Notes (Signed)
Virtual Visit Consent   Marie Perez, you are scheduled for a virtual visit with a [facility] provider today. Just as with appointments in the office, your consent must be obtained to participate. Your consent will be active for this visit and any virtual visit you may have with one of our providers in the next 365 days. If you have a MyChart account, a copy of this consent can be sent to you electronically.  As this is a virtual visit, video technology does not allow for your provider to perform a traditional examination. This may limit your provider's ability to fully assess your condition. If your provider identifies any concerns that need to be evaluated in person or the need to arrange testing (such as labs, EKG, etc.), we will make arrangements to do so. Although advances in technology are sophisticated, we cannot ensure that it will always work on either your end or our end. If the connection with a video visit is poor, the visit may have to be switched to a telephone visit. With either a video or telephone visit, we are not always able to ensure that we have a secure connection.  By engaging in this virtual visit, you consent to the provision of healthcare and authorize for your insurance to be billed (if applicable) for the services provided during this visit. Depending on your insurance coverage, you may receive a charge related to this service.  I need to obtain your verbal consent now. Are you willing to proceed with your visit today? Marie Perez has provided verbal consent on 11/11/2022 for a virtual visit (video or telephone). Cathlyn Parsons, NP  Date: 11/11/2022 7:57 PM  Virtual Visit via Video Note   I, Cathlyn Parsons, connected with  Marie Perez  ([id number], 1975-07-21) on 11/11/22 at  7:00 PM EDT by a video-enabled telemedicine application and verified that I am speaking with the correct person using two identifiers.  Location: Patient: Virtual Visit Location  Patient: Home Provider: Virtual Visit Location Provider: Home Office   I discussed the limitations of evaluation and management by telemedicine and the availability of in person appointments. The patient expressed understanding and agreed to proceed.    History of Present Illness: Marie Perez is a 47 y.o. who identifies as a female who was assigned female at birth, and is being seen today for headache and sinus pressure. Has been sick for 8 days. Is feeling better today. Was seen in virtual urgent care on 11/08/22 for similar sx, dx with viral sinus infection, rx guaifenesin, prednisone, flonase. Stopped using prednisone and guaifenesin yesterday because she thinks they were making her cheeks chapped, like she was out in the sun. Nasal drainage is clear. Has a lot of post nasal drainage. Does cough up yellow sputum sometimes but does not feel SOB or wheezing; thinks sputum is from post nasal drainage. Denies fever. Using tuylenol and ibuprofen for headache  Is having endoscopy and colonoscopy in a week; is worried about anesthesia and what might be found on scopes  HPI: HPI  Problems:  Patient Active Problem List   Diagnosis Date Noted   Musculoskeletal pain 12/24/2010   Sinusitis 05/01/2010   CARPAL TUNNEL SYNDROME, RIGHT 04/05/2010   FATIGUE 02/27/2010   FREQUENCY, URINARY 11/26/2009   OBESITY, UNSPECIFIED 10/31/2008   Essential hypertension 10/31/2008   UTI 10/31/2008   Vaginitis and vulvovaginitis 10/31/2008   Goiter 01/01/2000    Allergies: No Known Allergies Medications:  Current Outpatient Medications:  atorvastatin (LIPITOR) 20 MG tablet, Take 1 tablet (20 mg total) by mouth daily., Disp: 90 tablet, Rfl: 3   cetirizine (ZYRTEC) 10 MG tablet, Take 1 tablet by mouth every day, Disp: 90 tablet, Rfl: 3   esomeprazole (NEXIUM) 20 MG capsule, TAKE 1 CAPSULE(20 MG) BY MOUTH DAILY, Disp: 30 capsule, Rfl: 2   fluticasone (FLONASE) 50 MCG/ACT nasal spray, Place 2 sprays into both  nostrils daily., Disp: 16 g, Rfl: 0   guaiFENesin 200 MG tablet, Take 1 tablet (200 mg total) by mouth every 4 (four) hours as needed for up to 7 days for cough or to loosen phlegm., Disp: 100 tablet, Rfl: 0   linaclotide (LINZESS) 290 MCG CAPS capsule, Take 1 capsule by mouth on an empty stoamch at least 30 minutes before the first meal of the day, Disp: 90 capsule, Rfl: 3   montelukast (SINGULAIR) 10 MG tablet, Take 1 tablet (10 mg total) by mouth daily., Disp: 90 tablet, Rfl: 3   predniSONE (DELTASONE) 10 MG tablet, Take as directed on package, Disp: 21 tablet, Rfl: 0   tirzepatide (MOUNJARO) 2.5 MG/0.5ML Pen, Inject 2.5 mg into the skin once a week., Disp: 2 mL, Rfl: 1   tirzepatide (MOUNJARO) 5 MG/0.5ML Pen, Inject 5 mg into the skin once a week., Disp: 2 mL, Rfl: 1   valsartan-hydrochlorothiazide (DIOVAN-HCT) 320-12.5 MG tablet, Take 1 tablet by mouth daily., Disp: 90 tablet, Rfl: 3   Vitamin D, Ergocalciferol, 50000 units CAPS, Take 1 capsule by mouth once a week., Disp: 12 capsule, Rfl: 3  Observations/Objective: Patient is well-developed, well-nourished in no acute distress.  Resting comfortably  at home.  Head is normocephalic, atraumatic.  No labored breathing.  Speech is clear and coherent with logical content.  Patient is alert and oriented at baseline.    Assessment and Plan: 1. Acute viral sinusitis  Pt is improvign and nasal discharge is still clear. I think this is still viral. Discussed how antibiotics won't help and she may still have several days of symptoms to recover from virus. Discussed worries about anesthesia - she will share her worries with anestehsia team day of procedure.  Follow Up Instructions: I discussed the assessment and treatment plan with the patient. The patient was provided an opportunity to ask questions and all were answered. The patient agreed with the plan and demonstrated an understanding of the instructions.  A copy of instructions were sent to  the patient via MyChart unless otherwise noted below.   The patient was advised to call back or seek an in-person evaluation if the symptoms worsen or if the condition fails to improve as anticipated.    Cathlyn Parsons, NP

## 2022-11-12 DIAGNOSIS — J0110 Acute frontal sinusitis, unspecified: Secondary | ICD-10-CM | POA: Diagnosis not present

## 2022-11-12 DIAGNOSIS — R052 Subacute cough: Secondary | ICD-10-CM | POA: Diagnosis not present

## 2022-11-12 DIAGNOSIS — H1031 Unspecified acute conjunctivitis, right eye: Secondary | ICD-10-CM | POA: Diagnosis not present

## 2022-11-13 ENCOUNTER — Telehealth: Payer: Self-pay | Admitting: Internal Medicine

## 2022-11-13 ENCOUNTER — Other Ambulatory Visit (HOSPITAL_BASED_OUTPATIENT_CLINIC_OR_DEPARTMENT_OTHER): Payer: Self-pay

## 2022-11-13 ENCOUNTER — Ambulatory Visit: Payer: Commercial Managed Care - PPO | Admitting: Family Medicine

## 2022-11-13 ENCOUNTER — Other Ambulatory Visit: Payer: Self-pay

## 2022-11-13 MED ORDER — PROMETHAZINE-DM 6.25-15 MG/5ML PO SYRP
5.0000 mL | ORAL_SOLUTION | Freq: Four times a day (QID) | ORAL | 0 refills | Status: DC | PRN
  Filled 2022-11-13: qty 180, 5d supply, fill #0

## 2022-11-13 MED ORDER — CROMOLYN SODIUM 4 % OP SOLN
1.0000 [drp] | Freq: Four times a day (QID) | OPHTHALMIC | 0 refills | Status: DC
  Filled 2022-11-13: qty 10, 50d supply, fill #0

## 2022-11-13 MED ORDER — AZITHROMYCIN 250 MG PO TABS
ORAL_TABLET | ORAL | 0 refills | Status: DC
  Filled 2022-11-13: qty 6, 5d supply, fill #0

## 2022-11-13 NOTE — Telephone Encounter (Signed)
Inbound call from patient, states she went to her doctor yesterday and was given a shot medication and "Z pack" due to a nasal infection she would like to know if she can continue with double procedure on 10/28.   Thank you.

## 2022-11-13 NOTE — Telephone Encounter (Signed)
The pt has been advised that she can keep the appt as planned as long as she has not respiratory complaints, fever, etc. She will call back if symptoms worsen or change.

## 2022-11-17 ENCOUNTER — Encounter: Payer: Self-pay | Admitting: Internal Medicine

## 2022-11-17 ENCOUNTER — Other Ambulatory Visit (HOSPITAL_BASED_OUTPATIENT_CLINIC_OR_DEPARTMENT_OTHER): Payer: Self-pay

## 2022-11-17 ENCOUNTER — Ambulatory Visit (AMBULATORY_SURGERY_CENTER): Payer: Commercial Managed Care - PPO | Admitting: Internal Medicine

## 2022-11-17 VITALS — BP 158/90 | HR 86 | Temp 98.4°F | Resp 16 | Ht 65.0 in | Wt 264.0 lb

## 2022-11-17 DIAGNOSIS — K222 Esophageal obstruction: Secondary | ICD-10-CM

## 2022-11-17 DIAGNOSIS — K219 Gastro-esophageal reflux disease without esophagitis: Secondary | ICD-10-CM

## 2022-11-17 DIAGNOSIS — F1090 Alcohol use, unspecified, uncomplicated: Secondary | ICD-10-CM | POA: Diagnosis not present

## 2022-11-17 DIAGNOSIS — Z1211 Encounter for screening for malignant neoplasm of colon: Secondary | ICD-10-CM

## 2022-11-17 DIAGNOSIS — F430 Acute stress reaction: Secondary | ICD-10-CM | POA: Diagnosis not present

## 2022-11-17 DIAGNOSIS — D120 Benign neoplasm of cecum: Secondary | ICD-10-CM

## 2022-11-17 DIAGNOSIS — I10 Essential (primary) hypertension: Secondary | ICD-10-CM | POA: Diagnosis not present

## 2022-11-17 DIAGNOSIS — F509 Eating disorder, unspecified: Secondary | ICD-10-CM | POA: Diagnosis not present

## 2022-11-17 DIAGNOSIS — R1310 Dysphagia, unspecified: Secondary | ICD-10-CM | POA: Diagnosis not present

## 2022-11-17 DIAGNOSIS — D122 Benign neoplasm of ascending colon: Secondary | ICD-10-CM | POA: Diagnosis not present

## 2022-11-17 DIAGNOSIS — K635 Polyp of colon: Secondary | ICD-10-CM | POA: Diagnosis not present

## 2022-11-17 DIAGNOSIS — Z713 Dietary counseling and surveillance: Secondary | ICD-10-CM | POA: Diagnosis not present

## 2022-11-17 DIAGNOSIS — F59 Unspecified behavioral syndromes associated with physiological disturbances and physical factors: Secondary | ICD-10-CM | POA: Diagnosis not present

## 2022-11-17 DIAGNOSIS — E669 Obesity, unspecified: Secondary | ICD-10-CM | POA: Diagnosis not present

## 2022-11-17 MED ORDER — SODIUM CHLORIDE 0.9 % IV SOLN: 500.0000 mL | INTRAVENOUS | Status: DC

## 2022-11-17 MED ORDER — ESOMEPRAZOLE MAGNESIUM 40 MG PO CPDR
40.0000 mg | DELAYED_RELEASE_CAPSULE | Freq: Two times a day (BID) | ORAL | 11 refills | Status: DC
Start: 2022-11-17 — End: 2023-11-30
  Filled 2022-11-17: qty 60, 30d supply, fill #0
  Filled 2023-01-01: qty 60, 30d supply, fill #1
  Filled 2023-01-27: qty 60, 30d supply, fill #2
  Filled 2023-02-26: qty 60, 30d supply, fill #3
  Filled 2023-04-01: qty 60, 30d supply, fill #4
  Filled 2023-05-01: qty 60, 30d supply, fill #5
  Filled 2023-07-21: qty 60, 30d supply, fill #6
  Filled 2023-08-25: qty 60, 30d supply, fill #7
  Filled 2023-10-15 – 2023-11-30 (×2): qty 60, 30d supply, fill #8

## 2022-11-17 NOTE — Progress Notes (Signed)
Report to PACU, RN, vss, BBS= Clear.  

## 2022-11-17 NOTE — Op Note (Signed)
[facility] Patient Name: Marie Perez Procedure Date: 11/17/2022 10:59 AM MRN: [id number] Endoscopist: Wilhemina Bonito. Marina Goodell , MD, [id number] Age: 47 Referring MD:  Date of Birth: 1976/01/03 Gender: Female Account #: [id number] Procedure:                Colonoscopy with cold snare polypectomy x 2 Indications:              Screening for colorectal malignant neoplasm Medicines:                Monitored Anesthesia Care Procedure:                Pre-Anesthesia Assessment:                           - Prior to the procedure, a History and Physical                            was performed, and patient medications and                            allergies were reviewed. The patient's tolerance of                            previous anesthesia was also reviewed. The risks                            and benefits of the procedure and the sedation                            options and risks were discussed with the patient.                            All questions were answered, and informed consent                            was obtained. Prior Anticoagulants: The patient has                            taken no anticoagulant or antiplatelet agents.                            After reviewing the risks and benefits, the patient                            was deemed in satisfactory condition to undergo the                            procedure.                           After obtaining informed consent, the colonoscope                            was passed under direct vision. Throughout the  procedure, the patient's blood pressure, pulse, and                            oxygen saturations were monitored continuously. The                            Olympus Scope [id number] was introduced through the                            anus and advanced to the the cecum, identified by                            appendiceal orifice and ileocecal valve. The                             ileocecal valve, appendiceal orifice, and rectum                            were photographed. The quality of the bowel                            preparation was excellent. The colonoscopy was                            performed without difficulty. The patient tolerated                            the procedure well. The bowel preparation used was                            SUPREP via split dose instruction. Scope In: 11:04:30 AM Scope Out: 11:18:47 AM Scope Withdrawal Time: 0 hours 11 minutes 13 seconds  Total Procedure Duration: 0 hours 14 minutes 17 seconds  Findings:                 Two polyps were found in the ascending colon and                            cecum. The polyps were 1 to 2 mm in size. These                            polyps were removed with a cold snare. Resection                            and retrieval were complete.                           Internal hemorrhoids were found during retroflexion.                           The exam was otherwise without abnormality on                            direct and retroflexion views. Complications:  No immediate complications. Estimated blood loss:                            None. Estimated Blood Loss:     Estimated blood loss: none. Impression:               - Two 1 to 2 mm polyps in the ascending colon and                            in the cecum, removed with a cold snare. Resected                            and retrieved.                           - Internal hemorrhoids.                           - The examination was otherwise normal on direct                            and retroflexion views. Recommendation:           - Repeat colonoscopy in 7-10 years for surveillance.                           - Patient has a contact number available for                            emergencies. The signs and symptoms of potential                            delayed complications were discussed with the                             patient. Return to normal activities tomorrow.                            Written discharge instructions were provided to the                            patient.                           - Resume previous diet.                           - Continue present medications.                           - Await pathology results. Wilhemina Bonito. Marina Goodell, MD 11/17/2022 11:23:23 AM This report has been signed electronically.

## 2022-11-17 NOTE — Progress Notes (Signed)
Expand All Collapse All HISTORY OF PRESENT ILLNESS:   Marie Perez is a 47 y.o. female, married mother of 1 daughter and patient Counsellor for [employer], who is sent today by her primary care provider regarding chronic reflux, dysphagia, and the need for screening colonoscopy.   This patient was seen in this office by the GI nurse practitioner April 10, 2020 regarding chronic constipation, chronic GERD, and the need for colon cancer screening.  See that dictation for details.  She was scheduled for colonoscopy, but canceled due to anxiety over the procedure.   First, the patient reports problems with chronic reflux dating back to the birth of her daughter, who is now 59 years old.  She has been on a number of PPIs.  Currently on Nexium 40 mg daily.  Recently saw her PCP regarding breakthrough reflux symptoms for which twice daily Nexium was recommended.  She tried this for a few weeks.  She thinks it may have helped some.  She has resumed once daily therapy.  She reports at least a 1 year history of intermittent solid food dysphagia.  No weight loss.  Her BMI is approximately 44.  She has not had prior upper endoscopy.   She has chronic constipation.  Currently taking Linzess 290 mcg daily.  She feels this helps.  For her obesity she recently started [facility] 2 weeks ago.  No worsening of constipation.  As a matter fact, she states this has helped her bowels.  No family history of colon cancer.  No bleeding.  GI review of systems is otherwise negative.   Reviewed blood work from March 2024 shows a normal hemoglobin of 12.8.  Normal liver tests.   REVIEW OF SYSTEMS:   All non-GI ROS negative unless otherwise stated in the HPI except for headaches       Past Medical History:  Diagnosis Date   Hyperlipidemia     Hypertension     Vitamin D deficiency                 Past Surgical History:  Procedure Laterality Date   CESAREAN SECTION   01/2008          Social  History ZEYAH COBO  reports that she has never smoked. She has never used smokeless tobacco. She reports that she does not drink alcohol and does not use drugs.   family history includes Diabetes in her father; High Cholesterol in her mother; Hypertension in her mother.   Allergies  No Known Allergies         PHYSICAL EXAMINATION: Vital signs: BP 120/68   Pulse 100   Ht 5\' 5"  (1.651 m)   Wt 264 lb (119.7 kg)   LMP 09/04/2022 (Exact Date)   BMI 43.93 kg/m   Constitutional: Pleasant, generally well-appearing, no acute distress Psychiatric: alert and oriented x3, cooperative Eyes: extraocular movements intact, anicteric, conjunctiva pink Mouth: oral pharynx moist, no lesions Neck: supple no lymphadenopathy Cardiovascular: heart regular rate and rhythm, no murmur Lungs: clear to auscultation bilaterally Abdomen: soft, obese, nontender, nondistended, no obvious ascites, no peritoneal signs, normal bowel sounds, no organomegaly Rectal: Deferred until colonoscopy Extremities: no clubbing, cyanosis, or lower extremity edema bilaterally Skin: no lesions on visible extremities Neuro: No focal deficits.  Cranial nerves intact   ASSESSMENT:   1.  Chronic GERD.  Occasional breakthrough symptoms with once daily PPI. 2.  Intermittent solid food dysphagia.  Rule out peptic stricture 3.  Morbid obesity.  This clearly exacerbates  reflux.  We discussed this.  Recently started [facility] 4.  Colon cancer screening.  Average risk for colorectal cancer.     PLAN:   1.  Reflux precautions with attention to weight loss 2.  Continue Nexium 40 mg daily. 3.  Schedule upper endoscopy with possible esophageal dilation.  The patient is high risk given her morbid obesity.The nature of the procedure, as well as the risks, benefits, and alternatives were carefully and thoroughly reviewed with the patient. Ample time for discussion and questions allowed. The patient understood, was satisfied, and  agreed to proceed. 4.  Schedule colonoscopy for colon cancer screening.  The patient is high risk as above.The nature of the procedure, as well as the risks, benefits, and alternatives were carefully and thoroughly reviewed with the patient. Ample time for discussion and questions allowed. The patient understood, was satisfied, and agreed to proceed. 5.  Hold Mounjaro for at least 1 week prior to the procedures.  She understands and agrees.   Recent completed.  As above.  No interval change.  Now for colonoscopy and upper endoscopy with possible esophageal dilation.

## 2022-11-17 NOTE — Op Note (Signed)
[facility] Patient Name: Marie Perez Procedure Date: 11/17/2022 10:57 AM MRN: [id number] Endoscopist: Wilhemina Bonito. Marina Goodell , MD, [id number] Age: 47 Referring MD:  Date of Birth: 12-10-1975 Gender: Female Account #: [id number] Procedure:                Upper GI endoscopy with balloon dilation of the                            esophagus. 18-20 mm Indications:              Dysphagia, , Therapeutic procedure, Esophageal                            reflux, chronic GERD Medicines:                Monitored Anesthesia Care Procedure:                Pre-Anesthesia Assessment:                           - Prior to the procedure, a History and Physical                            was performed, and patient medications and                            allergies were reviewed. The patient's tolerance of                            previous anesthesia was also reviewed. The risks                            and benefits of the procedure and the sedation                            options and risks were discussed with the patient.                            All questions were answered, and informed consent                            was obtained. Prior Anticoagulants: The patient has                            taken no anticoagulant or antiplatelet agents. ASA                            Grade Assessment: II - A patient with mild systemic                            disease. After reviewing the risks and benefits,                            the patient was deemed in satisfactory condition to  undergo the procedure.                           After obtaining informed consent, the endoscope was                            passed under direct vision. Throughout the                            procedure, the patient's blood pressure, pulse, and                            oxygen saturations were monitored continuously. The                            GIF [id number] #[id number] was introduced  through the                            mouth, and advanced to the second part of duodenum.                            The upper GI endoscopy was accomplished without                            difficulty. The patient tolerated the procedure                            well. Scope In: Scope Out: Findings:                 One benign-appearing, intrinsic moderate ringlike                            stenosis was found 35 cm from the incisors. This                            stenosis measured 1.5 cm (inner diameter). There                            was no associated esophagitis. After completing the                            endoscopic survey, a TTS dilator was passed through                            the scope. Dilation with an 18-19-20 mm balloon                            dilator was performed to 20 mm.                           The exam of the esophagus was otherwise normal.                           The stomach was normal. There  was a moderate hiatal                            hernia.                           The examined duodenum was normal.                           The cardia and gastric fundus were normal on                            retroflexion. Complications:            No immediate complications. Estimated Blood Loss:     Estimated blood loss: none. Impression:               1. GERD with esophageal stricture status post                            dilation                           2. Moderate hiatal hernia. Recommendation:           - Patient has a contact number available for                            emergencies. The signs and symptoms of potential                            delayed complications were discussed with the                            patient. Return to normal activities tomorrow.                            Written discharge instructions were provided to the                            patient.                           -Post dilation diet.                            - Continue present medications.                           -Continue Nexium 40 mg twice daily                           -Return to the care of your primary provider. GI                            follow-up as needed Wilhemina Bonito. Marina Goodell, MD 11/17/2022 11:37:44 AM This report has been signed electronically.

## 2022-11-17 NOTE — Patient Instructions (Addendum)
Follow the post diet today and resume previous medications. Continue taking Nexium 40 mg twice daily. Awaiting pathology results. Repeat Colonoscopy date to be determined based on pathology results.  YOU HAD AN ENDOSCOPIC PROCEDURE TODAY AT THE [facility]:   Refer to the procedure report that was given to you for any specific questions about what was found during the examination.  If the procedure report does not answer your questions, please call your gastroenterologist to clarify.  If you requested that your care partner not be given the details of your procedure findings, then the procedure report has been included in a sealed envelope for you to review at your convenience later.  YOU SHOULD EXPECT: Some feelings of bloating in the abdomen. Passage of more gas than usual.  Walking can help get rid of the air that was put into your GI tract during the procedure and reduce the bloating. If you had a lower endoscopy (such as a colonoscopy or flexible sigmoidoscopy) you may notice spotting of blood in your stool or on the toilet paper. If you underwent a bowel prep for your procedure, you may not have a normal bowel movement for a few days.  Please Note:  You might notice some irritation and congestion in your nose or some drainage.  This is from the oxygen used during your procedure.  There is no need for concern and it should clear up in a day or so.  SYMPTOMS TO REPORT IMMEDIATELY:  Following lower endoscopy (colonoscopy or flexible sigmoidoscopy):  Excessive amounts of blood in the stool  Significant tenderness or worsening of abdominal pains  Swelling of the abdomen that is new, acute  Fever of 100F or higher  Following upper endoscopy (EGD)  Vomiting of blood or coffee ground material  New chest pain or pain under the shoulder blades  Painful or persistently difficult swallowing  New shortness of breath  Fever of 100F or higher  Black, tarry-looking stools  For urgent or  emergent issues, a gastroenterologist can be reached at any hour by calling [phone]. Do not use MyChart messaging for urgent concerns.    DIET:  We do recommend a small meal at first, but then you may proceed to your regular diet.  Drink plenty of fluids but you should avoid alcoholic beverages for 24 hours.  ACTIVITY:  You should plan to take it easy for the rest of today and you should NOT DRIVE or use heavy machinery until tomorrow (because of the sedation medicines used during the test).    FOLLOW UP: Our staff will call the number listed on your records the next business day following your procedure.  We will call around 7:15- 8:00 am to check on you and address any questions or concerns that you may have regarding the information given to you following your procedure. If we do not reach you, we will leave a message.     If any biopsies were taken you will be contacted by phone or by letter within the next 1-3 weeks.  Please call us at [phone] if you have not heard about the biopsies in 3 weeks.    SIGNATURES/CONFIDENTIALITY: You and/or your care partner have signed paperwork which will be entered into your electronic medical record.  These signatures attest to the fact that that the information above on your After Visit Summary has been reviewed and is understood.  Full responsibility of the confidentiality of this discharge information lies with you and/or your care-partner.

## 2022-11-17 NOTE — Progress Notes (Signed)
VS by SS  Pt's states no medical or surgical changes since previsit or office visit.  

## 2022-11-17 NOTE — Progress Notes (Signed)
Called to room to assist during endoscopic procedure.  Patient ID and intended procedure confirmed with present staff. Received instructions for my participation in the procedure from the performing physician.  

## 2022-11-18 ENCOUNTER — Telehealth: Payer: Self-pay

## 2022-11-18 NOTE — Telephone Encounter (Signed)
  Follow up Call-     11/17/2022   10:39 AM  Call back number  Post procedure Call Back phone  # [phone]  Permission to leave phone message Yes     Patient questions:  Do you have a fever, pain , or abdominal swelling? No. Pain Score  0 *  Have you tolerated food without any problems? Yes.    Have you been able to return to your normal activities? Yes.    Do you have any questions about your discharge instructions: Diet   No. Medications  No. Follow up visit  No.  Do you have questions or concerns about your Care? No.  Actions: * If pain score is 4 or above: No action needed, pain <4.

## 2022-11-19 ENCOUNTER — Other Ambulatory Visit (HOSPITAL_BASED_OUTPATIENT_CLINIC_OR_DEPARTMENT_OTHER): Payer: Self-pay

## 2022-11-19 ENCOUNTER — Encounter: Payer: Self-pay | Admitting: Internal Medicine

## 2022-11-19 LAB — SURGICAL PATHOLOGY

## 2022-11-20 ENCOUNTER — Encounter: Payer: Self-pay | Admitting: Internal Medicine

## 2022-11-20 ENCOUNTER — Other Ambulatory Visit (HOSPITAL_BASED_OUTPATIENT_CLINIC_OR_DEPARTMENT_OTHER): Payer: Self-pay

## 2022-11-20 ENCOUNTER — Ambulatory Visit: Payer: Commercial Managed Care - PPO | Admitting: Family Medicine

## 2022-11-24 ENCOUNTER — Other Ambulatory Visit (HOSPITAL_BASED_OUTPATIENT_CLINIC_OR_DEPARTMENT_OTHER): Payer: Self-pay

## 2022-11-24 ENCOUNTER — Ambulatory Visit (INDEPENDENT_AMBULATORY_CARE_PROVIDER_SITE_OTHER): Payer: Commercial Managed Care - PPO | Admitting: Family Medicine

## 2022-11-24 ENCOUNTER — Encounter: Payer: Self-pay | Admitting: Family Medicine

## 2022-11-24 VITALS — BP 152/78 | HR 82 | Temp 98.9°F | Ht 65.0 in | Wt 259.4 lb

## 2022-11-24 DIAGNOSIS — R519 Headache, unspecified: Secondary | ICD-10-CM | POA: Diagnosis not present

## 2022-11-24 DIAGNOSIS — G43829 Menstrual migraine, not intractable, without status migrainosus: Secondary | ICD-10-CM

## 2022-11-24 DIAGNOSIS — I10 Essential (primary) hypertension: Secondary | ICD-10-CM | POA: Diagnosis not present

## 2022-11-24 MED ORDER — AMLODIPINE BESYLATE 5 MG PO TABS
5.0000 mg | ORAL_TABLET | Freq: Every day | ORAL | 3 refills | Status: DC
Start: 2022-11-24 — End: 2023-02-26
  Filled 2022-11-24: qty 30, 30d supply, fill #0
  Filled 2022-12-26: qty 30, 30d supply, fill #1
  Filled 2023-01-27: qty 30, 30d supply, fill #2

## 2022-11-24 NOTE — Progress Notes (Signed)
Established Patient Office Visit   Subjective  Patient ID: Marie Perez, female    DOB: 1975/03/29  Age: 47 y.o. MRN: [id number]  Chief Complaint  Patient presents with   Medical Management of Chronic Issues    3 month follow-up for BP    Pt is a 47 yo female seen for f/u on several issues.  Pt with increasing HAs over the last 2 wks.  Will take Goody''s powder for symptoms.  Pain in R temple and parietal area.  Occasionally in occipital area and back of neck.  Pt notes a soreness/soft spot in R parietal area.  Denies changes in vision, n/v.  Pt also has a h/o menstrual migraines.  Has never seen Neurologist.  BP has not been over 140/90 when having the HAs.  Taking valsartan-hydrochlorothiazide 320-12.5 mg. Endorses a "regular amount of stress" and difficulty staying asleep.  Having more anxiety as worried about HAs.    Patient Active Problem List   Diagnosis Date Noted   Musculoskeletal pain 12/24/2010   Sinusitis 05/01/2010   CARPAL TUNNEL SYNDROME, RIGHT 04/05/2010   FATIGUE 02/27/2010   Fatigue 02/27/2010   FREQUENCY, URINARY 11/26/2009   OBESITY, UNSPECIFIED 10/31/2008   Essential hypertension 10/31/2008   UTI 10/31/2008   Vaginitis and vulvovaginitis 10/31/2008   Goiter 01/01/2000   Past Medical History:  Diagnosis Date   Hyperlipidemia    Hypertension    Vitamin D deficiency    Past Surgical History:  Procedure Laterality Date   CESAREAN SECTION  01/2008   Social History   Tobacco Use   Smoking status: Never   Smokeless tobacco: Never  Vaping Use   Vaping status: Never Used  Substance Use Topics   Alcohol use: No   Drug use: No   Family History  Problem Relation Age of Onset   Hypertension Mother    High Cholesterol Mother    Diabetes Father    Liver disease Neg Hx    Esophageal cancer Neg Hx    Colon cancer Neg Hx    No Known Allergies    ROS Negative unless stated above    Objective:     BP (!) 148/92 (BP Location: Left Arm, Patient  Position: Sitting, Cuff Size: Large)   Pulse 82   Temp 98.9 F (37.2 C) (Oral)   Ht 5\' 5"  (1.651 m)   Wt 259 lb 6.4 oz (117.7 kg)   SpO2 97%   BMI 43.17 kg/m  BP Readings from Last 3 Encounters:  11/24/22 (!) 148/92  11/17/22 (!) 158/90  10/07/22 120/68   Wt Readings from Last 3 Encounters:  11/24/22 259 lb 6.4 oz (117.7 kg)  11/17/22 264 lb (119.7 kg)  10/07/22 264 lb (119.7 kg)      Physical Exam Constitutional:      General: She is not in acute distress.    Appearance: Normal appearance.  HENT:     Head: Normocephalic and atraumatic.     Right Ear: Hearing, tympanic membrane, ear canal and external ear normal.     Left Ear: Hearing, tympanic membrane, ear canal and external ear normal.     Nose:     Right Sinus: Maxillary sinus tenderness present.     Mouth/Throat:     Mouth: Mucous membranes are moist.  Eyes:     Extraocular Movements: Extraocular movements intact.     Conjunctiva/sclera: Conjunctivae normal.     Pupils: Pupils are equal, round, and reactive to light.  Cardiovascular:  Rate and Rhythm: Normal rate and regular rhythm.     Heart sounds: Normal heart sounds. No murmur heard.    No gallop.  Pulmonary:     Effort: Pulmonary effort is normal. No respiratory distress.     Breath sounds: Normal breath sounds. No wheezing, rhonchi or rales.  Skin:    General: Skin is warm and dry.  Neurological:     Mental Status: She is alert and oriented to person, place, and time.      No results found for any visits on 11/24/22.    Assessment & Plan:  Frequent headaches Acutely worse.  Discussed possible causes including sinusitis, uncontrolled HTN, NSAID overuse, hormonal changes, increased stress/anxiety, dehydration, lack of sleep. -Given change in frequency discussed imaging and referral to neurology. -Supportive care -     CT MAXILLOFACIAL WO CONTRAST; Future -     CT HEAD WO CONTRAST ( ); Future -     Referral to neurology  Essential  hypertension -Elevated -Recheck -Continue valsartan-HCTZ 320-12.5 mg daily -Start Norvasc 5 mg daily -Lifestyle modifications -Continue to monitor BP daily and keep a log to bring with you to clinic.  Adjust medication if consistently greater than 140/90. -     Basic metabolic panel; Future -     amLODIPine Besylate; Take 1 tablet (5 mg total) by mouth daily.  Dispense: 30 tablet; Refill: 3  Menstrual migraine without status migrainosus, not intractable -     CT HEAD WO CONTRAST ( ); Future -     Referral to neurology    Return in about 3 months (around 02/24/2023).   Deeann Saint, MD

## 2022-11-25 ENCOUNTER — Other Ambulatory Visit: Payer: Self-pay | Admitting: Family Medicine

## 2022-11-25 DIAGNOSIS — I10 Essential (primary) hypertension: Secondary | ICD-10-CM | POA: Diagnosis not present

## 2022-11-26 LAB — BASIC METABOLIC PANEL
BUN/Creatinine Ratio: 11 (ref 9–23)
BUN: 12 mg/dL (ref 6–24)
CO2: 21 mmol/L (ref 20–29)
Calcium: 9.2 mg/dL (ref 8.7–10.2)
Chloride: 102 mmol/L (ref 96–106)
Creatinine, Ser: 1.07 mg/dL — ABNORMAL HIGH (ref 0.57–1.00)
EGFR: 64
Glucose: 94 mg/dL (ref 70–99)
Potassium: 4 mmol/L (ref 3.5–5.2)
Sodium: 139 mmol/L (ref 134–144)
eGFR: 64 mL/min/{1.73_m2} (ref >59)

## 2022-11-27 ENCOUNTER — Encounter: Payer: Self-pay | Admitting: Neurology

## 2022-11-27 ENCOUNTER — Ambulatory Visit (HOSPITAL_BASED_OUTPATIENT_CLINIC_OR_DEPARTMENT_OTHER)
Admission: RE | Admit: 2022-11-27 | Discharge: 2022-11-27 | Disposition: A | Discharge status: Home / Self Care | Payer: Commercial Managed Care - PPO | Source: Ambulatory Visit | Attending: Family Medicine | Admitting: Family Medicine

## 2022-11-27 DIAGNOSIS — E669 Obesity, unspecified: Secondary | ICD-10-CM | POA: Diagnosis not present

## 2022-11-27 DIAGNOSIS — R519 Headache, unspecified: Secondary | ICD-10-CM | POA: Diagnosis not present

## 2022-11-27 DIAGNOSIS — G43829 Menstrual migraine, not intractable, without status migrainosus: Secondary | ICD-10-CM | POA: Insufficient documentation

## 2022-11-27 DIAGNOSIS — Z713 Dietary counseling and surveillance: Secondary | ICD-10-CM | POA: Diagnosis not present

## 2022-11-27 DIAGNOSIS — F1090 Alcohol use, unspecified, uncomplicated: Secondary | ICD-10-CM | POA: Diagnosis not present

## 2022-11-27 DIAGNOSIS — F59 Unspecified behavioral syndromes associated with physiological disturbances and physical factors: Secondary | ICD-10-CM | POA: Diagnosis not present

## 2022-11-27 DIAGNOSIS — F509 Eating disorder, unspecified: Secondary | ICD-10-CM | POA: Diagnosis not present

## 2022-12-01 ENCOUNTER — Ambulatory Visit: Payer: Commercial Managed Care - PPO | Admitting: Family Medicine

## 2022-12-01 DIAGNOSIS — E669 Obesity, unspecified: Secondary | ICD-10-CM | POA: Diagnosis not present

## 2022-12-01 DIAGNOSIS — F509 Eating disorder, unspecified: Secondary | ICD-10-CM | POA: Diagnosis not present

## 2022-12-01 DIAGNOSIS — Z713 Dietary counseling and surveillance: Secondary | ICD-10-CM | POA: Diagnosis not present

## 2022-12-01 DIAGNOSIS — F1090 Alcohol use, unspecified, uncomplicated: Secondary | ICD-10-CM | POA: Diagnosis not present

## 2022-12-01 DIAGNOSIS — F59 Unspecified behavioral syndromes associated with physiological disturbances and physical factors: Secondary | ICD-10-CM | POA: Diagnosis not present

## 2022-12-04 ENCOUNTER — Encounter: Payer: Self-pay | Admitting: Family Medicine

## 2022-12-05 NOTE — Telephone Encounter (Signed)
MRI not warranted at this time.  Keep upcoming appt with Neurology.

## 2022-12-08 ENCOUNTER — Encounter (HOSPITAL_BASED_OUTPATIENT_CLINIC_OR_DEPARTMENT_OTHER): Payer: Self-pay | Admitting: Radiology

## 2022-12-08 ENCOUNTER — Ambulatory Visit (HOSPITAL_BASED_OUTPATIENT_CLINIC_OR_DEPARTMENT_OTHER)
Admission: RE | Admit: 2022-12-08 | Discharge: 2022-12-08 | Disposition: A | Discharge status: Home / Self Care | Payer: Commercial Managed Care - PPO | Source: Ambulatory Visit | Attending: Family Medicine | Admitting: Family Medicine

## 2022-12-08 DIAGNOSIS — Z713 Dietary counseling and surveillance: Secondary | ICD-10-CM | POA: Diagnosis not present

## 2022-12-08 DIAGNOSIS — F1090 Alcohol use, unspecified, uncomplicated: Secondary | ICD-10-CM | POA: Diagnosis not present

## 2022-12-08 DIAGNOSIS — Z1231 Encounter for screening mammogram for malignant neoplasm of breast: Secondary | ICD-10-CM | POA: Insufficient documentation

## 2022-12-08 DIAGNOSIS — F59 Unspecified behavioral syndromes associated with physiological disturbances and physical factors: Secondary | ICD-10-CM | POA: Diagnosis not present

## 2022-12-08 DIAGNOSIS — E669 Obesity, unspecified: Secondary | ICD-10-CM | POA: Diagnosis not present

## 2022-12-08 DIAGNOSIS — F509 Eating disorder, unspecified: Secondary | ICD-10-CM | POA: Diagnosis not present

## 2022-12-11 ENCOUNTER — Other Ambulatory Visit (HOSPITAL_BASED_OUTPATIENT_CLINIC_OR_DEPARTMENT_OTHER): Payer: Self-pay

## 2022-12-11 ENCOUNTER — Other Ambulatory Visit: Payer: Self-pay

## 2022-12-12 ENCOUNTER — Other Ambulatory Visit: Payer: Self-pay

## 2022-12-12 ENCOUNTER — Other Ambulatory Visit (HOSPITAL_BASED_OUTPATIENT_CLINIC_OR_DEPARTMENT_OTHER): Payer: Self-pay

## 2022-12-17 NOTE — Telephone Encounter (Signed)
Pt called in upset that it has now been two weeks and no one called and let her know anything about her question to get an MRI.   Informed pt of what Dr stated on 11/15 about getting a MRI and pt responded that she is still having bad headaches and really believe she would need one.   Pt would like to know how she can move with scheduling one?  Please advise.

## 2022-12-20 ENCOUNTER — Other Ambulatory Visit (HOSPITAL_BASED_OUTPATIENT_CLINIC_OR_DEPARTMENT_OTHER): Payer: Self-pay

## 2022-12-22 NOTE — Telephone Encounter (Signed)
As the headaches have been more of a chronic issue not an acute issue and the CT was negative, an MRI is not warranted at this time.  It also, unfortunately is not likely to be covered by insurance.  Can discuss further with neurology at upcoming appointment on 01/09/23.

## 2022-12-23 ENCOUNTER — Other Ambulatory Visit (HOSPITAL_BASED_OUTPATIENT_CLINIC_OR_DEPARTMENT_OTHER): Payer: Self-pay

## 2022-12-23 DIAGNOSIS — F59 Unspecified behavioral syndromes associated with physiological disturbances and physical factors: Secondary | ICD-10-CM | POA: Diagnosis not present

## 2022-12-23 DIAGNOSIS — Z713 Dietary counseling and surveillance: Secondary | ICD-10-CM | POA: Diagnosis not present

## 2022-12-23 DIAGNOSIS — F1090 Alcohol use, unspecified, uncomplicated: Secondary | ICD-10-CM | POA: Diagnosis not present

## 2022-12-23 DIAGNOSIS — F509 Eating disorder, unspecified: Secondary | ICD-10-CM | POA: Diagnosis not present

## 2022-12-23 DIAGNOSIS — E669 Obesity, unspecified: Secondary | ICD-10-CM | POA: Diagnosis not present

## 2022-12-23 MED ORDER — MOUNJARO 7.5 MG/0.5ML ~~LOC~~ SOAJ
7.5000 mg | SUBCUTANEOUS | 0 refills | Status: DC
  Filled 2022-12-23 – 2023-01-12 (×3): qty 2, 28d supply, fill #0

## 2022-12-31 NOTE — Progress Notes (Signed)
Initial neurology clinic note  Marie Perez MRN: [id number] DOB: 07-05-1975  Referring provider: Deeann Saint, MD  Primary care provider: Deeann Saint, MD  Reason for consult:  headaches  Subjective:  This is [name], a 47 y.o. right-handed female with a medical history of HTN, HLD, pre-DM, vit D deficiency, right carpal tunnel syndrome while pregnant, obesity who presents to neurology clinic with headaches. The patient is alone today.  Patient has had symptoms for about 1 year. She feels pressure on the right side of her head, from the front to top of her head. It is a burning sensation that feels like a tube running in her head. She has significant neck pain and tightness. She denies photophobia, phonophobia, nausea, or vomiting. She was having symptoms 2-3 times per month but has increased to twice a week. She thinks symptoms occur more often on waking and can last until mid day. It can occur during the day though. When she gets a headache, she will sit in a chair. She does not lay down, so is unsure if it changes with position. She does not notice it changing. She endorses blurry vision but not vision loss. This only started when the headaches started. Patient has been to eye doctor this year. Per patient she had a dilated exam and did not see any issues.  One time she had right face and arm (biceps, forearm, into hand) numbness/tingling with the headache (10/2022). It woke her up from sleep and lasted about 2 hours. She went to the ED. Everything checked out, but she did not have any brain imaging per patient (done at [facility] which I cannot see records). While pregnancy, she was told she had right carpal tunnel syndrome. She mentions that sometimes she feels like she is talking slow as well.  She had a CT of head and maxillofacial on 11/27/22 that was normal.  She has tried tylenol or ibuprofen which may knock the edge off, but does not get rid of the headache. BC  headache powder will help, but tries not to take it often. She will take something 2-3 times per week. She has never had PT or had any headache medications.  Regarding sleep, she usually sleeps well, but headaches can keep her up. She is told she snores. She does not gasp for air. She sometimes feels not well rested when waking up. She feels tired throughout the day. She has never had a sleep study.  Regarding mood, she endorses some occasional anxiety, but otherwise denies depressive symptoms.  Caffeine use: 2 [employer] a week Non-smoker EtOH use: wine once a month Restrictive diet: no Family history of neurologic disease, including headaches: no   MEDICATIONS:  Outpatient Encounter Medications as of 01/09/2023  Medication Sig Note   amLODipine (NORVASC) 5 MG tablet Take 1 tablet (5 mg total) by mouth daily.    atorvastatin (LIPITOR) 20 MG tablet Take 1 tablet (20 mg total) by mouth daily.    cetirizine (ZYRTEC) 10 MG tablet Take 1 tablet by mouth every day    cromolyn (OPTICROM) 4 % ophthalmic solution Place 1 drop into both eyes 4 (four) times daily.    cyclobenzaprine (FLEXERIL) 5 MG tablet TAKE 1 TABLET(5 MG) BY MOUTH EVERY 8 HOURS AS NEEDED FOR MUSCLE SPASMS    esomeprazole (NEXIUM) 40 MG capsule Take 1 capsule (40 mg total) by mouth 2 (two) times daily before a meal.    fluticasone (FLONASE) 50 MCG/ACT nasal spray Place  2 sprays into both nostrils daily.    linaclotide (LINZESS) 290 MCG CAPS capsule Take 1 capsule by mouth on an empty stoamch at least 30 minutes before the first meal of the day    montelukast (SINGULAIR) 10 MG tablet Take 1 tablet (10 mg total) by mouth daily.    promethazine-dextromethorphan (PROMETHAZINE-DM) 6.25-15 MG/5ML syrup Take 5-10 mLs by mouth every 6 (six) hours as needed for cough mainly at bedtime.  Do not take and drive.    tirzepatide (MOUNJARO) 2.5 MG/0.5ML Pen Inject 2.5 mg into the skin once a week.    tirzepatide [facility]) 5 MG/0.5ML Pen Inject 5  mg into the skin once a week.    tirzepatide (MOUNJARO) 7.5 MG/0.5ML Pen Inject 7.5 mg into the skin once a week.    valsartan-hydrochlorothiazide (DIOVAN-HCT) 320-12.5 MG tablet Take 1 tablet by mouth daily.    Vitamin D, Cholecalciferol, 25 MCG (1000 UT) CAPS Take by mouth.    Vitamin D, Ergocalciferol, 50000 units CAPS Take 1 capsule by mouth once a week.    [DISCONTINUED] Phentermine-Topiramate (QSYMIA) 3.75-23 MG CP24 Take 1 tablet by mouth daily for two weeks, then take 2 tablets by mouth daily 05/03/2022: PA required/insurance won't cover and cash price is too high   [DISCONTINUED] tirzepatide (MOUNJARO) 5 MG/0.5ML Pen Inject 5 mg into the skin once a week.    No facility-administered encounter medications on file as of 01/09/2023.    PAST MEDICAL HISTORY: Past Medical History:  Diagnosis Date   Hyperlipidemia    Hypertension    Vitamin D deficiency     PAST SURGICAL HISTORY: Past Surgical History:  Procedure Laterality Date   CESAREAN SECTION  01/2008    ALLERGIES: No Known Allergies  FAMILY HISTORY: Family History  Problem Relation Age of Onset   Hypertension Mother    High Cholesterol Mother    Diabetes Father    Liver disease Neg Hx    Esophageal cancer Neg Hx    Colon cancer Neg Hx     SOCIAL HISTORY: Social History   Tobacco Use   Smoking status: Never   Smokeless tobacco: Never  Vaping Use   Vaping status: Never Used  Substance Use Topics   Alcohol use: No   Drug use: No   Social History   Social History Narrative   Are you right handed or left handed? Right Handed   Are you currently employed ? 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 No   Who lives with you? Husband   What type of home do you live in: 1 story or 2 story? Lives in a one story home        Objective:  Vital Signs:  BP 118/83   Pulse 96   Ht 5\' 5"  (1.651 m)   Wt 255 lb (115.7 kg)   SpO2 97%   BMI  42.43 kg/m   General: No acute distress.  Patient appears well-groomed.   Head:  Normocephalic/atraumatic Eyes:  fundi examined, temporal margins not completely clear, ?papilledema Neck: supple, positive for paraspinal and trapezius tightness and tenderness Lungs: Non-labored breathing on room air   Neurological Exam: Mental status: alert and oriented, speech fluent and not dysarthric, language intact.  Cranial nerves: CN I: not tested CN II: pupils equal, round and reactive to light, visual fields intact CN III, IV, VI:  full range of motion, no nystagmus, no ptosis CN V:  facial sensation intact. CN VII: upper and lower face symmetric CN VIII: hearing intact CN IX, X: uvula midline CN XI: sternocleidomastoid and trapezius muscles intact CN XII: tongue midline  Bulk & Tone: normal, no fasciculations. Motor:  muscle strength 5/5 throughout Deep Tendon Reflexes:  2+ throughout.   Sensation:  Pinprick sensation intact. Phalen and Tinel's test positive at right carpal tunnel with symptoms felt into right bicep as she described in history. Finger to nose testing:  Without dysmetria.   Gait:  Normal station and stride.  Romberg negative.   Labs and Imaging review: Internal labs: BMP (11/25/22): significant for Cr 1.07 ESR (04/30/20) wnl CBC (04/10/20) unremarkable HbA1c (01/31/20): 6.3 Lipid panel (01/23/20): tChol 194, LDL 129, TG 84  Imaging: CT head wo contrast (11/27/22): Per my read: there appears to be a partially empty sella.  Radiology read: FINDINGS: Brain: No evidence of acute infarction, hemorrhage, hydrocephalus, extra-axial collection or mass lesion/mass effect.   Vascular: No hyperdense vessel or unexpected calcification.   Skull: Normal. Negative for fracture or focal lesion.   Sinuses/Orbits: No acute finding.   IMPRESSION: No acute intracranial process.  CT maxillofacial wo contrast (11/27/22): FINDINGS: Osseous: No fracture or mandibular dislocation. No  destructive process.   Orbits: Negative. No traumatic or inflammatory finding.   Sinuses: Clear.   Soft tissues: Negative.   Limited intracranial: No significant or unexpected finding.   IMPRESSION: Unremarkable examination.  Assessment/Plan:  Marie Perez is a 47 y.o. female who presents for evaluation of right sided headache and neck pain and numbness and tingling in right arm. She has a relevant medical history of HTN, HLD, pre-DM, vit D deficiency, right carpal tunnel syndrome while pregnant, obesity. Her neurological examination is pertinent for possible blurring of optic discs bilaterally and positive Phalen and Tinel's test at right carpal tunnel. Available diagnostic data is significant for CT head that may have a partially empty sella per my read. The etiology of patient's headache is currently unclear, but it is possible these are cervicogenic headaches given her neck pain and tightness. There is possible papilledema on non-dilated fundoscopy and partially empty sella on CT head that also raises the question of IIH, particularly given reported vision changes. I will get an LP to evaluate for this and send patient to PT for cervicalgia. Her right arm symptoms are consistent with carpal tunnel syndrome, which she has a known history of in the past.  PLAN: -Blood work: B12, TSH -LP with opening pressure and routine analysis (protein, glucose, cell count) -PT for neck pain -Wrist splint for probable CTS. Will consider EMG if this does not help  -Return to clinic in 3 months  The impression above as well as the plan as outlined below were extensively discussed with the patient who voiced understanding. All questions were answered to their satisfaction.  When available, results of the above investigations and possible further recommendations will be communicated to the patient via telephone/MyChart. Patient to call office if not contacted after expected testing turnaround  time.   Total time spent reviewing records, interview, history/exam, documentation, and coordination of care on day of encounter:  50 min   Thank you for allowing me to participate in patient's care.  If I can answer any additional questions, I would be pleased to do so.  Marie Balint, MD   CC: Deeann Saint, MD [address] [place] [address]  CC: Referring provider: Deeann Saint, MD [address] [place],  [place] [address]

## 2023-01-05 ENCOUNTER — Other Ambulatory Visit (HOSPITAL_BASED_OUTPATIENT_CLINIC_OR_DEPARTMENT_OTHER): Payer: Self-pay

## 2023-01-06 DIAGNOSIS — F1090 Alcohol use, unspecified, uncomplicated: Secondary | ICD-10-CM | POA: Diagnosis not present

## 2023-01-06 DIAGNOSIS — Z713 Dietary counseling and surveillance: Secondary | ICD-10-CM | POA: Diagnosis not present

## 2023-01-06 DIAGNOSIS — F509 Eating disorder, unspecified: Secondary | ICD-10-CM | POA: Diagnosis not present

## 2023-01-06 DIAGNOSIS — E669 Obesity, unspecified: Secondary | ICD-10-CM | POA: Diagnosis not present

## 2023-01-06 DIAGNOSIS — F59 Unspecified behavioral syndromes associated with physiological disturbances and physical factors: Secondary | ICD-10-CM | POA: Diagnosis not present

## 2023-01-07 ENCOUNTER — Other Ambulatory Visit (HOSPITAL_BASED_OUTPATIENT_CLINIC_OR_DEPARTMENT_OTHER): Payer: Self-pay

## 2023-01-07 MED ORDER — MOUNJARO 5 MG/0.5ML ~~LOC~~ SOAJ
5.0000 mg | SUBCUTANEOUS | 0 refills | Status: DC
  Filled 2023-01-07: qty 2, 28d supply, fill #0

## 2023-01-09 ENCOUNTER — Ambulatory Visit: Payer: Commercial Managed Care - PPO | Admitting: Neurology

## 2023-01-09 ENCOUNTER — Encounter: Payer: Self-pay | Admitting: Neurology

## 2023-01-09 ENCOUNTER — Other Ambulatory Visit: Payer: Commercial Managed Care - PPO

## 2023-01-09 VITALS — BP 118/83 | HR 96 | Ht 65.0 in | Wt 255.0 lb

## 2023-01-09 DIAGNOSIS — G5601 Carpal tunnel syndrome, right upper limb: Secondary | ICD-10-CM | POA: Diagnosis not present

## 2023-01-09 DIAGNOSIS — M542 Cervicalgia: Secondary | ICD-10-CM

## 2023-01-09 DIAGNOSIS — H539 Unspecified visual disturbance: Secondary | ICD-10-CM

## 2023-01-09 DIAGNOSIS — G4486 Cervicogenic headache: Secondary | ICD-10-CM | POA: Diagnosis not present

## 2023-01-09 NOTE — Patient Instructions (Addendum)
I saw you today for head and neck pain and numbness and tingling in right arm. I think the right arm symptoms are consistent with carpal tunnel.  Cock up wrist splint for carpal tunnel symptoms can be bought in local drug stores or online. This should especially be worn at night while sleeping.     The headache and neck pain may all be coming from your tight neck. I want to send you to physical therapy to help with this.   It could also be due to increased pressure in your head from poor drainage of fluid around the brain, called idiopathic intracranial hypertension (IIH). We will get a lumbar puncture to evaluate the pressure. If it is elevated, we will need to start a medication for this.  I also want to get lab work today.  I will be in touch when I have your results.  I will see you back in clinic in 3 months. Please let me know if you have any questions or concerns in the meantime.   The physicians and staff at [facility] are committed to providing excellent care. You may receive a survey requesting feedback about your experience at our office. We strive to receive "very good" responses to the survey questions. If you feel that your experience would prevent you from giving the office a "very good " response, please contact our office to try to remedy the situation. We may be reached at [phone]. Thank you for taking the time out of your busy day to complete the survey.  Jacquelyne Balint, MD [facility]

## 2023-01-10 LAB — VITAMIN B12: Vitamin B-12: 435 pg/mL (ref 200–1100)

## 2023-01-10 LAB — TSH: TSH: 0.04 m[IU]/L — ABNORMAL LOW

## 2023-01-12 ENCOUNTER — Encounter: Payer: Self-pay | Admitting: Family Medicine

## 2023-01-12 ENCOUNTER — Other Ambulatory Visit (HOSPITAL_BASED_OUTPATIENT_CLINIC_OR_DEPARTMENT_OTHER): Payer: Self-pay

## 2023-01-12 ENCOUNTER — Other Ambulatory Visit: Payer: Self-pay

## 2023-01-13 DIAGNOSIS — F59 Unspecified behavioral syndromes associated with physiological disturbances and physical factors: Secondary | ICD-10-CM | POA: Diagnosis not present

## 2023-01-13 DIAGNOSIS — E669 Obesity, unspecified: Secondary | ICD-10-CM | POA: Diagnosis not present

## 2023-01-13 DIAGNOSIS — F509 Eating disorder, unspecified: Secondary | ICD-10-CM | POA: Diagnosis not present

## 2023-01-13 DIAGNOSIS — F1090 Alcohol use, unspecified, uncomplicated: Secondary | ICD-10-CM | POA: Diagnosis not present

## 2023-01-13 DIAGNOSIS — Z713 Dietary counseling and surveillance: Secondary | ICD-10-CM | POA: Diagnosis not present

## 2023-01-13 NOTE — Telephone Encounter (Signed)
Notify patient   md on vacation and will have her answer this when she gets back .Marland Kitchen

## 2023-01-15 ENCOUNTER — Encounter: Payer: Self-pay | Admitting: Neurology

## 2023-01-15 NOTE — Telephone Encounter (Signed)
Offer pt if she would like to schedule with another provider to discuss. Pt states that would be fine. Pt denied of any symptoms.   Pt request an appt between 1130 to 1200. Appt made with Dr. Caryl Never at 1145am.

## 2023-01-15 NOTE — Telephone Encounter (Signed)
Spoke to pt. And inform PCP is out and won't be back until second week of January.

## 2023-01-16 ENCOUNTER — Encounter (HOSPITAL_BASED_OUTPATIENT_CLINIC_OR_DEPARTMENT_OTHER): Payer: Self-pay | Admitting: Physical Therapy

## 2023-01-16 ENCOUNTER — Ambulatory Visit (HOSPITAL_BASED_OUTPATIENT_CLINIC_OR_DEPARTMENT_OTHER): Payer: Commercial Managed Care - PPO | Attending: Neurology | Admitting: Physical Therapy

## 2023-01-16 DIAGNOSIS — H539 Unspecified visual disturbance: Secondary | ICD-10-CM | POA: Diagnosis not present

## 2023-01-16 DIAGNOSIS — R293 Abnormal posture: Secondary | ICD-10-CM | POA: Insufficient documentation

## 2023-01-16 DIAGNOSIS — M542 Cervicalgia: Secondary | ICD-10-CM | POA: Diagnosis not present

## 2023-01-16 DIAGNOSIS — G5601 Carpal tunnel syndrome, right upper limb: Secondary | ICD-10-CM | POA: Insufficient documentation

## 2023-01-16 DIAGNOSIS — M25531 Pain in right wrist: Secondary | ICD-10-CM | POA: Insufficient documentation

## 2023-01-16 DIAGNOSIS — G4486 Cervicogenic headache: Secondary | ICD-10-CM | POA: Insufficient documentation

## 2023-01-16 NOTE — Therapy (Signed)
OUTPATIENT PHYSICAL THERAPY CERVICAL EVALUATION   Patient Name: Marie Perez MRN: [id number] DOB:18-Dec-1975, 47 y.o., female Today's Date: 01/17/2023  END OF SESSION:  PT End of Session - 01/16/23 0915     Visit Number 1    Number of Visits 8    Date for PT Re-Evaluation 03/13/23    PT Start Time 0800    PT Stop Time 0847    PT Time Calculation (min) 47 min    Activity Tolerance Patient tolerated treatment well    Behavior During Therapy [facility] for tasks assessed/performed             Past Medical History:  Diagnosis Date   Hyperlipidemia    Hypertension    Vitamin D deficiency    Past Surgical History:  Procedure Laterality Date   CESAREAN SECTION  01/2008   Patient Active Problem List   Diagnosis Date Noted   Musculoskeletal pain 12/24/2010   Sinusitis 05/01/2010   CARPAL TUNNEL SYNDROME, RIGHT 04/05/2010   FATIGUE 02/27/2010   Fatigue 02/27/2010   FREQUENCY, URINARY 11/26/2009   OBESITY, UNSPECIFIED 10/31/2008   Essential hypertension 10/31/2008   UTI 10/31/2008   Vaginitis and vulvovaginitis 10/31/2008   Goiter 01/01/2000    PCP: Abbe Amsterdam MD   REFERRING PROVIDER: Jacquelyne Balint MD   REFERRING DIAG:   THERAPY DIAG:  Cervicalgia - Plan: PT plan of care cert/re-cert  Pain in right wrist - Plan: PT plan of care cert/re-cert  Abnormal posture - Plan: PT plan of care cert/re-cert  Rationale for Evaluation and Treatment: Rehabilitation  ONSET DATE:   SUBJECTIVE:                                                                                                                                                                                                         SUBJECTIVE STATEMENT: Approximately 1 month ago the patient had an acute onset of right wrist and hand pain.  She works at [employer].  Her pain is exacerbated by computer work.  She is right-hand dominant.  About 2 months prior she had onset of neck pain that radiates down to her elbow.  She  reports work on the upper trap improved the pain for short period of time.  She also has a area of pressure behind her eye.  She reports they found some type of fluid behind her eye.  She will further follow-up on this coming up. Hand dominance: Right  PERTINENT HISTORY:  Hypertension, C-section, vitamin D deficiency  PAIN:  Are you having pain? Yes: NPRS scale: Can  reach a 5 out of 10 Pain location: Right cervical spine into right lateral arm to elbow Pain description: Aching Aggravating factors: Looking down at the computer Relieving factors: Rest  PRECAUTIONS: None  RED FLAGS: None     WEIGHT BEARING RESTRICTIONS: No  FALLS:  Has patient fallen in last 6 months? 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  DIAGNOSTIC FINDINGS:    PATIENT SURVEYS:  FOTO 58% ability 69% expected in 10 visits   COGNITION: Overall cognitive status: Within functional limits for tasks assessed  SENSATION: Feels a pressure sensation behind her eye at times POSTURE: No Significant postural limitations  PALPATION: Tenderness to palpation in upper trap and into the right cervical paraspinals.  No significant tenderness to palpation in the anterior shoulder  CERVICAL ROM:   Active ROM A/PROM (deg) eval  Flexion 20 degrees with pain   Extension   Right lateral flexion   Left lateral flexion   Right rotation 63 degrees painful   Left rotation 55 degrees no pain    (Blank rows = not tested)  UPPER EXTREMITY ROM:  Upper extremity MMT Right eval Left eval  Shoulder flexion 4+ 4+  Shoulder extension    Shoulder abduction    Shoulder adduction    Shoulder extension    Shoulder internal rotation 5 5  Shoulder external  rotation 4+ 4+  Elbow flexion    Elbow extension    Wrist flexion    Wrist extension    Wrist ulnar deviation    Wrist radial deviation    Wrist pronation    Wrist supination     (Blank rows = not tested) grip right 40 pounds grip left 45 pounds  UPPER EXTREMITY MMT: Full active upper extremity range of motion of the right arm but mild tightness felt with internal rotation.  TREATMENT DATE:                                                                                                                               Access Code: [email or web address] URL: [email or web address] Date: 01/16/2023 Prepared by: Lorayne Bender  Exercises - Seated Upper Trapezius Stretch  - 1 x daily - 7 x weekly - 3 sets - 3 reps - 20 sec hold  hold - Theracane Over Shoulder  - 1 x daily - 7 x weekly - 3 sets - 1-2 min  hold - Scapular Retraction with Resistance  - 1 x daily - 7 x weekly - 3 sets - 10 reps - Shoulder extension with resistance - Neutral  - 1 x daily - 7 x weekly - 3 sets - 10 reps  Manual: trigger point release to upper traps  and cervical spine   Reviewed bracing types for carpal tunnel  Patient given handout that demonstrates proper workplace set up she is advised to do her best to make sure her screen is at high-level and her wrists are in the neutral position.  PATIENT EDUCATION:  Education details: HEP, symptom management, posture at work  Person educated: Patient Education method: Programmer, multimedia, Facilities manager, Actor cues, Verbal cues, and Handouts Education comprehension: verbalized understanding, returned demonstration, verbal cues required, tactile cues required, and needs further education  HOME EXERCISE PROGRAM: Access Code: [email or web address] URL: [email or web address] Date: 01/16/2023 Prepared by: Lorayne Bender  Exercises - Seated Upper Trapezius Stretch  - 1 x daily - 7 x weekly - 3 sets - 3 reps - 20 sec hold  hold - Theracane Over Shoulder  - 1 x daily - 7 x  weekly - 3 sets - 1-2 min  hold - Scapular Retraction with Resistance  - 1 x daily - 7 x weekly - 3 sets - 10 reps - Shoulder extension with resistance - Neutral  - 1 x daily - 7 x weekly - 3 sets - 10 reps  ASSESSMENT:  CLINICAL IMPRESSION: Patient is a 47 year old female with acute onset of right wrist and right cervical/radicular pain in the right upper extremity.  She has limited cervical mobility into flexion and bilateral rotation.  She has significant spasming of right upper trap.  She had mild tenderness to palpation in her hand.  She has mild grip deficiency but nothing significant.  Her strength is equal on each side with mild limitations in ER and flexion.  She would benefit from skilled therapy for postural exercises secondary to desk job.  We also reviewed proper bracing for carpal tunnel.  Will address this carpal tunnel exercises and strengthening next visit.  She is advised to try to get the brace and see if that helps.  OBJECTIVE IMPAIRMENTS: decreased activity tolerance, decreased mobility, decreased ROM, decreased strength, impaired flexibility, impaired UE functional use, postural dysfunction, and pain.   ACTIVITY LIMITATIONS: carrying, lifting, sitting, and reach over head  PARTICIPATION LIMITATIONS: meal prep, cleaning, laundry, shopping, occupation, and yard work  PERSONAL FACTORS: 1-2 comorbidities: carpal tunnel and cervicalgia;   are also affecting patient's functional outcome.   REHAB POTENTIAL: Good  CLINICAL DECISION MAKING: Evolving/moderate complexity increasing radicualr symptoms along with multiple body parts   EVALUATION COMPLEXITY: Moderate   GOALS: Goals reviewed with patient? Yes  SHORT TERM GOALS: Target date: 02/13/2023    Patient will increase right grip strength by 5 pounds Baseline:  Goal status: INITIAL  2.  Patient will increase bilateral cervical rotation by 10 degrees Baseline:  Goal status: INITIAL  3.  Patient will have full right  internal rotation without feeling of restriction Baseline:  Goal status: INITIAL  4.  Patient will have basic HEP Baseline:  Goal status: INITIAL   LONG TERM GOALS: Target date: 03/14/2023    Patient will perform work tasks without increased pain Baseline:  Goal status: INITIAL  2.  Patient will have full program to promote good posture at work Baseline:  Goal status: INITIAL  3.  Patient proper workplace set up Baseline:  Goal status: INITIAL    PLAN:  PT FREQUENCY: 1-2x/week  PT DURATION: 8 weeks  PLANNED INTERVENTIONS: 97110-Therapeutic exercises, 97530- Therapeutic activity, [id number]- Neuromuscular re-education, 97535- Self Care, 44010- Manual therapy, [id number]- Gait training, [phone]- Aquatic Therapy, 97014- Electrical stimulation (unattended), 97035- Ultrasound, Patient/Family education, Taping, Dry Needling, DME instructions, Cryotherapy, and Moist heat  PLAN FOR NEXT SESSION: Review [place] bracing options if patient is gotten bracing.  Consider trigger point dry needling to upper trap and cervical paraspinals.  Continue manual therapy to cervical spine.  Consider seated series of postural exercises.  Progress to gym activities as tolerated.  Patient is a member of the gym.   Dessie Coma, PT 01/17/2023, 10:37 AM

## 2023-01-19 ENCOUNTER — Ambulatory Visit (INDEPENDENT_AMBULATORY_CARE_PROVIDER_SITE_OTHER): Payer: Commercial Managed Care - PPO | Admitting: Family Medicine

## 2023-01-19 ENCOUNTER — Other Ambulatory Visit: Payer: Self-pay | Admitting: Family Medicine

## 2023-01-19 ENCOUNTER — Telehealth: Payer: Self-pay | Admitting: Family Medicine

## 2023-01-19 ENCOUNTER — Other Ambulatory Visit (HOSPITAL_BASED_OUTPATIENT_CLINIC_OR_DEPARTMENT_OTHER): Payer: Self-pay

## 2023-01-19 ENCOUNTER — Encounter: Payer: Self-pay | Admitting: Family Medicine

## 2023-01-19 VITALS — BP 144/100 | HR 89 | Temp 98.0°F | Wt 255.5 lb

## 2023-01-19 DIAGNOSIS — R7989 Other specified abnormal findings of blood chemistry: Secondary | ICD-10-CM

## 2023-01-19 DIAGNOSIS — I10 Essential (primary) hypertension: Secondary | ICD-10-CM | POA: Diagnosis not present

## 2023-01-19 NOTE — Progress Notes (Signed)
Established Patient Office Visit  Subjective   Patient ID: VISION KITTLER, female    DOB: 1975/05/12  Age: 47 y.o. MRN: [id number]  Chief Complaint  Patient presents with   Results    HPI   Marie Perez is seen today to evaluate low TSH noted recently from labs through neurology.  She had labs there 10 days ago with TSH 0.04.  Last TSH on record prior to that was 12 years ago which was 0.88.  She has had some nonspecific fatigue recently and occasional sweats but denies any tachycardia, tremor, diarrhea.  She has had some modest weight loss but just went on [place] recently and attributes the weight loss to that.  No recent pregnancy.  No known family history of thyroid disorders.  No recent anterior neck pain.  No swallowing difficulties.  No voice changes.  She does have history of hypertension and takes amlodipine and Diovan HCTZ regularly.  She states today's blood pressure somewhat atypical.  No recent headaches or dizziness.  Past Medical History:  Diagnosis Date   Hyperlipidemia    Hypertension    Vitamin D deficiency    Past Surgical History:  Procedure Laterality Date   CESAREAN SECTION  01/2008    reports that she has never smoked. She has never used smokeless tobacco. She reports that she does not drink alcohol and does not use drugs. family history includes Diabetes in her father; High Cholesterol in her mother; Hypertension in her mother. No Known Allergies  Review of Systems  Constitutional:  Positive for malaise/fatigue. Negative for chills and fever.  Cardiovascular:  Negative for chest pain.  Musculoskeletal:  Negative for neck pain.  Neurological:  Negative for dizziness.      Objective:     BP (!) 144/100 (BP Location: Left Arm, Patient Position: Sitting, Cuff Size: Large)   Pulse 89   Temp 98 F (36.7 C) (Oral)   Wt 255 lb 8 oz (115.9 kg)   SpO2 99%   BMI 42.52 kg/m  BP Readings from Last 3 Encounters:  01/19/23 (!) 144/100  01/09/23 118/83   11/24/22 (!) 152/78   Wt Readings from Last 3 Encounters:  01/19/23 255 lb 8 oz (115.9 kg)  01/09/23 255 lb (115.7 kg)  11/24/22 259 lb 6.4 oz (117.7 kg)      Physical Exam Vitals reviewed.  Constitutional:      Appearance: Normal appearance.  Neck:     Comments: She has a thick fold of skin anterior neck but no distinct thyroid masses or nodules noted Cardiovascular:     Rate and Rhythm: Normal rate and regular rhythm.     Heart sounds: No murmur heard. Pulmonary:     Effort: Pulmonary effort is normal.     Breath sounds: Normal breath sounds. No wheezing or rales.  Musculoskeletal:     Cervical back: Neck supple.     Right lower leg: No edema.     Left lower leg: No edema.  Skin:    Findings: No rash.  Neurological:     Mental Status: She is alert.      No results found for any visits on 01/19/23.  Last CBC Lab Results  Component Value Date   WBC 9.4 04/10/2020   HGB 14.5 04/10/2020   HCT 42.5 04/10/2020   MCV 86.7 04/10/2020   MCH 29.6 04/10/2020   RDW 13.8 04/10/2020   PLT 323 04/10/2020   Last metabolic panel Lab Results  Component Value Date   GLUCOSE  94 11/25/2022   NA 139 11/25/2022   K 4.0 11/25/2022   CL 102 11/25/2022   CO2 21 11/25/2022   BUN 12 11/25/2022   CREATININE 1.07 (H) 11/25/2022   EGFR 64 11/25/2022   CALCIUM 9.2 11/25/2022   PROT 7.5 01/23/2020   ALBUMIN 4.5 01/23/2020   LABGLOB 3.0 01/23/2020   AGRATIO 1.5 01/23/2020   BILITOT 0.6 01/23/2020   ALKPHOS 59 01/23/2020   AST 19 01/23/2020   ALT 20 01/23/2020   Last thyroid functions Lab Results  Component Value Date   TSH 0.04 (L) 01/09/2023      The 10-year ASCVD risk score (Arnett DK, et al., 2019) is: 5.5%    Assessment & Plan:   Problem List Items Addressed This Visit   None Visit Diagnoses       Low TSH level    -  Primary   Relevant Orders   TSH   T4, Free   T3, Free   Anti-TPO Ab (RDL)     #1   patient presents with recent low TSH without overt  symptoms of hyperthyroidism.  We reviewed signs and symptoms of hyperthyroidism.  She has had some modest weight loss but just recently went on [facility] which probably accounts for that.  Recommend additional labs as above.  If repeat labs confirm low TSH with elevated free T4 consider endocrinology referral.    #2 hypertension.  Poorly controlled with today's reading.  Recommend close home monitoring and be in touch if consistently greater than 140/90.  Continue weight loss efforts which should help.  Continue close follow-up with Dr. Salomon Fick regarding her blood pressure  No follow-ups on file.    Evelena Peat, MD

## 2023-01-19 NOTE — Telephone Encounter (Signed)
Copied from CRM [phone]. Topic: General - Other >> Jan 19, 2023  9:48 AM Corin V wrote: Reason for CRM: Patient is requesting to pay her copay for her appointment today after 01/21/23 so she can use her flex card. She will pay via MyChart on 01/21/23.

## 2023-01-21 ENCOUNTER — Telehealth: Payer: Commercial Managed Care - PPO | Admitting: Family Medicine

## 2023-01-21 DIAGNOSIS — L304 Erythema intertrigo: Secondary | ICD-10-CM | POA: Diagnosis not present

## 2023-01-21 DIAGNOSIS — N760 Acute vaginitis: Secondary | ICD-10-CM

## 2023-01-21 MED ORDER — NYSTATIN 100000 UNIT/GM EX CREA
1.0000 | TOPICAL_CREAM | Freq: Two times a day (BID) | CUTANEOUS | 0 refills | Status: AC
  Filled 2023-01-21: qty 15, 15d supply, fill #0

## 2023-01-21 MED ORDER — FLUCONAZOLE 150 MG PO TABS
150.0000 mg | ORAL_TABLET | Freq: Once | ORAL | 0 refills | Status: AC
  Filled 2023-01-21: qty 2, 3d supply, fill #0

## 2023-01-21 NOTE — Patient Instructions (Signed)
 Marie Perez, thank you for joining [name], PA-C for today's virtual visit.  While this provider is not your primary care provider (PCP), if your PCP is located in our provider database this encounter information will be shared with them immediately following your visit.   A [facility] MyChart account gives you access to today's visit and all your visits, tests, and labs performed at [facility]  click here if you don't have a [facility] MyChart account or go to [email or web address]  Consent: (Patient) Marie Perez provided verbal consent for this virtual visit at the beginning of the encounter.  Current Medications:  Current Outpatient Medications:    fluconazole  (DIFLUCAN ) 150 MG tablet, Take 1 tablet (150 mg total) by mouth once for 1 dose., Disp: 2 tablet, Rfl: 0   nystatin  cream (MYCOSTATIN ), Apply 1 Application topically 2 (two) times daily for 10 days., Disp: 20 g, Rfl: 0   amLODipine  (NORVASC ) 5 MG tablet, Take 1 tablet (5 mg total) by mouth daily., Disp: 30 tablet, Rfl: 3   atorvastatin  (LIPITOR) 20 MG tablet, Take 1 tablet (20 mg total) by mouth daily., Disp: 90 tablet, Rfl: 3   cetirizine  (ZYRTEC ) 10 MG tablet, Take 1 tablet by mouth every day, Disp: 90 tablet, Rfl: 3   cromolyn  (OPTICROM ) 4 % ophthalmic solution, Place 1 drop into both eyes 4 (four) times daily., Disp: 10 mL, Rfl: 0   cyclobenzaprine  (FLEXERIL ) 5 MG tablet, TAKE 1 TABLET(5 MG) BY MOUTH EVERY 8 HOURS AS NEEDED FOR MUSCLE SPASMS, Disp: , Rfl:    esomeprazole  (NEXIUM ) 40 MG capsule, Take 1 capsule (40 mg total) by mouth 2 (two) times daily before a meal., Disp: 60 capsule, Rfl: 11   fluticasone  (FLONASE ) 50 MCG/ACT nasal spray, Place 2 sprays into both nostrils daily., Disp: 16 g, Rfl: 0   linaclotide  (LINZESS ) 290 MCG CAPS capsule, Take 1 capsule by mouth on an empty stoamch at least 30 minutes before the first meal of the day, Disp: 90 capsule, Rfl: 3   montelukast  (SINGULAIR ) 10 MG  tablet, Take 1 tablet (10 mg total) by mouth daily., Disp: 90 tablet, Rfl: 3   promethazine -dextromethorphan (PROMETHAZINE -DM) 6.25-15 MG/5ML syrup, Take 5-10 mLs by mouth every 6 (six) hours as needed for cough mainly at bedtime.  Do not take and drive., Disp: 819 mL, Rfl: 0   tirzepatide  (MOUNJARO ) 2.5 MG/0.5ML Pen, Inject 2.5 mg into the skin once a week., Disp: 2 mL, Rfl: 1   tirzepatide  (MOUNJARO ) 5 MG/0.5ML Pen, Inject 5 mg into the skin once a week., Disp: 2 mL, Rfl: 0   tirzepatide  (MOUNJARO ) 7.5 MG/0.5ML Pen, Inject 7.5 mg into the skin once a week., Disp: 2 mL, Rfl: 0   valsartan -hydrochlorothiazide  (DIOVAN -HCT) 320-12.5 MG tablet, Take 1 tablet by mouth daily., Disp: 90 tablet, Rfl: 3   Vitamin D , Cholecalciferol, 25 MCG (1000 UT) CAPS, Take by mouth., Disp: , Rfl:    Vitamin D , Ergocalciferol , 50000 units CAPS, Take 1 capsule by mouth once a week., Disp: 12 capsule, Rfl: 3   Medications ordered in this encounter:  Meds ordered this encounter  Medications   fluconazole  (DIFLUCAN ) 150 MG tablet    Sig: Take 1 tablet (150 mg total) by mouth once for 1 dose.    Dispense:  2 tablet    Refill:  0    Take one tab today, if symptoms persist after 72 hours may take 2nd dose.   nystatin  cream (MYCOSTATIN )    Sig: Apply 1  Application topically 2 (two) times daily for 10 days.    Dispense:  20 g    Refill:  0     *If you need refills on other medications prior to your next appointment, please contact your pharmacy*  Follow-Up: Call back or seek an in-person evaluation if the symptoms worsen or if the condition fails to improve as anticipated.  [facility] [phone]  Other Instructions Vaginal Yeast Infection, Adult  Vaginal yeast infection is a condition that causes vaginal discharge as well as soreness, swelling, and redness (inflammation) of the vagina. This is a common condition. Some women get this infection frequently. What are the causes? This condition is  caused by a change in the normal balance of the yeast (Candida) and normal bacteria that live in the vagina. This change causes an overgrowth of yeast, which causes the inflammation. What increases the risk? The condition is more likely to develop in women who: Take antibiotic medicines. Have diabetes. Take birth control pills. Are pregnant. Douche often. Have a weak body defense system (immune system). Have been taking steroid medicines for a long time. Frequently wear tight clothing. What are the signs or symptoms? Symptoms of this condition include: White, thick, creamy vaginal discharge. Swelling, itching, redness, and irritation of the vagina. The lips of the vagina (labia) may be affected as well. Pain or a burning feeling while urinating. Pain during sex. How is this diagnosed? This condition is diagnosed based on: Your medical history. A physical exam. A pelvic exam. Your health care provider will examine a sample of your vaginal discharge under a microscope. Your health care provider may send this sample for testing to confirm the diagnosis. How is this treated? This condition is treated with medicine. Medicines may be over-the-counter or prescription. You may be told to use one or more of the following: Medicine that is taken by mouth (orally). Medicine that is applied as a cream (topically). Medicine that is inserted directly into the vagina (suppository). Follow these instructions at home: Take or apply over-the-counter and prescription medicines only as told by your health care provider. Do not use tampons until your health care provider approves. Do not have sex until your infection has cleared. Sex can prolong or worsen your symptoms of infection. Ask your health care provider when it is safe to resume sexual activity. Keep all follow-up visits. This is important. How is this prevented?  Do not wear tight clothes, such as pantyhose or tight pants. Wear breathable  cotton underwear. Do not use douches, perfumed soap, creams, or powders. Wipe from front to back after using the toilet. If you have diabetes, keep your blood sugar levels under control. Ask your health care provider for other ways to prevent yeast infections. Contact a health care provider if: You have a fever. Your symptoms go away and then return. Your symptoms do not get better with treatment. Your symptoms get worse. You have new symptoms. You develop blisters in or around your vagina. You have blood coming from your vagina and it is not your menstrual period. You develop pain in your abdomen. Summary Vaginal yeast infection is a condition that causes discharge as well as soreness, swelling, and redness (inflammation) of the vagina. This condition is treated with medicine. Medicines may be over-the-counter or prescription. Take or apply over-the-counter and prescription medicines only as told by your health care provider. Do not douche. Resume sexual activity or use of tampons as instructed by your health care provider. Contact  a health care provider if your symptoms do not get better with treatment or your symptoms go away and then return. This information is not intended to replace advice given to you by your health care provider. Make sure you discuss any questions you have with your health care provider. Document Revised: 03/26/2020 Document Reviewed: 03/26/2020 Elsevier Patient Education  2024 Elsevier Inc.  Cottie Intertrigo is skin irritation (inflammation) that happens in warm, moist areas of the body. The irritation can cause a rash and make skin raw and itchy. The rash is usually pink or red. It happens mostly between folds of skin or where skin rubs together, such as: In the armpits. Under the breasts. Under the belly. In the groin area. Around the butt area. Between the toes. This condition is not passed from person to person. What are the causes? Heat,  moisture, rubbing, and not enough air movement. The condition can be made worse by: Sweat. Bacteria. A fungus, such as yeast. What increases the risk? Moisture in your skin folds. You are more likely to develop this condition if you: Are not able to move around. Live in a warm and moist climate. Are not able to control your pee (urine) or poop (stool). Wear splints, braces, or other medical devices. Are overweight. Have diabetes. What are the signs or symptoms? A pink or red skin rash in a skin fold or near a skin fold. Raw or scaly skin. Itching. A burning feeling. Bleeding. Leaking fluid. A bad smell. How is this treated? Cleaning and drying your skin. Taking an antibiotic medicine or using an antibiotic skin cream for a bacterial infection. Using an antifungal cream on your skin or taking pills for an infection that was caused by a fungus, such as yeast. Using a steroid ointment to stop the itching and irritation. Separating the skin fold with a clean cotton cloth to absorb moisture and allow air to flow into the area. Follow these instructions at home: Keep the affected area clean and dry. Do not scratch your skin. Stay cool as much as you can. Use an air conditioner or a fan, if you have one. Apply over-the-counter and prescription medicines only as told by your doctor. If you were prescribed antibiotics, use them as told by your doctor. Do not stop using the antibiotic even if you start to feel better. Keep all follow-up visits. Your doctor may need to check your skin to make sure that the treatment is working. How is this prevented? Shower and dry yourself well after being active. Use a hair dryer on a cool setting to dry between skin folds. Do not wear tight clothes. Wear clothes that: Are loose. Take moisture away from your body. Are made of cotton. Wear a bra that gives good support, if needed. Protect the skin in your groin and butt area as told by your doctor. To  do this: Follow a regular cleaning routine. Use creams, powders, or ointments that protect your skin. Change protection pads often. Stay at a healthy weight. Take care of your feet. This is very important if you have diabetes. You should: Wear shoes that fit well. Keep your feet dry. Wear clean cotton or wool socks. Keep your blood sugar under control if you have diabetes. Contact a doctor if: Your symptoms do not get better with treatment. Your symptoms get worse or they spread. You notice more redness and warmth. You have a fever. This information is not intended to replace advice given to you by your  health care provider. Make sure you discuss any questions you have with your health care provider. Document Revised: 05/30/2021 Document Reviewed: 05/30/2021 Elsevier Patient Education  2024 Elsevier Inc.    If you have been instructed to have an in-person evaluation today at a local Urgent Care facility, please use the link below. It will take you to a list of all of our available [facility], including address, phone number and hours of operation. Please do not delay care.  [facility]  If you or a family member do not have a primary care provider, use the link below to schedule a visit and establish care. When you choose a [facility] primary care physician or advanced practice provider, you gain a long-term partner in health. Find a Primary Care Provider  Learn more about [facility]'s in-office and virtual care options: [facility] - Get Care Now

## 2023-01-21 NOTE — Progress Notes (Signed)
 Virtual Visit Consent   Marie Perez, you are scheduled for a virtual visit with a [facility] provider today. Just as with appointments in the office, your consent must be obtained to participate. Your consent will be active for this visit and any virtual visit you may have with one of our providers in the next 365 days. If you have a MyChart account, a copy of this consent can be sent to you electronically.  As this is a virtual visit, video technology does not allow for your provider to perform a traditional examination. This may limit your provider's ability to fully assess your condition. If your provider identifies any concerns that need to be evaluated in person or the need to arrange testing (such as labs, EKG, etc.), we will make arrangements to do so. Although advances in technology are sophisticated, we cannot ensure that it will always work on either your end or our end. If the connection with a video visit is poor, the visit may have to be switched to a telephone visit. With either a video or telephone visit, we are not always able to ensure that we have a secure connection.  By engaging in this virtual visit, you consent to the provision of healthcare and authorize for your insurance to be billed (if applicable) for the services provided during this visit. Depending on your insurance coverage, you may receive a charge related to this service.  I need to obtain your verbal consent now. Are you willing to proceed with your visit today? [place] KLARYSSA FAUTH has provided verbal consent on 01/21/2023 for a virtual visit (video or telephone). Marie Perez, [place]  Date: 01/21/2023 3:51 PM  Virtual Visit via Video Note   I, Marie Perez, connected with  MURNA BACKER  ([id number], Jul 05, 1975) on 01/21/23 at  3:45 PM EST by a video-enabled telemedicine application and verified that I am speaking with the correct person using two identifiers.  Location: Patient: Virtual Visit Location Patient:  Home Provider: Virtual Visit Location Provider: Home Office   I discussed the limitations of evaluation and management by telemedicine and the availability of in person appointments. The patient expressed understanding and agreed to proceed.    History of Present Illness: Marie Perez is a 48 y.o. who identifies as a female who was assigned female at birth, and is being seen today for c/o yeast infection.  Pt states started vaginal itching and a little discharge two days ago.  Pt states she also has a yeast rash under her breast and abdominal folds that started a week ago.    HPI: HPI  Problems:  Patient Active Problem List   Diagnosis Date Noted   Musculoskeletal pain 12/24/2010   Sinusitis 05/01/2010   CARPAL TUNNEL SYNDROME, RIGHT 04/05/2010   FATIGUE 02/27/2010   Fatigue 02/27/2010   FREQUENCY, URINARY 11/26/2009   OBESITY, UNSPECIFIED 10/31/2008   Essential hypertension 10/31/2008   UTI 10/31/2008   Vaginitis and vulvovaginitis 10/31/2008   Goiter 01/01/2000    Allergies: No Known Allergies Medications:  Current Outpatient Medications:    fluconazole  (DIFLUCAN ) 150 MG tablet, Take 1 tablet (150 mg total) by mouth once for 1 dose., Disp: 2 tablet, Rfl: 0   nystatin  cream (MYCOSTATIN ), Apply 1 Application topically 2 (two) times daily for 10 days., Disp: 20 g, Rfl: 0   amLODipine  (NORVASC ) 5 MG tablet, Take 1 tablet (5 mg total) by mouth daily., Disp: 30 tablet, Rfl: 3   atorvastatin  (LIPITOR) 20 MG tablet, Take 1  tablet (20 mg total) by mouth daily., Disp: 90 tablet, Rfl: 3   cetirizine  (ZYRTEC ) 10 MG tablet, Take 1 tablet by mouth every day, Disp: 90 tablet, Rfl: 3   cromolyn  (OPTICROM ) 4 % ophthalmic solution, Place 1 drop into both eyes 4 (four) times daily., Disp: 10 mL, Rfl: 0   cyclobenzaprine  (FLEXERIL ) 5 MG tablet, TAKE 1 TABLET(5 MG) BY MOUTH EVERY 8 HOURS AS NEEDED FOR MUSCLE SPASMS, Disp: , Rfl:    esomeprazole  (NEXIUM ) 40 MG capsule, Take 1 capsule (40 mg total)  by mouth 2 (two) times daily before a meal., Disp: 60 capsule, Rfl: 11   fluticasone  (FLONASE ) 50 MCG/ACT nasal spray, Place 2 sprays into both nostrils daily., Disp: 16 g, Rfl: 0   linaclotide  (LINZESS ) 290 MCG CAPS capsule, Take 1 capsule by mouth on an empty stoamch at least 30 minutes before the first meal of the day, Disp: 90 capsule, Rfl: 3   montelukast  (SINGULAIR ) 10 MG tablet, Take 1 tablet (10 mg total) by mouth daily., Disp: 90 tablet, Rfl: 3   promethazine -dextromethorphan (PROMETHAZINE -DM) 6.25-15 MG/5ML syrup, Take 5-10 mLs by mouth every 6 (six) hours as needed for cough mainly at bedtime.  Do not take and drive., Disp: 819 mL, Rfl: 0   tirzepatide  (MOUNJARO ) 2.5 MG/0.5ML Pen, Inject 2.5 mg into the skin once a week., Disp: 2 mL, Rfl: 1   tirzepatide  (MOUNJARO ) 5 MG/0.5ML Pen, Inject 5 mg into the skin once a week., Disp: 2 mL, Rfl: 0   tirzepatide  (MOUNJARO ) 7.5 MG/0.5ML Pen, Inject 7.5 mg into the skin once a week., Disp: 2 mL, Rfl: 0   valsartan -hydrochlorothiazide  (DIOVAN -HCT) 320-12.5 MG tablet, Take 1 tablet by mouth daily., Disp: 90 tablet, Rfl: 3   Vitamin D , Cholecalciferol, 25 MCG (1000 UT) CAPS, Take by mouth., Disp: , Rfl:    Vitamin D , Ergocalciferol , 50000 units CAPS, Take 1 capsule by mouth once a week., Disp: 12 capsule, Rfl: 3  Observations/Objective: Patient is well-developed, well-nourished in no acute distress.  Resting comfortably at home.  Head is normocephalic, atraumatic.  No labored breathing.  Speech is clear and coherent with logical content.  Patient is alert and oriented at baseline.    Assessment and Plan: 1. Vaginosis (Primary) - fluconazole  (DIFLUCAN ) 150 MG tablet; Take 1 tablet (150 mg total) by mouth once for 1 dose.  Dispense: 2 tablet; Refill: 0  2. Intertrigo - nystatin  cream (MYCOSTATIN ); Apply 1 Application topically 2 (two) times daily for 10 days.  Dispense: 20 g; Refill: 0  -Start Fluconazole  and Nystatin  cream -Advised Pt to  follow up with PCP or urgent care for continual or worsening symptoms.   Follow Up Instructions: I discussed the assessment and treatment plan with the patient. The patient was provided an opportunity to ask questions and all were answered. The patient agreed with the plan and demonstrated an understanding of the instructions.  A copy of instructions were sent to the patient via MyChart unless otherwise noted below.    The patient was advised to call back or seek an in-person evaluation if the symptoms worsen or if the condition fails to improve as anticipated.    Marie Mater, PA-C

## 2023-01-22 ENCOUNTER — Ambulatory Visit (HOSPITAL_BASED_OUTPATIENT_CLINIC_OR_DEPARTMENT_OTHER): Payer: Commercial Managed Care - PPO | Attending: Neurology | Admitting: Physical Therapy

## 2023-01-22 ENCOUNTER — Other Ambulatory Visit: Payer: Self-pay

## 2023-01-22 ENCOUNTER — Encounter (HOSPITAL_BASED_OUTPATIENT_CLINIC_OR_DEPARTMENT_OTHER): Payer: Self-pay | Admitting: Physical Therapy

## 2023-01-22 ENCOUNTER — Other Ambulatory Visit (HOSPITAL_BASED_OUTPATIENT_CLINIC_OR_DEPARTMENT_OTHER): Payer: Self-pay

## 2023-01-22 DIAGNOSIS — F509 Eating disorder, unspecified: Secondary | ICD-10-CM | POA: Diagnosis not present

## 2023-01-22 DIAGNOSIS — M542 Cervicalgia: Secondary | ICD-10-CM | POA: Insufficient documentation

## 2023-01-22 DIAGNOSIS — F1090 Alcohol use, unspecified, uncomplicated: Secondary | ICD-10-CM | POA: Diagnosis not present

## 2023-01-22 DIAGNOSIS — M25531 Pain in right wrist: Secondary | ICD-10-CM | POA: Diagnosis not present

## 2023-01-22 DIAGNOSIS — R293 Abnormal posture: Secondary | ICD-10-CM | POA: Diagnosis not present

## 2023-01-22 DIAGNOSIS — F59 Unspecified behavioral syndromes associated with physiological disturbances and physical factors: Secondary | ICD-10-CM | POA: Diagnosis not present

## 2023-01-22 DIAGNOSIS — E669 Obesity, unspecified: Secondary | ICD-10-CM | POA: Diagnosis not present

## 2023-01-22 DIAGNOSIS — Z713 Dietary counseling and surveillance: Secondary | ICD-10-CM | POA: Diagnosis not present

## 2023-01-22 MED ORDER — MOUNJARO 7.5 MG/0.5ML ~~LOC~~ SOAJ
7.5000 mg | SUBCUTANEOUS | 1 refills | Status: DC
  Filled 2023-01-22 – 2023-02-09 (×5): qty 2, 28d supply, fill #0
  Filled 2023-03-07 (×2): qty 2, 28d supply, fill #1

## 2023-01-22 NOTE — Therapy (Signed)
 OUTPATIENT PHYSICAL THERAPY CERVICAL EVALUATION   Patient Name: Marie Perez MRN: [id number] DOB:1975-06-16, 48 y.o., female Today's Date: 01/22/2023  END OF SESSION:  PT End of Session - 01/22/23 1104     Visit Number 2    Number of Visits 8    Date for PT Re-Evaluation 03/13/23    PT Start Time 0845    PT Stop Time 0928    PT Time Calculation (min) 43 min    Activity Tolerance Patient tolerated treatment well    Behavior During Therapy [facility] for tasks assessed/performed              Past Medical History:  Diagnosis Date   Hyperlipidemia    Hypertension    Vitamin D  deficiency    Past Surgical History:  Procedure Laterality Date   CESAREAN SECTION  01/2008   Patient Active Problem List   Diagnosis Date Noted   Musculoskeletal pain 12/24/2010   Sinusitis 05/01/2010   CARPAL TUNNEL SYNDROME, RIGHT 04/05/2010   FATIGUE 02/27/2010   Fatigue 02/27/2010   FREQUENCY, URINARY 11/26/2009   OBESITY, UNSPECIFIED 10/31/2008   Essential hypertension 10/31/2008   UTI 10/31/2008   Vaginitis and vulvovaginitis 10/31/2008   Goiter 01/01/2000    PCP: Clotilda Single MD   REFERRING PROVIDER: Venetia Potters MD   REFERRING DIAG:   THERAPY DIAG:  Cervicalgia  Pain in right wrist  Abnormal posture  Rationale for Evaluation and Treatment: Rehabilitation  ONSET DATE:   SUBJECTIVE:                                                                                                                                                                                                         SUBJECTIVE STATEMENT: The patient reports she has gotten a little better. She is having less pain at work and down her arm> She had a burning today into her upper trap;    Eval: Approximately 1 month ago the patient had an acute onset of right wrist and hand pain.  She works at [employer].  Her pain is exacerbated by computer work.  She is right-hand dominant.  About 2 months prior she had onset of  neck pain that radiates down to her elbow.  She reports work on the upper trap improved the pain for short period of time.  She also has a area of pressure behind her eye.  She reports they found some type of fluid behind her eye.  She will further follow-up on this coming up. Hand dominance: Right  PERTINENT HISTORY:  Hypertension, C-section, vitamin  D deficiency  PAIN:  Are you having pain? Yes: NPRS scale: Can reach a 5 out of 10 Pain location: Right cervical spine into right lateral arm to elbow Pain description: Aching Aggravating factors: Looking down at the computer Relieving factors: Rest  PRECAUTIONS: None  RED FLAGS: None     WEIGHT BEARING RESTRICTIONS: No  FALLS:  Has patient fallen in last 6 months? 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  DIAGNOSTIC FINDINGS:    PATIENT SURVEYS:  FOTO 58% ability 69% expected in 10 visits   COGNITION: Overall cognitive status: Within functional limits for tasks assessed  SENSATION: Feels a pressure sensation behind her eye at times POSTURE: No Significant postural limitations  PALPATION: Tenderness to palpation in upper trap and into the right cervical paraspinals.  No significant tenderness to palpation in the anterior shoulder  CERVICAL ROM:   Active ROM A/PROM (deg) eval  Flexion 20 degrees with pain   Extension   Right lateral flexion   Left lateral flexion   Right rotation 63 degrees painful   Left rotation 55 degrees no pain    (Blank rows = not tested)  UPPER EXTREMITY ROM:  Upper extremity MMT Right eval Left eval  Shoulder flexion 4+ 4+  Shoulder extension    Shoulder abduction    Shoulder adduction    Shoulder extension     Shoulder internal rotation 5 5  Shoulder external rotation 4+ 4+  Elbow flexion    Elbow extension    Wrist flexion    Wrist extension    Wrist ulnar deviation    Wrist radial deviation    Wrist pronation    Wrist supination     (Blank rows = not tested) grip right 40 pounds grip left 45 pounds  UPPER EXTREMITY MMT: Full active upper extremity range of motion of the right arm but mild tightness felt with internal rotation.  TREATMENT DATE:                                                                                                                               1/2 Manual: trigger point release to upper trap and cervical paraspinals; sub-occipital release; gentle annual traction   Bilateral er 2x10 yellow  Bilateral horizontal abduction 2x10 yellow  Bilateral flexion  2x10 yellow band   Levator stretch 3x20 sec hold for the right. Reviewed HEP      Eval:  Access Code: [email or web address] URL: [email or web address] Date: 01/16/2023 Prepared by: Alm Don  Exercises - Seated Upper Trapezius Stretch  - 1 x daily - 7 x weekly - 3 sets - 3 reps - 20 sec hold  hold - Theracane Over Shoulder  -  1 x daily - 7 x weekly - 3 sets - 1-2 min  hold - Scapular Retraction with Resistance  - 1 x daily - 7 x weekly - 3 sets - 10 reps - Shoulder extension with resistance - Neutral  - 1 x daily - 7 x weekly - 3 sets - 10 reps  Manual: trigger point release to upper traps and cervical spine   Reviewed bracing types for carpal tunnel  Patient given handout that demonstrates proper workplace set up she is advised to do her best to make sure her screen is at high-level and her wrists are in the neutral position.  PATIENT EDUCATION:  Education details: HEP, symptom management, posture at work  Person educated: Patient Education method: Programmer, Multimedia, Facilities Manager, Actor cues, Verbal cues, and Handouts Education comprehension: verbalized understanding, returned demonstration,  verbal cues required, tactile cues required, and needs further education  HOME EXERCISE PROGRAM: Access Code: [email or web address] URL: [email or web address] Date: 01/16/2023 Prepared by: Alm Don  Exercises - Seated Upper Trapezius Stretch  - 1 x daily - 7 x weekly - 3 sets - 3 reps - 20 sec hold  hold - Theracane Over Shoulder  - 1 x daily - 7 x weekly - 3 sets - 1-2 min  hold - Scapular Retraction with Resistance  - 1 x daily - 7 x weekly - 3 sets - 10 reps - Shoulder extension with resistance - Neutral  - 1 x daily - 7 x weekly - 3 sets - 10 reps  ASSESSMENT:  CLINICAL IMPRESSION: The patient tolerated treatment well. Per palpation her spasm is a little better in her upper trap. She has been more aware of her posture at work. Therapy added a seated postural series she can do at home. We reviewed progression of reps and resistance. She declined needling today. We will use it id needed. We updated her HEP.     Patient is a 48 year old female with acute onset of right wrist and right cervical/radicular pain in the right upper extremity.  She has limited cervical mobility into flexion and bilateral rotation.  She has significant spasming of right upper trap.  She had mild tenderness to palpation in her hand.  She has mild grip deficiency but nothing significant.  Her strength is equal on each side with mild limitations in ER and flexion.  She would benefit from skilled therapy for postural exercises secondary to desk job.  We also reviewed proper bracing for carpal tunnel.  Will address this carpal tunnel exercises and strengthening next visit.  She is advised to try to get the brace and see if that helps.  OBJECTIVE IMPAIRMENTS: decreased activity tolerance, decreased mobility, decreased ROM, decreased strength, impaired flexibility, impaired UE functional use, postural dysfunction, and pain.   ACTIVITY LIMITATIONS: carrying, lifting, sitting, and reach over head  PARTICIPATION  LIMITATIONS: meal prep, cleaning, laundry, shopping, occupation, and yard work  PERSONAL FACTORS: 1-2 comorbidities: carpal tunnel and cervicalgia;   are also affecting patient's functional outcome.   REHAB POTENTIAL: Good  CLINICAL DECISION MAKING: Evolving/moderate complexity increasing radicualr symptoms along with multiple body parts   EVALUATION COMPLEXITY: Moderate   GOALS: Goals reviewed with patient? Yes  SHORT TERM GOALS: Target date: 02/13/2023    Patient will increase right grip strength by 5 pounds Baseline:  Goal status: INITIAL  2.  Patient will increase bilateral cervical rotation by 10 degrees Baseline:  Goal status: INITIAL  3.  Patient will have full right internal rotation without feeling of restriction Baseline:  Goal status: INITIAL  4.  Patient will have basic HEP Baseline:  Goal status: INITIAL   LONG TERM GOALS: Target date: 03/14/2023    Patient will perform work tasks without increased pain Baseline:  Goal status: INITIAL  2.  Patient will have full program to promote good posture at work Baseline:  Goal status: INITIAL  3.  Patient proper workplace set up Baseline:  Goal status: INITIAL    PLAN:  PT FREQUENCY: 1-2x/week  PT DURATION: 8 weeks  PLANNED INTERVENTIONS: 97110-Therapeutic exercises, 97530- Therapeutic activity, [id number]- Neuromuscular re-education, 97535- Self Care, [id number]- Manual therapy, [id number]- Gait training, [phone]- Aquatic Therapy, 97014- Electrical stimulation (unattended), 97035- Ultrasound, Patient/Family education, Taping, Dry Needling, DME instructions, Cryotherapy, and Moist heat   PLAN FOR NEXT SESSION: Review Barrick bracing options if patient is gotten bracing.  Consider trigger point dry needling to upper trap and cervical paraspinals.  Continue manual therapy to cervical spine.  Consider seated series of postural exercises.  Progress to gym activities as tolerated.  Patient is a member of the gym.   Alm JINNY Don, PT 01/22/2023, 11:21 AM

## 2023-01-23 ENCOUNTER — Other Ambulatory Visit (HOSPITAL_BASED_OUTPATIENT_CLINIC_OR_DEPARTMENT_OTHER): Payer: Self-pay

## 2023-01-23 ENCOUNTER — Encounter: Payer: Self-pay | Admitting: Neurology

## 2023-01-23 MED ORDER — FLUTICASONE PROPIONATE 50 MCG/ACT NA SUSP
2.0000 | Freq: Every day | NASAL | 0 refills | Status: DC
  Filled 2023-01-23: qty 16, 30d supply, fill #0

## 2023-01-24 LAB — T3, FREE: T3, Free: 3.9 pg/mL (ref 2.0–4.4)

## 2023-01-24 LAB — TSH: TSH: 0.025 u[IU]/mL — ABNORMAL LOW (ref 0.450–4.500)

## 2023-01-24 LAB — T4, FREE: Free T4: 1.61 ng/dL (ref 0.82–1.77)

## 2023-01-24 LAB — ANTI-TPO AB (RDL): Anti-TPO Ab (RDL): <9 [IU]/mL (ref <9.0)

## 2023-01-26 ENCOUNTER — Other Ambulatory Visit (HOSPITAL_BASED_OUTPATIENT_CLINIC_OR_DEPARTMENT_OTHER): Payer: Self-pay

## 2023-02-03 ENCOUNTER — Ambulatory Visit (HOSPITAL_BASED_OUTPATIENT_CLINIC_OR_DEPARTMENT_OTHER): Payer: Commercial Managed Care - PPO | Admitting: Physical Therapy

## 2023-02-03 ENCOUNTER — Encounter (HOSPITAL_BASED_OUTPATIENT_CLINIC_OR_DEPARTMENT_OTHER): Payer: Self-pay | Admitting: Physical Therapy

## 2023-02-03 DIAGNOSIS — R293 Abnormal posture: Secondary | ICD-10-CM | POA: Diagnosis not present

## 2023-02-03 DIAGNOSIS — E669 Obesity, unspecified: Secondary | ICD-10-CM | POA: Diagnosis not present

## 2023-02-03 DIAGNOSIS — M542 Cervicalgia: Secondary | ICD-10-CM

## 2023-02-03 DIAGNOSIS — F1090 Alcohol use, unspecified, uncomplicated: Secondary | ICD-10-CM | POA: Diagnosis not present

## 2023-02-03 DIAGNOSIS — F509 Eating disorder, unspecified: Secondary | ICD-10-CM | POA: Diagnosis not present

## 2023-02-03 DIAGNOSIS — M25531 Pain in right wrist: Secondary | ICD-10-CM | POA: Diagnosis not present

## 2023-02-03 DIAGNOSIS — Z713 Dietary counseling and surveillance: Secondary | ICD-10-CM | POA: Diagnosis not present

## 2023-02-03 DIAGNOSIS — F59 Unspecified behavioral syndromes associated with physiological disturbances and physical factors: Secondary | ICD-10-CM | POA: Diagnosis not present

## 2023-02-03 NOTE — Therapy (Signed)
 OUTPATIENT PHYSICAL THERAPY CERVICAL EVALUATION   Patient Name: Marie Perez MRN: [id number] DOB:06/22/1975, 48 y.o., female Today's Date: 02/03/2023  END OF SESSION:  PT End of Session - 02/03/23 0843     Visit Number 3    Number of Visits 8    Date for PT Re-Evaluation 03/13/23    PT Start Time 0847    PT Stop Time 0928    PT Time Calculation (min) 41 min    Activity Tolerance Patient tolerated treatment well    Behavior During Therapy [facility] for tasks assessed/performed              Past Medical History:  Diagnosis Date   Hyperlipidemia    Hypertension    Vitamin D  deficiency    Past Surgical History:  Procedure Laterality Date   CESAREAN SECTION  01/2008   Patient Active Problem List   Diagnosis Date Noted   Musculoskeletal pain 12/24/2010   Sinusitis 05/01/2010   CARPAL TUNNEL SYNDROME, RIGHT 04/05/2010   FATIGUE 02/27/2010   Fatigue 02/27/2010   FREQUENCY, URINARY 11/26/2009   OBESITY, UNSPECIFIED 10/31/2008   Essential hypertension 10/31/2008   UTI 10/31/2008   Vaginitis and vulvovaginitis 10/31/2008   Goiter 01/01/2000    PCP: Clotilda Single MD   REFERRING PROVIDER: Venetia Potters MD   REFERRING DIAG:   THERAPY DIAG:  Cervicalgia  Pain in right wrist  Abnormal posture  Rationale for Evaluation and Treatment: Rehabilitation  ONSET DATE:   SUBJECTIVE:                                                                                                                                                                                                         SUBJECTIVE STATEMENT: Feeling a little better but seems to have moved more to the center of my upper back.    Eval: Approximately 1 month ago the patient had an acute onset of right wrist and hand pain.  She works at [employer].  Her pain is exacerbated by computer work.  She is right-hand dominant.  About 2 months prior she had onset of neck pain that radiates down to her elbow.  She reports work on  the upper trap improved the pain for short period of time.  She also has a area of pressure behind her eye.  She reports they found some type of fluid behind her eye.  She will further follow-up on this coming up. Hand dominance: Right  PERTINENT HISTORY:  Hypertension, C-section, vitamin D  deficiency  PAIN:  Are you having pain? Yes: NPRS scale:  Can reach a 5 out of 10 Pain location: Right cervical spine into right lateral arm to elbow Pain description: Aching Aggravating factors: Looking down at the computer Relieving factors: Rest  PRECAUTIONS: None  RED FLAGS: None     WEIGHT BEARING RESTRICTIONS: No  FALLS:  Has patient fallen in last 6 months? 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  PATIENT SURVEYS:  FOTO 58% ability 69% expected in 10 visits   COGNITION: Overall cognitive status: Within functional limits for tasks assessed  SENSATION: Feels a pressure sensation behind her eye at times POSTURE: No Significant postural limitations  PALPATION: Tenderness to palpation in upper trap and into the right cervical paraspinals.  No significant tenderness to palpation in the anterior shoulder  CERVICAL ROM:   Active ROM A/PROM (deg) eval  Flexion 20 degrees with pain   Extension   Right lateral flexion   Left lateral flexion   Right rotation 63 degrees painful   Left rotation 55 degrees no pain    (Blank rows = not tested)  UPPER EXTREMITY ROM:  Upper extremity MMT Right eval Left eval  Shoulder flexion 4+ 4+  Shoulder extension    Shoulder abduction    Shoulder adduction    Shoulder extension    Shoulder internal rotation 5 5  Shoulder external rotation 4+ 4+  Elbow flexion    Elbow  extension        Wrist flexion    Wrist extension    Wrist ulnar deviation    Wrist radial deviation    Wrist pronation    Wrist supination     (Blank rows = not tested) grip right 40 pounds grip left 45 pounds  UPPER EXTREMITY MMT: Full active upper extremity range of motion of the right arm but mild tightness felt with internal rotation.  TREATMENT DATE:                                                                                                                               1/14 Trigger Point Dry Needling  Initial Treatment: Pt instructed on Dry Needling rational, procedures, and possible side effects. Pt instructed to expect mild to moderate muscle soreness later in the day and/or into the next day.  Pt instructed in methods to reduce muscle soreness. Pt instructed to continue prescribed HEP. Because Dry Needling was performed over or adjacent to a lung field, pt was educated on S/S of pneumothorax and to seek immediate medical attention should they occur.  Patient was educated on signs and symptoms of infection and other risk factors and advised to seek medical attention should they occur.  Patient verbalized understanding of these instructions  and education.   Patient Verbal Consent Given: Yes Education Handout Provided: Yes Muscles Treated: Lt upper trap, suboccipitals Electrical Stimulation Performed: No Treatment Response/Outcome: twitch with decreased tension & soreness  Prone gross rib and thoracic mobility First rib mob +upper trap stretch with sheet Thoracic extension with deep breathing Ktape upper trap inhibition Cable standing row & seated lat pull down  1/2 Manual: trigger point release to upper trap and cervical paraspinals; sub-occipital release; gentle annual traction   Bilateral er 2x10 yellow  Bilateral horizontal abduction 2x10 yellow  Bilateral flexion  2x10 yellow band   Levator stretch 3x20 sec hold for the right. Reviewed HEP      Eval:   Access Code: [email or web address] URL: [email or web address] Date: 01/16/2023 Prepared by: Alm Don  Exercises - Seated Upper Trapezius Stretch  - 1 x daily - 7 x weekly - 3 sets - 3 reps - 20 sec hold  hold - Theracane Over Shoulder  - 1 x daily - 7 x weekly - 3 sets - 1-2 min  hold - Scapular Retraction with Resistance  - 1 x daily - 7 x weekly - 3 sets - 10 reps - Shoulder extension with resistance - Neutral  - 1 x daily - 7 x weekly - 3 sets - 10 reps  Manual: trigger point release to upper traps and cervical spine   Reviewed bracing types for carpal tunnel  Patient given handout that demonstrates proper workplace set up she is advised to do her best to make sure her screen is at high-level and her wrists are in the neutral position.  PATIENT EDUCATION:  Education details: HEP, symptom management, posture at work  Person educated: Patient Education method: Programmer, Multimedia, Facilities Manager, Actor cues, Verbal cues, and Handouts Education comprehension: verbalized understanding, returned demonstration, verbal cues required, tactile cues required, and needs further education  HOME EXERCISE PROGRAM: Access Code: [email or web address] URL: [email or web address]  ASSESSMENT:  CLINICAL IMPRESSION: Good tolerance to TPDN with soreness following as expected. Encouraged regular movement throughout the day and walking/cable machines at lunch time.     Patient is a 48 year old female with acute onset of right wrist and right cervical/radicular pain in the right upper extremity.  She has limited cervical mobility into flexion and bilateral rotation.  She has significant spasming of right upper trap.  She had mild tenderness to palpation in her hand.  She has mild grip deficiency but nothing significant.  Her strength is equal on each side with mild limitations in ER and flexion.  She would benefit from skilled therapy for postural exercises secondary to desk job.  We also reviewed  proper bracing for carpal tunnel.  Will address this carpal tunnel exercises and strengthening next visit.  She is advised to try to get the brace and see if that helps.  OBJECTIVE IMPAIRMENTS: decreased activity tolerance, decreased mobility, decreased ROM, decreased strength, impaired flexibility, impaired UE functional use, postural dysfunction, and pain.   ACTIVITY LIMITATIONS: carrying, lifting, sitting, and reach over head  PARTICIPATION LIMITATIONS: meal prep, cleaning, laundry, shopping, occupation, and yard work  PERSONAL FACTORS: 1-2 comorbidities: carpal tunnel and cervicalgia;   are also affecting patient's functional outcome.   REHAB POTENTIAL: Good  CLINICAL DECISION MAKING: Evolving/moderate complexity increasing radicualr symptoms along with multiple body parts   EVALUATION COMPLEXITY: Moderate   GOALS: Goals reviewed with patient? Yes  SHORT TERM GOALS: Target date: 02/13/2023    Patient will increase right grip strength by 5 pounds Baseline:  Goal status: INITIAL  2.  Patient  will increase bilateral cervical rotation by 10 degrees Baseline:  Goal status: INITIAL  3.  Patient will have full right internal rotation without feeling of restriction Baseline:  Goal status: INITIAL  4.  Patient will have basic HEP Baseline:  Goal status: INITIAL   LONG TERM GOALS: Target date: 03/14/2023    Patient will perform work tasks without increased pain Baseline:  Goal status: INITIAL  2.  Patient will have full program to promote good posture at work Baseline:  Goal status: INITIAL  3.  Patient proper workplace set up Baseline:  Goal status: INITIAL    PLAN:  PT FREQUENCY: 1-2x/week  PT DURATION: 8 weeks  PLANNED INTERVENTIONS: 97110-Therapeutic exercises, 97530- Therapeutic activity, [id number]- Neuromuscular re-education, 97535- Self Care, 02859- Manual therapy, [id number]- Gait training, [phone]- Aquatic Therapy, 97014- Electrical stimulation (unattended),  97035- Ultrasound, Patient/Family education, Taping, Dry Needling, DME instructions, Cryotherapy, and Moist heat   PLAN FOR NEXT SESSION: Review Barrick bracing options if patient is gotten bracing.  Consider trigger point dry needling to upper trap and cervical paraspinals.  Continue manual therapy to cervical spine.  Consider seated series of postural exercises.  Progress to gym activities as tolerated.  Patient is a member of the gym.   Shaeley Segall C. Tatelyn Vanhecke PT, DPT 02/03/23 9:36 AM

## 2023-02-03 NOTE — Patient Instructions (Signed)

## 2023-02-04 ENCOUNTER — Encounter: Payer: Self-pay | Admitting: Neurology

## 2023-02-07 ENCOUNTER — Encounter (HOSPITAL_BASED_OUTPATIENT_CLINIC_OR_DEPARTMENT_OTHER): Payer: Self-pay | Admitting: Physical Therapy

## 2023-02-07 ENCOUNTER — Ambulatory Visit (HOSPITAL_BASED_OUTPATIENT_CLINIC_OR_DEPARTMENT_OTHER): Payer: Commercial Managed Care - PPO | Admitting: Physical Therapy

## 2023-02-07 DIAGNOSIS — M542 Cervicalgia: Secondary | ICD-10-CM

## 2023-02-07 DIAGNOSIS — M25531 Pain in right wrist: Secondary | ICD-10-CM

## 2023-02-07 DIAGNOSIS — R293 Abnormal posture: Secondary | ICD-10-CM

## 2023-02-07 NOTE — Therapy (Signed)
OUTPATIENT PHYSICAL THERAPY CERVICAL EVALUATION   Patient Name: Marie Perez MRN: [id number] DOB:1975/09/10, 48 y.o., female Today's Date: 02/07/2023  END OF SESSION:  PT End of Session - 02/07/23 1212     Visit Number 4    Number of Visits 8    Date for PT Re-Evaluation 03/13/23    PT Start Time 1212    PT Stop Time 1246    PT Time Calculation (min) 34 min    Activity Tolerance Patient tolerated treatment well    Behavior During Therapy [facility] for tasks assessed/performed              Past Medical History:  Diagnosis Date   Hyperlipidemia    Hypertension    Vitamin D deficiency    Past Surgical History:  Procedure Laterality Date   CESAREAN SECTION  01/2008   Patient Active Problem List   Diagnosis Date Noted   Musculoskeletal pain 12/24/2010   Sinusitis 05/01/2010   CARPAL TUNNEL SYNDROME, RIGHT 04/05/2010   FATIGUE 02/27/2010   Fatigue 02/27/2010   FREQUENCY, URINARY 11/26/2009   OBESITY, UNSPECIFIED 10/31/2008   Essential hypertension 10/31/2008   UTI 10/31/2008   Vaginitis and vulvovaginitis 10/31/2008   Goiter 01/01/2000    PCP: Abbe Amsterdam MD   REFERRING PROVIDER: Jacquelyne Balint MD   REFERRING DIAG:   THERAPY DIAG:  Cervicalgia  Pain in right wrist  Abnormal posture  Rationale for Evaluation and Treatment: Rehabilitation  ONSET DATE:   SUBJECTIVE:                                                                                                                                                                                                         SUBJECTIVE STATEMENT: Still in the middle. Tape was a helpful reminder.    Eval: Approximately 1 month ago the patient had an acute onset of right wrist and hand pain.  She works at [employer].  Her pain is exacerbated by computer work.  She is right-hand dominant.  About 2 months prior she had onset of neck pain that radiates down to her elbow.  She reports work on the upper trap improved the pain  for short period of time.  She also has a area of pressure behind her eye.  She reports they found some type of fluid behind her eye.  She will further follow-up on this coming up. Hand dominance: Right  PERTINENT HISTORY:  Hypertension, C-section, vitamin D deficiency  PAIN:  Are you having pain? Yes: NPRS scale: 6 10 Pain location: midline lower cervical/upper thoracic  Pain description: Aching Aggravating factors: Looking down at the computer Relieving factors: Rest  PRECAUTIONS: None  RED FLAGS: None     WEIGHT BEARING RESTRICTIONS: No  FALLS:  Has patient fallen in last 6 months? 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  PATIENT SURVEYS:  FOTO 58% ability 69% expected in 10 visits   COGNITION: Overall cognitive status: Within functional limits for tasks assessed  SENSATION: Feels a pressure sensation behind her eye at times POSTURE: No Significant postural limitations  PALPATION: Tenderness to palpation in upper trap and into the right cervical paraspinals.  No significant tenderness to palpation in the anterior shoulder  CERVICAL ROM:   Active ROM A/PROM (deg) eval  Flexion 20 degrees with pain   Extension   Right lateral flexion   Left lateral flexion   Right rotation 63 degrees painful   Left rotation 55 degrees no pain    (Blank rows = not tested)  UPPER EXTREMITY ROM:  Upper extremity MMT Right eval Left eval  Shoulder flexion 4+ 4+  Shoulder extension    Shoulder abduction    Shoulder adduction    Shoulder extension    Shoulder internal rotation 5 5  Shoulder external rotation 4+ 4+  Elbow flexion    Elbow extension        Wrist flexion    Wrist extension    Wrist ulnar  deviation    Wrist radial deviation    Wrist pronation    Wrist supination     (Blank rows = not tested) grip right 40 pounds grip left 45 pounds  UPPER EXTREMITY MMT: Full active upper extremity range of motion of the right arm but mild tightness felt with internal rotation.  TREATMENT DATE:                                                                                                                               1/14 Trigger Point Dry Needling  Subsequent Treatment: Instructions provided previously at initial dry needling treatment.   Patient Verbal Consent Given: Yes Education Handout Provided: Previously Provided Muscles Treated: bilateral upper trap, Rt levator scap Electrical Stimulation Performed: No Treatment Response/Outcome: twitch with decreased spasm  Prone rib mobs, thoracic PAs Seated W red tband- inhale at end range Seated Y red tband with inhale at the top Seated round back + GHJ ER red tband Door pec stretch     PATIENT EDUCATION:  Education details: HEP, symptom management, posture at work  Person educated: Patient Education method: Programmer, multimedia, Facilities manager, Actor cues, Verbal cues, and Handouts Education comprehension: verbalized understanding, returned demonstration, verbal cues required, tactile cues required, and needs further education  HOME EXERCISE  PROGRAM: Access Code: [email or web address] URL: [email or web address]  ASSESSMENT:  CLINICAL IMPRESSION: Improved concordant pain with stretching and exercises today. Replaced tape for reminder. Tendency toward shoulder elevation in inhalation and will try to be aware of rib cage motion.   OBJECTIVE IMPAIRMENTS: decreased activity tolerance, decreased mobility, decreased ROM, decreased strength, impaired flexibility, impaired UE functional use, postural dysfunction, and pain.   ACTIVITY LIMITATIONS: carrying, lifting, sitting, and reach over head  PARTICIPATION LIMITATIONS: meal prep,  cleaning, laundry, shopping, occupation, and yard work  PERSONAL FACTORS: 1-2 comorbidities: carpal tunnel and cervicalgia;   are also affecting patient's functional outcome.   REHAB POTENTIAL: Good  CLINICAL DECISION MAKING: Evolving/moderate complexity increasing radicualr symptoms along with multiple body parts   EVALUATION COMPLEXITY: Moderate   GOALS: Goals reviewed with patient? Yes  SHORT TERM GOALS: Target date: 02/13/2023    Patient will increase right grip strength by 5 pounds Baseline:  Goal status: INITIAL  2.  Patient will increase bilateral cervical rotation by 10 degrees Baseline:  Goal status: INITIAL  3.  Patient will have full right internal rotation without feeling of restriction Baseline:  Goal status: INITIAL  4.  Patient will have basic HEP Baseline:  Goal status: INITIAL   LONG TERM GOALS: Target date: 03/14/2023    Patient will perform work tasks without increased pain Baseline:  Goal status: INITIAL  2.  Patient will have full program to promote good posture at work Baseline:  Goal status: INITIAL  3.  Patient proper workplace set up Baseline:  Goal status: INITIAL    PLAN:  PT FREQUENCY: 1-2x/week  PT DURATION: 8 weeks  PLANNED INTERVENTIONS: 97110-Therapeutic exercises, 97530- Therapeutic activity, [id number]- Neuromuscular re-education, 97535- Self Care, 29528- Manual therapy, [id number]- Gait training, [id number]- Aquatic Therapy, 97014- Electrical stimulation (unattended), 97035- Ultrasound, Patient/Family education, Taping, Dry Needling, DME instructions, Cryotherapy, and Moist heat   PLAN FOR NEXT SESSION: Review [place] bracing options if patient is gotten bracing.  Consider trigger point dry needling to upper trap and cervical paraspinals.  Continue manual therapy to cervical spine.  Consider seated series of postural exercises.  Progress to gym activities as tolerated.  Patient is a member of the gym.   Santanna Whitford C. Maxi Carreras PT,  DPT 02/07/23 12:49 PM

## 2023-02-09 ENCOUNTER — Other Ambulatory Visit (HOSPITAL_BASED_OUTPATIENT_CLINIC_OR_DEPARTMENT_OTHER): Payer: Self-pay

## 2023-02-10 NOTE — Discharge Instructions (Signed)

## 2023-02-11 ENCOUNTER — Encounter: Payer: Self-pay | Admitting: Neurology

## 2023-02-11 ENCOUNTER — Ambulatory Visit
Admission: RE | Admit: 2023-02-11 | Discharge: 2023-02-11 | Disposition: A | Discharge status: Home / Self Care | Payer: Commercial Managed Care - PPO | Source: Ambulatory Visit | Attending: Neurology

## 2023-02-11 VITALS — BP 147/91 | HR 100

## 2023-02-11 DIAGNOSIS — G932 Benign intracranial hypertension: Secondary | ICD-10-CM | POA: Diagnosis not present

## 2023-02-11 DIAGNOSIS — E669 Obesity, unspecified: Secondary | ICD-10-CM | POA: Diagnosis not present

## 2023-02-11 DIAGNOSIS — H539 Unspecified visual disturbance: Secondary | ICD-10-CM | POA: Diagnosis not present

## 2023-02-11 DIAGNOSIS — G4486 Cervicogenic headache: Secondary | ICD-10-CM

## 2023-02-11 DIAGNOSIS — M542 Cervicalgia: Secondary | ICD-10-CM

## 2023-02-11 DIAGNOSIS — G5601 Carpal tunnel syndrome, right upper limb: Secondary | ICD-10-CM

## 2023-02-12 LAB — PROTEIN, CSF: Total Protein, CSF: 52 mg/dL — ABNORMAL HIGH (ref 15–45)

## 2023-02-12 LAB — CSF CELL COUNT WITH DIFFERENTIAL
RBC Count, CSF: 2100 {cells}/uL — ABNORMAL HIGH
TOTAL NUCLEATED CELL: 1 {cells}/uL (ref 0–5)

## 2023-02-12 LAB — GLUCOSE, CSF: Glucose, CSF: 58 mg/dL (ref 40–80)

## 2023-02-17 DIAGNOSIS — F59 Unspecified behavioral syndromes associated with physiological disturbances and physical factors: Secondary | ICD-10-CM | POA: Diagnosis not present

## 2023-02-17 DIAGNOSIS — F509 Eating disorder, unspecified: Secondary | ICD-10-CM | POA: Diagnosis not present

## 2023-02-17 DIAGNOSIS — E669 Obesity, unspecified: Secondary | ICD-10-CM | POA: Diagnosis not present

## 2023-02-17 DIAGNOSIS — F1090 Alcohol use, unspecified, uncomplicated: Secondary | ICD-10-CM | POA: Diagnosis not present

## 2023-02-17 DIAGNOSIS — Z713 Dietary counseling and surveillance: Secondary | ICD-10-CM | POA: Diagnosis not present

## 2023-02-25 ENCOUNTER — Encounter (HOSPITAL_BASED_OUTPATIENT_CLINIC_OR_DEPARTMENT_OTHER): Payer: Self-pay

## 2023-02-25 ENCOUNTER — Ambulatory Visit (HOSPITAL_BASED_OUTPATIENT_CLINIC_OR_DEPARTMENT_OTHER): Payer: Commercial Managed Care - PPO | Admitting: Physical Therapy

## 2023-02-26 ENCOUNTER — Encounter: Payer: Self-pay | Admitting: Family Medicine

## 2023-02-26 ENCOUNTER — Other Ambulatory Visit (HOSPITAL_BASED_OUTPATIENT_CLINIC_OR_DEPARTMENT_OTHER): Payer: Self-pay

## 2023-02-26 ENCOUNTER — Encounter: Payer: Self-pay | Admitting: Neurology

## 2023-02-26 ENCOUNTER — Other Ambulatory Visit: Payer: Self-pay

## 2023-02-26 ENCOUNTER — Ambulatory Visit (INDEPENDENT_AMBULATORY_CARE_PROVIDER_SITE_OTHER): Payer: Commercial Managed Care - PPO | Admitting: Family Medicine

## 2023-02-26 ENCOUNTER — Encounter (HOSPITAL_BASED_OUTPATIENT_CLINIC_OR_DEPARTMENT_OTHER): Payer: Self-pay

## 2023-02-26 VITALS — BP 132/86 | Temp 98.8°F | Ht 65.0 in | Wt 245.4 lb

## 2023-02-26 DIAGNOSIS — E038 Other specified hypothyroidism: Secondary | ICD-10-CM | POA: Diagnosis not present

## 2023-02-26 DIAGNOSIS — Z711 Person with feared health complaint in whom no diagnosis is made: Secondary | ICD-10-CM

## 2023-02-26 DIAGNOSIS — R519 Headache, unspecified: Secondary | ICD-10-CM

## 2023-02-26 DIAGNOSIS — I10 Essential (primary) hypertension: Secondary | ICD-10-CM | POA: Diagnosis not present

## 2023-02-26 DIAGNOSIS — R4589 Other symptoms and signs involving emotional state: Secondary | ICD-10-CM

## 2023-02-26 LAB — POC URINALSYSI DIPSTICK (AUTOMATED)
Bilirubin, UA: NEGATIVE
Blood, UA: NEGATIVE
Glucose, UA: NEGATIVE
Ketones, UA: NEGATIVE
Leukocytes, UA: NEGATIVE
Nitrite, UA: NEGATIVE
Protein, UA: NEGATIVE
Spec Grav, UA: 1.025 (ref 1.010–1.025)
Urobilinogen, UA: 0.2 U/dL
pH, UA: 6 (ref 5.0–8.0)

## 2023-02-26 MED ORDER — AMLODIPINE BESYLATE 5 MG PO TABS
5.0000 mg | ORAL_TABLET | Freq: Every day | ORAL | 3 refills | Status: AC
  Filled 2023-02-26: qty 90, 90d supply, fill #0
  Filled 2023-05-25: qty 90, 90d supply, fill #1
  Filled 2023-08-25: qty 90, 90d supply, fill #2
  Filled 2024-02-13: qty 90, 90d supply, fill #3

## 2023-02-26 NOTE — Progress Notes (Signed)
 Established Patient Office Visit   Subjective  Patient ID: Marie Perez, female    DOB: Feb 20, 1975  Age: 48 y.o. MRN: [id number]  Chief Complaint  Patient presents with   Headache   Vaginal Discharge   Hypertension   Dysuria    Patient is a 48 year old female seen for several concerns.  Patient seen by neurology for headaches.  States was told she had fluid behind her right eye.  Had lumbar puncture that was largely negative.  CT head also negative.  Went to PT for dry needling per chart review for cervicalgia.  Mentions burning sensation in spine.  Patient states she was advised she needed an MRI by several providers.  Has upcoming appointment with eye doctor as headache starting to affect vision.  Wakes up every morning at 3 AM as she is worried something is wrong and she will not wake up in the morning.  Patient mentions vaginal discharge and possible itching 4 days ago.  Was unsure if due to wearing underwear that were not cotton.  Symptoms have since resolved.  LMP 01/30/2023.  Patient notes improvement in BP at home.  135/80 this morning.  Taking valsartan -hydrochlorothiazide  and norvasc  5 mg.  Pt concerned about changes in urine.  Denies frequency or dysuria.  Has a habit of holding urine instead of going to the restroom.  Had 3 bottles of water this morning, but has yet to urinate.  Patient seen in December by Dr. Micheal.  Noted to have subclinical hypothyroidism.    Patient Active Problem List   Diagnosis Date Noted   Musculoskeletal pain 12/24/2010   Sinusitis 05/01/2010   CARPAL TUNNEL SYNDROME, RIGHT 04/05/2010   FATIGUE 02/27/2010   Fatigue 02/27/2010   FREQUENCY, URINARY 11/26/2009   OBESITY, UNSPECIFIED 10/31/2008   Essential hypertension 10/31/2008   UTI 10/31/2008   Vaginitis and vulvovaginitis 10/31/2008   Goiter 01/01/2000   Past Medical History:  Diagnosis Date   Hyperlipidemia    Hypertension    Vitamin D  deficiency    Past Surgical History:   Procedure Laterality Date   CESAREAN SECTION  01/2008   Social History   Tobacco Use   Smoking status: Never   Smokeless tobacco: Never  Vaping Use   Vaping status: Never Used  Substance Use Topics   Alcohol use: No   Drug use: No   Family History  Problem Relation Age of Onset   Hypertension Mother    High Cholesterol Mother    Diabetes Father    Liver disease Neg Hx    Esophageal cancer Neg Hx    Colon cancer Neg Hx    No Known Allergies    ROS Negative unless stated above    Objective:     BP 132/86 (BP Location: Left Arm, Patient Position: Sitting, Cuff Size: Large)   Temp 98.8 F (37.1 C) (Oral)   Ht 5' 5 (1.651 m)   Wt 245 lb 6.4 oz (111.3 kg)   LMP 01/30/2023 (Exact Date)   BMI 40.84 kg/m  BP Readings from Last 3 Encounters:  02/26/23 132/86  02/11/23 (!) 147/91  01/19/23 (!) 144/100   Wt Readings from Last 3 Encounters:  02/26/23 245 lb 6.4 oz (111.3 kg)  01/19/23 255 lb 8 oz (115.9 kg)  01/09/23 255 lb (115.7 kg)      Physical Exam Constitutional:      General: She is not in acute distress.    Appearance: Normal appearance.  HENT:     Head: Normocephalic  and atraumatic.     Nose: Nose normal.     Mouth/Throat:     Mouth: Mucous membranes are moist.  Cardiovascular:     Rate and Rhythm: Normal rate and regular rhythm.     Heart sounds: Normal heart sounds. No murmur heard.    No gallop.  Pulmonary:     Effort: Pulmonary effort is normal. No respiratory distress.     Breath sounds: Normal breath sounds. No wheezing, rhonchi or rales.  Skin:    General: Skin is warm and dry.  Neurological:     Mental Status: She is alert and oriented to person, place, and time.       02/26/2023   12:59 PM 08/20/2022    8:25 AM 01/23/2020    2:27 PM  Depression screen PHQ 2/9  Decreased Interest 1 0 0  Down, Depressed, Hopeless 0 0 0  PHQ - 2 Score 1 0 0  Altered sleeping 2 1   Tired, decreased energy 2 2   Change in appetite 1 1   Feeling bad  or failure about yourself  0 0   Trouble concentrating 0 0   Moving slowly or fidgety/restless 1 0   Suicidal thoughts 0 0   PHQ-9 Score 7 4   Difficult doing work/chores  Not difficult at all       02/26/2023    1:00 PM 08/20/2022    8:26 AM  GAD 7 : Generalized Anxiety Score  Nervous, Anxious, on Edge 0 0  Control/stop worrying 3 0  Worry too much - different things 3 0  Trouble relaxing 1 0  Restless 1 0  Easily annoyed or irritable 0 0  Afraid - awful might happen 3 0  Total GAD 7 Score 11 0  Anxiety Difficulty Somewhat difficult     Results for orders placed or performed in visit on 02/26/23  POCT Urinalysis Dipstick (Automated)  Result Value Ref Range   Color, UA yellow    Clarity, UA clear    Glucose, UA Negative Negative   Bilirubin, UA neg    Ketones, UA neg    Spec Grav, UA 1.025 1.010 - 1.025   Blood, UA neg    pH, UA 6.0 5.0 - 8.0   Protein, UA Negative Negative   Urobilinogen, UA 0.2 0.2 or 1.0 E.U./dL   Nitrite, UA neg    Leukocytes, UA Negative Negative      Assessment & Plan:  Frequent headaches  Essential hypertension -     amLODIPine  Besylate; Take 1 tablet (5 mg total) by mouth daily.  Dispense: 90 tablet; Refill: 3  Anxiety about health  Concern about urinary tract disease without diagnosis -     POCT Urinalysis Dipstick (Automated)  Subclinical hypothyroidism  Patient seen for follow-up on several concerns.  Seen by neurology for headaches.  Status post LP.  Will need time to review chart is having a hard time following several details of story.  Advised on possible causes of headache including idiopathic intracranial hypertension, migraines, tension headaches.  Patient advised to keep appointment with ophthalmology.  BP improving.  Continue lifestyle modifications including diet and exercise.  UA obtained for concerns regarding urine.  UA negative for infection.  Patient advised to increase water intake and avoid holding urine.  Subclinical  hypothyroidism noted at the end of December.  Continue to monitor.  PHQ-9 score 7 and GAD 7 score 11 this visit.  Continue counseling.  Consider medication for continued or worsening symptoms.  On day of service, 41 minutes spent reviewing the chart, counseling and/or coordinating care for plan and treatment of diagnosis below.     Return in about 5 weeks (around 04/02/2023), or if symptoms worsen or fail to improve.   Marie JONELLE Single, MD

## 2023-02-26 NOTE — Patient Instructions (Signed)
 It is recommended that you keep your upcoming appointment with the eye doctor.  I am still going through the recent Neurology visit info as I am still confused about several details.  Continue monitoring your bp.  Your is normal.  You could drink a little more water.  No signs of infection.

## 2023-03-02 NOTE — Progress Notes (Signed)
NEUROLOGY FOLLOW UP OFFICE NOTE  Marie Perez [id number]  Subjective:  Marie Perez is a 48 y.o. year old right-handed female with a medical history of HTN, HLD, pre-DM, vit D deficiency, right carpal tunnel syndrome while pregnant, obesity who we last saw on 01/09/23 for headaches.  To briefly review: 01/09/23: Patient has had symptoms for about 1 year. She feels pressure on the right side of her head, from the front to top of her head. It is a burning sensation that feels like a tube running in her head. She has significant neck pain and tightness. She denies photophobia, phonophobia, nausea, or vomiting. She was having symptoms 2-3 times per month but has increased to twice a week. She thinks symptoms occur more often on waking and can last until mid day. It can occur during the day though. When she gets a headache, she will sit in a chair. She does not lay down, so is unsure if it changes with position. She does not notice it changing. She endorses blurry vision but not vision loss. This only started when the headaches started. Patient has been to eye doctor this year. Per patient she had a dilated exam and did not see any issues.   One time she had right face and arm (biceps, forearm, into hand) numbness/tingling with the headache (10/2022). It woke her up from sleep and lasted about 2 hours. She went to the ED. Everything checked out, but she did not have any brain imaging per patient (done at [facility] which I cannot see records). While pregnancy, she was told she had right carpal tunnel syndrome. She mentions that sometimes she feels like she is talking slow as well.   She had a CT of head and maxillofacial on 11/27/22 that was normal.   She has tried tylenol or ibuprofen which may knock the edge off, but does not get rid of the headache. BC headache powder will help, but tries not to take it often. She will take something 2-3 times per week. She has never had PT or had any headache  medications.   Regarding sleep, she usually sleeps well, but headaches can keep her up. She is told she snores. She does not gasp for air. She sometimes feels not well rested when waking up. She feels tired throughout the day. She has never had a sleep study.   Regarding mood, she endorses some occasional anxiety, but otherwise denies depressive symptoms.   Caffeine use: 2 [employer] a week Non-smoker EtOH use: wine once a month Restrictive diet: no Family history of neurologic disease, including headaches: no   Most recent Assessment and Plan (01/09/23): Marie Perez is a 48 y.o. female who presents for evaluation of right sided headache and neck pain and numbness and tingling in right arm. She has a relevant medical history of HTN, HLD, pre-DM, vit D deficiency, right carpal tunnel syndrome while pregnant, obesity. Her neurological examination is pertinent for possible blurring of optic discs bilaterally and positive Phalen and Tinel's test at right carpal tunnel. Available diagnostic data is significant for CT head that may have a partially empty sella per my read. The etiology of patient's headache is currently unclear, but it is possible these are cervicogenic headaches given her neck pain and tightness. There is possible papilledema on non-dilated fundoscopy and partially empty sella on CT head that also raises the question of IIH, particularly given reported vision changes. I will get an LP to evaluate for this and send  patient to PT for cervicalgia. Her right arm symptoms are consistent with carpal tunnel syndrome, which she has a known history of in the past.   PLAN: -Blood work: B12, TSH -LP with opening pressure and routine analysis (protein, glucose, cell count) -PT for neck pain -Wrist splint for probable CTS. Will consider EMG if this does not help  Since their last visit: Labs showed TSH of 0.04 (low) and normal B12 (435). Repeat TSH on 01/19/23 was 0.025 (T3 and T4  normal).  LP on 02/10/22 showed an opening pressure of 14 cm of H2O (normal). Routine analysis of CSF was unremarkable.  Patient is still having burning in her head and neck pain. Patient was going to PT and got dry needling. She took a break after the LP and is currently on the wait list. This helped with neck pain, but she still had the burning in the head.  She continues to sleep poorly with headaches more at night and in the morning.  MEDICATIONS:  Outpatient Encounter Medications as of 03/05/2023  Medication Sig Note   amLODipine (NORVASC) 5 MG tablet Take 1 tablet (5 mg total) by mouth daily.    atorvastatin (LIPITOR) 20 MG tablet Take 1 tablet (20 mg total) by mouth daily.    cetirizine (ZYRTEC) 10 MG tablet Take 1 tablet by mouth every day    esomeprazole (NEXIUM) 40 MG capsule Take 1 capsule (40 mg total) by mouth 2 (two) times daily before a meal.    fluticasone (FLONASE) 50 MCG/ACT nasal spray Place 2 sprays into both nostrils daily. (Patient taking differently: Place 2 sprays into both nostrils as needed.)    linaclotide (LINZESS) 290 MCG CAPS capsule Take 1 capsule by mouth on an empty stoamch at least 30 minutes before the first meal of the day    nortriptyline (PAMELOR) 10 MG capsule Take 2 capsules (20 mg total) by mouth at bedtime. Take 1 capsule (10 mg) at bedtime for 1 week, then increase to 2 capsules (20 mg) at bedtime thereafter    tirzepatide (MOUNJARO) 7.5 MG/0.5ML Pen Inject 7.5 mg into the skin once a week.    valsartan-hydrochlorothiazide (DIOVAN-HCT) 320-12.5 MG tablet Take 1 tablet by mouth daily.    Vitamin D, Cholecalciferol, 25 MCG (1000 UT) CAPS Take by mouth.    montelukast (SINGULAIR) 10 MG tablet Take 1 tablet (10 mg total) by mouth daily. (Patient taking differently: Take 10 mg by mouth as needed.)    tirzepatide (MOUNJARO) 2.5 MG/0.5ML Pen Inject 2.5 mg into the skin once a week. (Patient not taking: Reported on 03/05/2023)    tirzepatide [facility]) 5  MG/0.5ML Pen Inject 5 mg into the skin once a week. (Patient not taking: Reported on 03/05/2023)    tirzepatide [facility]) 7.5 MG/0.5ML Pen Inject 7.5 mg into the skin once a week. (Patient not taking: Reported on 03/05/2023)    Vitamin D, Ergocalciferol, 50000 units CAPS Take 1 capsule by mouth once a week. (Patient not taking: Reported on 03/05/2023)    [DISCONTINUED] Phentermine-Topiramate (QSYMIA) 3.75-23 MG CP24 Take 1 tablet by mouth daily for two weeks, then take 2 tablets by mouth daily 05/03/2022: PA required/insurance won't cover and cash price is too high   No facility-administered encounter medications on file as of 03/05/2023.    PAST MEDICAL HISTORY: Past Medical History:  Diagnosis Date   Hyperlipidemia    Hypertension    Vitamin D deficiency     PAST SURGICAL HISTORY: Past Surgical History:  Procedure Laterality Date  CESAREAN SECTION  01/2008    ALLERGIES: No Known Allergies  FAMILY HISTORY: Family History  Problem Relation Age of Onset   Hypertension Mother    High Cholesterol Mother    Diabetes Father    Liver disease Neg Hx    Esophageal cancer Neg Hx    Colon cancer Neg Hx     SOCIAL HISTORY: Social History   Tobacco Use   Smoking status: Never   Smokeless tobacco: Never  Vaping Use   Vaping status: Never Used  Substance Use Topics   Alcohol use: No   Drug use: No   Social History   Social History Narrative   Are you right handed or left handed? Right Handed   Are you currently employed ? 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 No   Who lives with you? Husband   What type of home do you live in: 1 story or 2 story? Lives in a one story home          Objective:  Vital Signs:  BP 116/79   Pulse 90   Ht 5\' 5"  (1.651 m)   Wt 243 lb (110.2 kg)   LMP 01/30/2023 (Exact Date)   SpO2 98%   BMI 40.44 kg/m   General: No acute distress.  Patient appears well-groomed.    Head:  Normocephalic/atraumatic Eyes:  Fundi examined, disc margins clear, no obvious papilledema Neck: supple, reduced range of motion, cervical paraspinal tenderness Lungs: Non-labored breathing on room air  Neurological Exam: alert and oriented.  Speech fluent and not dysarthric, language intact.  CN II-XII intact. Bulk and tone normal, muscle strength 5/5 throughout.  Sensation to light touch intact.  Deep tendon reflexes 2+ throughout.  Finger to nose testing intact.  Gait normal   Labs and Imaging review: New results: Lumbar puncture (02/11/23): -Opening pressure 14 cm of H2O -Routine analysis: 2100 RBC, 1 WBC, 52 protein, 58 glucose  01/19/23:  TSH 0.025 Free T4, T3 wnl  B12 (01/09/23): 435  Previously reviewed results: BMP (11/25/22): significant for Cr 1.07 ESR (04/30/20) wnl CBC (04/10/20) unremarkable HbA1c (01/31/20): 6.3 Lipid panel (01/23/20): tChol 194, LDL 129, TG 84   CT head wo contrast (11/27/22): Per my read: there appears to be a partially empty sella.   Radiology read: FINDINGS: Brain: No evidence of acute infarction, hemorrhage, hydrocephalus, extra-axial collection or mass lesion/mass effect.   Vascular: No hyperdense vessel or unexpected calcification.   Skull: Normal. Negative for fracture or focal lesion.   Sinuses/Orbits: No acute finding.   IMPRESSION: No acute intracranial process.   CT maxillofacial wo contrast (11/27/22): FINDINGS: Osseous: No fracture or mandibular dislocation. No destructive process.   Orbits: Negative. No traumatic or inflammatory finding.   Sinuses: Clear.   Soft tissues: Negative.   Limited intracranial: No significant or unexpected finding.   IMPRESSION: Unremarkable examination.  Assessment/Plan:  This is [name], a 48 y.o. female with burning pain in her head and neck. She does not call the symptoms "headaches" but there are headache like symptoms of burning and pressure. Given that symptoms were  worse at night when laying down, I considered IIH. Her LP showed normal opening pressure and CSF analysis though. Nocturnal headaches or worse pain in the morning could be the result of undiagnosed OSA though, for which she has several signs. She has seen some improvement of neck pain with PT, but not  in head pain. I will start low dose nortriptyline for this.   Plan: -Start Nortriptyline 10 mg at bedtime for 1 week then increase to 20 mg at bedtime  -Sleep medicine referral for possible OSA -Continue physical therapy  Return to clinic as planned on 05/14/22  Total time spent reviewing records, interview, history/exam, documentation, and coordination of care on day of encounter:  40 min  Jacquelyne Balint, MD

## 2023-03-03 ENCOUNTER — Other Ambulatory Visit (HOSPITAL_BASED_OUTPATIENT_CLINIC_OR_DEPARTMENT_OTHER): Payer: Self-pay

## 2023-03-03 DIAGNOSIS — F1090 Alcohol use, unspecified, uncomplicated: Secondary | ICD-10-CM | POA: Diagnosis not present

## 2023-03-03 DIAGNOSIS — F509 Eating disorder, unspecified: Secondary | ICD-10-CM | POA: Diagnosis not present

## 2023-03-03 DIAGNOSIS — F59 Unspecified behavioral syndromes associated with physiological disturbances and physical factors: Secondary | ICD-10-CM | POA: Diagnosis not present

## 2023-03-03 DIAGNOSIS — Z713 Dietary counseling and surveillance: Secondary | ICD-10-CM | POA: Diagnosis not present

## 2023-03-03 DIAGNOSIS — E669 Obesity, unspecified: Secondary | ICD-10-CM | POA: Diagnosis not present

## 2023-03-03 MED ORDER — MOUNJARO 7.5 MG/0.5ML ~~LOC~~ SOAJ
7.5000 mg | SUBCUTANEOUS | 1 refills | Status: DC
  Filled 2023-03-03 – 2023-04-01 (×2): qty 2, 28d supply, fill #0
  Filled 2023-04-30: qty 2, 28d supply, fill #1

## 2023-03-05 ENCOUNTER — Encounter: Payer: Self-pay | Admitting: Neurology

## 2023-03-05 ENCOUNTER — Other Ambulatory Visit (HOSPITAL_BASED_OUTPATIENT_CLINIC_OR_DEPARTMENT_OTHER): Payer: Self-pay

## 2023-03-05 ENCOUNTER — Ambulatory Visit: Payer: Commercial Managed Care - PPO | Admitting: Neurology

## 2023-03-05 VITALS — BP 116/79 | HR 90 | Ht 65.0 in | Wt 243.0 lb

## 2023-03-05 DIAGNOSIS — Z72820 Sleep deprivation: Secondary | ICD-10-CM

## 2023-03-05 DIAGNOSIS — R0683 Snoring: Secondary | ICD-10-CM | POA: Diagnosis not present

## 2023-03-05 DIAGNOSIS — H539 Unspecified visual disturbance: Secondary | ICD-10-CM | POA: Diagnosis not present

## 2023-03-05 DIAGNOSIS — G4486 Cervicogenic headache: Secondary | ICD-10-CM | POA: Diagnosis not present

## 2023-03-05 DIAGNOSIS — R400 Somnolence: Secondary | ICD-10-CM

## 2023-03-05 DIAGNOSIS — G5601 Carpal tunnel syndrome, right upper limb: Secondary | ICD-10-CM | POA: Diagnosis not present

## 2023-03-05 DIAGNOSIS — R202 Paresthesia of skin: Secondary | ICD-10-CM | POA: Diagnosis not present

## 2023-03-05 DIAGNOSIS — M542 Cervicalgia: Secondary | ICD-10-CM | POA: Diagnosis not present

## 2023-03-05 MED ORDER — NORTRIPTYLINE HCL 10 MG PO CAPS
20.0000 mg | ORAL_CAPSULE | Freq: Every day | ORAL | 3 refills | Status: DC
  Filled 2023-03-05: qty 60, 30d supply, fill #0

## 2023-03-05 NOTE — Patient Instructions (Signed)
-  Start Nortriptyline 10 mg at bedtime for 1 week then increase to 20 mg at bedtime thereafter -Sleep medicine referral for possible OSA. Let me know if you do not hear from someone to schedule in 1-2 weeks. -Continue physical therapy  Return to clinic as planned on 05/14/22  Please let me know if you have any questions or concerns in the meantime.  The physicians and staff at [facility] are committed to providing excellent care. You may receive a survey requesting feedback about your experience at our office. We strive to receive "very good" responses to the survey questions. If you feel that your experience would prevent you from giving the office a "very good " response, please contact our office to try to remedy the situation. We may be reached at [phone]. Thank you for taking the time out of your busy day to complete the survey.  Jacquelyne Balint, MD [facility]

## 2023-03-06 DIAGNOSIS — H524 Presbyopia: Secondary | ICD-10-CM | POA: Diagnosis not present

## 2023-03-06 DIAGNOSIS — H52223 Regular astigmatism, bilateral: Secondary | ICD-10-CM | POA: Diagnosis not present

## 2023-03-07 ENCOUNTER — Other Ambulatory Visit (HOSPITAL_BASED_OUTPATIENT_CLINIC_OR_DEPARTMENT_OTHER): Payer: Self-pay

## 2023-03-07 DIAGNOSIS — H6991 Unspecified Eustachian tube disorder, right ear: Secondary | ICD-10-CM | POA: Diagnosis not present

## 2023-03-07 DIAGNOSIS — G43919 Migraine, unspecified, intractable, without status migrainosus: Secondary | ICD-10-CM | POA: Diagnosis not present

## 2023-03-07 MED ORDER — HYDROXYZINE HCL 10 MG PO TABS
10.0000 mg | ORAL_TABLET | Freq: Three times a day (TID) | ORAL | 0 refills | Status: DC | PRN
  Filled 2023-03-07: qty 30, 10d supply, fill #0

## 2023-03-07 MED ORDER — PREDNISONE 20 MG PO TABS
40.0000 mg | ORAL_TABLET | Freq: Every day | ORAL | 0 refills | Status: AC
  Filled 2023-03-07: qty 10, 5d supply, fill #0

## 2023-03-07 MED ORDER — AZELASTINE HCL 137 MCG/SPRAY NA SOLN
1.0000 | Freq: Two times a day (BID) | NASAL | 3 refills | Status: DC
  Filled 2023-03-07: qty 30, 30d supply, fill #0

## 2023-03-07 MED ORDER — OXYMETAZOLINE HCL 0.05 % NA SOLN
2.0000 | Freq: Two times a day (BID) | NASAL | 0 refills | Status: DC
  Filled 2023-03-07: qty 30, 75d supply, fill #0

## 2023-03-13 ENCOUNTER — Encounter: Payer: Self-pay | Admitting: Family Medicine

## 2023-03-13 ENCOUNTER — Encounter: Payer: Self-pay | Admitting: Neurology

## 2023-03-26 NOTE — Telephone Encounter (Signed)
 Please note pt's TSH level/ subclinical hypothyroidism was addressed at appt in February.  There is nothing for patient to actively do to address/correct the TSH level time.  It should be actively monitored and if patient develops symptoms medication can be considered.  In regards to response times please be aware that your message does not come straight to this provider and can be delayed in getting to your provider.  Fully agree with TOC.

## 2023-03-26 NOTE — Telephone Encounter (Signed)
 Pt called in flustered and disappointed asking to speak with the manager. Pt stated that it shouldn't take the Dr a mth before responding back to a MyChart msg concerning pt care. This is the second time that this happened to her where she reached out to the Dr and got no response. Pt Neurologist also had concerns on why the Dr is taking so long to respond.   Pt stated that it took forever for her to get a NP appt and now she is questioning if it was worth the wait. She also referred other people to Dr and now she is wanting to tell them to not go.   Pt decided to cancel her upcoming appt with Dr and transfer her care to [facility] because she came highly recommended.   FYI

## 2023-03-27 ENCOUNTER — Other Ambulatory Visit: Payer: Self-pay | Admitting: Family Medicine

## 2023-03-27 ENCOUNTER — Encounter: Payer: Self-pay | Admitting: Family Medicine

## 2023-03-27 ENCOUNTER — Ambulatory Visit: Admitting: Family Medicine

## 2023-03-27 VITALS — BP 118/78 | HR 98 | Temp 98.0°F | Ht 65.0 in | Wt 239.2 lb

## 2023-03-27 DIAGNOSIS — R7989 Other specified abnormal findings of blood chemistry: Secondary | ICD-10-CM

## 2023-03-27 DIAGNOSIS — K5909 Other constipation: Secondary | ICD-10-CM | POA: Diagnosis not present

## 2023-03-27 DIAGNOSIS — Z6839 Body mass index (BMI) 39.0-39.9, adult: Secondary | ICD-10-CM | POA: Diagnosis not present

## 2023-03-27 DIAGNOSIS — I10 Essential (primary) hypertension: Secondary | ICD-10-CM | POA: Diagnosis not present

## 2023-03-27 DIAGNOSIS — J302 Other seasonal allergic rhinitis: Secondary | ICD-10-CM

## 2023-03-27 DIAGNOSIS — E559 Vitamin D deficiency, unspecified: Secondary | ICD-10-CM | POA: Diagnosis not present

## 2023-03-27 DIAGNOSIS — E66812 Obesity, class 2: Secondary | ICD-10-CM | POA: Diagnosis not present

## 2023-03-27 DIAGNOSIS — Z7689 Persons encountering health services in other specified circumstances: Secondary | ICD-10-CM

## 2023-03-27 DIAGNOSIS — E785 Hyperlipidemia, unspecified: Secondary | ICD-10-CM

## 2023-03-27 NOTE — Assessment & Plan Note (Addendum)
 Stable. Continue with Atorvastatin 20mg  daily. Ordered CMP to liver function and lipid panel.

## 2023-03-27 NOTE — Assessment & Plan Note (Signed)
 Stable. Continue with Linaclotide 290mg  daily.

## 2023-03-27 NOTE — Progress Notes (Signed)
 New Patient Office Visit  Subjective   Patient ID: Marie Perez, female    DOB: 01-17-1976  Age: 48 y.o. MRN: [id number]  CC:  Chief Complaint  Patient presents with   Medical Management of Chronic Issues    Pt would like to discuss on TSH. Pt reports she had endoscopy last year. Feels something keep getting stuck when eat or drink.     HPI Marie Perez presents to establish care with new provider.   Patients previous primary care provider was Dr. Abbe Amsterdam. Last seen 02/06.   Specialist: [facility] with Dr. Jacquelyne Balint.  [facility] with Dr. Nena Jordan. Cousins  [facility]-Dr. Coralee Pesa   HTN: Chronic. Patient is taking Amlodipine 5mg  daily and Valsartan/HTCZ 320-12.5mg  daily. She reports she monitors her blood pressure at home. Usually 128-130/86. Denies CP, dizziness, lightheadedness. Has intermittent lower extremity edema, left lower extremity is worse. Has SHOB since February 2025; has headaches-pressures, but seeing neurology.  BP Readings from Last 3 Encounters:  03/27/23 118/78  03/05/23 116/79  02/26/23 132/86    Hyperlipidemia: Chronic. Patient is taking Atorvastatin 20mg  daily. No complications with taking medication.  Lab Results  Component Value Date   CHOL 194 01/23/2020   HDL 50 01/23/2020   LDLCALC 129 (H) 01/23/2020   TRIG 84 01/23/2020   CHOLHDL 3.9 01/23/2020    Seasonal allergies: Patient is taking Azelastine nasal spray, Cetrizine 10mg  daily as needed, Flonase spray as needed, Montelukast 10mg  daily as needed,  Chronic constipation: Chronic. Patient is taking Linaclotide 290mg  daily.   Vitamin D deficiency: Patient is taking Vitamin D 50,000IU weekly.   Patient reports she had a low TSH back on January 09, 2023 with Dr. Loleta Chance, neurology. He referred her back to PCP to manage. Patients TSH was re-elevated on December 30, with another low level. Free T3 and T4 were normal and Anti-TPO Ab was normal. Suspect it was  subclinical hyperthyroidism. She was to follow up in 3-4 months, which is here with her concerns.   Patient reports has an appointment with [facility], Bayley McMichael on 04/14/2023 for feeling like her food and liquid is getting stuck in her esophagus. She reports she had an endoscopy and was told they stretched her esophagus a little. Also, feels like she needs to cough some times and concerned it the cough could be from post nasal drip. Then, recently she had an irration at her left ear.   Outpatient Encounter Medications as of 03/27/2023  Medication Sig   amLODipine (NORVASC) 5 MG tablet Take 1 tablet (5 mg total) by mouth daily.   atorvastatin (LIPITOR) 20 MG tablet Take 1 tablet (20 mg total) by mouth daily.   Azelastine HCl 137 MCG/SPRAY SOLN Place 1 spray into both nostrils 2 (two) times daily. (Patient taking differently: Place 1 spray into both nostrils 2 (two) times daily as needed.)   cetirizine (ZYRTEC) 10 MG tablet Take 1 tablet by mouth every day   esomeprazole (NEXIUM) 40 MG capsule Take 1 capsule (40 mg total) by mouth 2 (two) times daily before a meal.   fluticasone (FLONASE) 50 MCG/ACT nasal spray Place 2 sprays into both nostrils daily. (Patient taking differently: Place 2 sprays into both nostrils as needed.)   linaclotide (LINZESS) 290 MCG CAPS capsule Take 1 capsule by mouth on an empty stoamch at least 30 minutes before the first meal of the day   montelukast (SINGULAIR) 10 MG tablet Take 1 tablet (10 mg total) by mouth  daily. (Patient taking differently: Take 10 mg by mouth as needed.)   nortriptyline (PAMELOR) 10 MG capsule Take 2 capsules (20 mg total) by mouth at bedtime. Take 1 capsule (10 mg) at bedtime for 1 week, then increase to 2 capsules (20 mg) at bedtime thereafter   tirzepatide (MOUNJARO) 7.5 MG/0.5ML Pen Inject 7.5 mg into the skin once a week.   valsartan-hydrochlorothiazide (DIOVAN-HCT) 320-12.5 MG tablet Take 1 tablet by mouth daily.   Vitamin D,  Ergocalciferol, 50000 units CAPS Take 1 capsule by mouth once a week.   [DISCONTINUED] hydrOXYzine (ATARAX) 10 MG tablet Take 1 tablet (10 mg total) by mouth 3 (three) times daily as needed for allergies.   [DISCONTINUED] oxymetazoline (AFRIN NASAL SPRAY) 0.05 % nasal spray Place 2 sprays into both nostrils 2 (two) times daily. (Patient taking differently: Place 2 sprays into both nostrils 2 (two) times daily as needed.)   [DISCONTINUED] Phentermine-Topiramate (QSYMIA) 3.75-23 MG CP24 Take 1 tablet by mouth daily for two weeks, then take 2 tablets by mouth daily   [DISCONTINUED] tirzepatide (MOUNJARO) 2.5 MG/0.5ML Pen Inject 2.5 mg into the skin once a week. (Patient not taking: Reported on 03/27/2023)   [DISCONTINUED] tirzepatide [facility]) 5 MG/0.5ML Pen Inject 5 mg into the skin once a week. (Patient not taking: Reported on 03/27/2023)   [DISCONTINUED] tirzepatide (MOUNJARO) 7.5 MG/0.5ML Pen Inject 7.5 mg into the skin once a week. (Patient not taking: Reported on 03/27/2023)   [DISCONTINUED] Vitamin D, Cholecalciferol, 25 MCG (1000 UT) CAPS Take by mouth. (Patient not taking: Reported on 03/27/2023)   No facility-administered encounter medications on file as of 03/27/2023.    Past Medical History:  Diagnosis Date   Hyperlipidemia    Hypertension    Vitamin D deficiency     Past Surgical History:  Procedure Laterality Date   CESAREAN SECTION  01/21/2008   COLONOSCOPY     UPPER GI ENDOSCOPY      Family History  Problem Relation Age of Onset   Hypertension Mother    High Cholesterol Mother    Diabetes Father    Liver disease Neg Hx    Esophageal cancer Neg Hx    Colon cancer Neg Hx     Social History   Socioeconomic History   Marital status: Married    Spouse name: Not on file   Number of children: 1   Years of education: Not on file   Highest education level: Associate degree: occupational, Scientist, product/process development, or vocational program  Occupational History   Occupation: Archivist  Tobacco  Use   Smoking status: Never   Smokeless tobacco: Never  Vaping Use   Vaping status: Never Used  Substance and Sexual Activity   Alcohol use: No   Drug use: No   Sexual activity: Yes    Birth control/protection: None  Other Topics Concern   Not on file  Social History Narrative   Are you right handed or left handed? Right Handed   Are you currently employed ? 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 No   Who lives with you? Husband   What type of home do you live in: 1 story or 2 story? Lives in a one story home       Social Drivers of Health   Financial Resource Strain: Low Risk  (01/15/2023)   Overall Financial Resource Strain (CARDIA)    Difficulty of Paying Living Expenses: Not  hard at all  Food Insecurity: No Food Insecurity (01/15/2023)   Hunger Vital Sign    Worried About Running Out of Food in the Last Year: Never true    Ran Out of Food in the Last Year: Never true  Transportation Needs: No Transportation Needs (01/15/2023)   PRAPARE - Administrator, Civil Service (Medical): No    Lack of Transportation (Non-Medical): No  Physical Activity: Insufficiently Active (01/15/2023)   Exercise Vital Sign    Days of Exercise per Week: 2 days    Minutes of Exercise per Session: 10 min  Stress: No Stress Concern Present (01/15/2023)   [employer] of Occupational Health - Occupational Stress Questionnaire    Feeling of Stress : Not at all  Social Connections: Moderately Integrated (01/15/2023)   Social Connection and Isolation Panel [NHANES]    Frequency of Communication with Friends and Family: Twice a week    Frequency of Social Gatherings with Friends and Family: Once a week    Attends Religious Services: More than 4 times per year    Active Member of [employer] or Organizations: No    Attends Banker Meetings: Not on file    Marital Status: Married  Catering manager  Violence: Not At Risk (03/27/2023)   Humiliation, Afraid, Rape, and Kick questionnaire    Fear of Current or Ex-Partner: No    Emotionally Abused: No    Physically Abused: No    Sexually Abused: No    ROS See HPI above    Objective  BP 118/78 (BP Location: Left Arm, Patient Position: Sitting, Cuff Size: Large)   Pulse 98   Temp 98 F (36.7 C) (Oral)   Ht 5\' 5"  (1.651 m)   Wt 239 lb 3.2 oz (108.5 kg)   LMP 03/02/2023 (Exact Date)   SpO2 98%   BMI 39.80 kg/m   Physical Exam Vitals reviewed.  Constitutional:      General: She is not in acute distress.    Appearance: Normal appearance. She is not ill-appearing, toxic-appearing or diaphoretic.  HENT:     Head: Normocephalic and atraumatic.     Right Ear: Ear canal and external ear normal. There is no impacted cerumen.     Left Ear: Ear canal and external ear normal. There is no impacted cerumen.     Ears:     Comments: Mild amount of clear fluid behind TM.     Mouth/Throat:     Mouth: Mucous membranes are moist.     Pharynx: Oropharynx is clear. Uvula midline. No pharyngeal swelling, oropharyngeal exudate, posterior oropharyngeal erythema or uvula swelling.  Eyes:     General:        Right eye: No discharge.        Left eye: No discharge.     Conjunctiva/sclera: Conjunctivae normal.  Cardiovascular:     Rate and Rhythm: Normal rate and regular rhythm.     Heart sounds: Normal heart sounds. No murmur heard.    No friction rub. No gallop.  Pulmonary:     Effort: Pulmonary effort is normal. No respiratory distress.     Breath sounds: Normal breath sounds.  Musculoskeletal:        General: Normal range of motion.  Skin:    General: Skin is warm and dry.  Neurological:     General: No focal deficit present.     Mental Status: She is alert and oriented to person, place, and time. Mental status is at baseline.  Psychiatric:        Mood and Affect: Mood normal.        Behavior: Behavior normal.        Thought Content:  Thought content normal.        Judgment: Judgment normal.      Assessment & Plan:  Essential hypertension Assessment & Plan: Stable. Continue Amlodipine 5mg  and Valsartan/HCTZ 320-12.5mg  daily. Ordered CMP to assess kidney function and potassium level.   Orders: -     Comprehensive metabolic panel; Future  Hyperlipidemia, unspecified hyperlipidemia type Assessment & Plan: Stable. Continue with Atorvastatin 20mg  daily. Ordered CMP to liver function and lipid panel.   Orders: -     Comprehensive metabolic panel; Future -     Lipid panel; Future  Seasonal allergies Assessment & Plan: Stable. Continue Azelastine nasal spray, Cetrizine as needed, Flonase Spray as needed, and Montelukast 10mg  daily as needed. Recommend to use the Flonase to help with drainage of the clear fluid behind TM.   Low TSH level -     T3, free; Future -     T4, free; Future -     TSH; Future  Chronic constipation Assessment & Plan: Stable. Continue with Linaclotide 290mg  daily.    Vitamin D deficiency Assessment & Plan: Ordered vitamin D lab. She still taking supplement.   Orders: -     VITAMIN D 25 Hydroxy (Vit-D Deficiency, Fractures); Future  Class 2 severe obesity due to excess calories with serious comorbidity and body mass index (BMI) of 39.0 to 39.9 in adult (HCC) -     CBC with Differential/Platelet; Future -     Comprehensive metabolic panel; Future -     Hemoglobin A1c; Future  Encounter to establish care   1.Review health maintenance: -Cervical cancer screening: 04/2022; request records  -Covid booster: Declines -Tdap vaccine: [facility] [employer] 2023  -HIV-request records from GYN  2.Ordered TSH with Free T3 and T4 for previous low TSH. If TSH is still low, will recommend patient be evaluated by endocrinology. If within normal limits, will continue to monitor periodically.  3.Ordered labs (CBC, CMP, and A1c) based on obesity.  4.Recommend to follow up as scheduled with GI for  concerns of food and liquid getting stuck in esophagus.  Return in about 3 months (around 06/27/2023) for chronic management.   Zandra Abts, NP

## 2023-03-27 NOTE — Patient Instructions (Addendum)
-  It was nice to meet you and look forward to taking care of you. -Recommend to use your Flonase to help with some of the clear fluid behind your tympanic membrane.  -Ordered labs. Office will call with lab results and you will see them on MyChart.  -Continue all prescribed medications.  -Recommend to follow up as scheduled with GI for concerns of food and liquid getting stuck in esophagus.  -Ordered TSH with Free T3 and T4 for previous low TSH. If TSH is still low, will recommend to be evaluated by endocrinology. If within normal limits, will continue to monitor periodically.  -Follow up in 3 months for chronic management.

## 2023-03-27 NOTE — Assessment & Plan Note (Signed)
 Ordered vitamin D lab. She still taking supplement.

## 2023-03-27 NOTE — Assessment & Plan Note (Signed)
 Stable. Continue Azelastine nasal spray, Cetrizine as needed, Flonase Spray as needed, and Montelukast 10mg  daily as needed. Recommend to use the Flonase to help with drainage of the clear fluid behind TM.

## 2023-03-27 NOTE — Assessment & Plan Note (Signed)
 Stable. Continue Amlodipine 5mg  and Valsartan/HCTZ 320-12.5mg  daily. Ordered CMP to assess kidney function and potassium level.

## 2023-03-28 LAB — CBC WITH DIFFERENTIAL/PLATELET
Basophils Absolute: 0 10*3/uL (ref 0.0–0.2)
Basos: 0 %
EOS (ABSOLUTE): 0 10*3/uL (ref 0.0–0.4)
Eos: 1 %
Hematocrit: 42.3 % (ref 34.0–46.6)
Hemoglobin: 13.6 g/dL (ref 11.1–15.9)
Immature Grans (Abs): 0 10*3/uL (ref 0.0–0.1)
Immature Granulocytes: 0 %
Lymphocytes Absolute: 1.9 10*3/uL (ref 0.7–3.1)
Lymphs: 30 %
MCH: 28.5 pg (ref 26.6–33.0)
MCHC: 32.2 g/dL (ref 31.5–35.7)
MCV: 89 fL (ref 79–97)
Monocytes Absolute: 0.5 10*3/uL (ref 0.1–0.9)
Monocytes: 8 %
Neutrophils Absolute: 4 10*3/uL (ref 1.4–7.0)
Neutrophils: 61 %
Platelets: 290 10*3/uL (ref 150–450)
RBC: 4.78 x10E6/uL (ref 3.77–5.28)
RDW: 13.9 % (ref 11.7–15.4)
WBC: 6.5 10*3/uL (ref 3.4–10.8)

## 2023-03-28 LAB — COMPREHENSIVE METABOLIC PANEL
ALT: 17 IU/L (ref 0–32)
AST: 25 IU/L (ref 0–40)
Albumin: 4.1 g/dL (ref 3.9–4.9)
Alkaline Phosphatase: 67 IU/L (ref 44–121)
BUN/Creatinine Ratio: 8 — ABNORMAL LOW (ref 9–23)
BUN: 9 mg/dL (ref 6–24)
Bilirubin Total: 0.5 mg/dL (ref 0.0–1.2)
CO2: 18 mmol/L — ABNORMAL LOW (ref 20–29)
Calcium: 9.5 mg/dL (ref 8.7–10.2)
Chloride: 104 mmol/L (ref 96–106)
Creatinine, Ser: 1.09 mg/dL — ABNORMAL HIGH (ref 0.57–1.00)
Globulin, Total: 2.8 g/dL (ref 1.5–4.5)
Glucose: 91 mg/dL (ref 70–99)
Potassium: 4 mmol/L (ref 3.5–5.2)
Sodium: 137 mmol/L (ref 134–144)
Total Protein: 6.9 g/dL (ref 6.0–8.5)
eGFR: 63 mL/min/{1.73_m2} (ref >59)

## 2023-03-28 LAB — TSH: TSH: <0.005 u[IU]/mL — ABNORMAL LOW (ref 0.450–4.500)

## 2023-03-28 LAB — T3, FREE: T3, Free: 7.1 pg/mL — ABNORMAL HIGH (ref 2.0–4.4)

## 2023-03-28 LAB — LIPID PANEL
Chol/HDL Ratio: 3.7 ratio (ref 0.0–4.4)
Cholesterol, Total: 131 mg/dL (ref 100–199)
HDL: 35 mg/dL — ABNORMAL LOW (ref >39)
LDL Chol Calc (NIH): 82 mg/dL (ref 0–99)
Triglycerides: 66 mg/dL (ref 0–149)
VLDL Cholesterol Cal: 14 mg/dL (ref 5–40)

## 2023-03-28 LAB — HEMOGLOBIN A1C
Est. average glucose Bld gHb Est-mCnc: 126 mg/dL
Hgb A1c MFr Bld: 6 % — ABNORMAL HIGH (ref 4.8–5.6)

## 2023-03-28 LAB — T4, FREE: Free T4: 2.66 ng/dL — ABNORMAL HIGH (ref 0.82–1.77)

## 2023-03-28 LAB — VITAMIN D 25 HYDROXY (VIT D DEFICIENCY, FRACTURES): Vit D, 25-Hydroxy: 52.6 ng/mL (ref 30.0–100.0)

## 2023-03-30 ENCOUNTER — Encounter: Payer: Self-pay | Admitting: Family Medicine

## 2023-03-30 DIAGNOSIS — N289 Disorder of kidney and ureter, unspecified: Secondary | ICD-10-CM

## 2023-03-30 DIAGNOSIS — E0590 Thyrotoxicosis, unspecified without thyrotoxic crisis or storm: Secondary | ICD-10-CM

## 2023-03-30 NOTE — Addendum Note (Signed)
 Addended by: Kenna Gilbert B on: 03/30/2023 02:04 PM   Modules accepted: Orders

## 2023-03-30 NOTE — Addendum Note (Signed)
 Addended by: Kenna Gilbert B on: 03/30/2023 02:02 PM   Modules accepted: Orders

## 2023-03-31 NOTE — Progress Notes (Unsigned)
 NEUROLOGY FOLLOW UP OFFICE NOTE  Marie Perez [id number]  Subjective:  Marie Perez is a 48 y.o. year old right-handed female with a medical history of HTN, HLD, pre-DM, vit D deficiency, right carpal tunnel syndrome while pregnant, obesity who we last saw on 03/05/23 for headaches.  To briefly review: 01/09/23: Patient has had symptoms for about 1 year. She feels pressure on the right side of her head, from the front to top of her head. It is a burning sensation that feels like a tube running in her head. She has significant neck pain and tightness. She denies photophobia, phonophobia, nausea, or vomiting. She was having symptoms 2-3 times per month but has increased to twice a week. She thinks symptoms occur more often on waking and can last until mid day. It can occur during the day though. When she gets a headache, she will sit in a chair. She does not lay down, so is unsure if it changes with position. She does not notice it changing. She endorses blurry vision but not vision loss. This only started when the headaches started. Patient has been to eye doctor this year. Per patient she had a dilated exam and did not see any issues.   One time she had right face and arm (biceps, forearm, into hand) numbness/tingling with the headache (10/2022). It woke her up from sleep and lasted about 2 hours. She went to the ED. Everything checked out, but she did not have any brain imaging per patient (done at [facility] which I cannot see records). While pregnancy, she was told she had right carpal tunnel syndrome. She mentions that sometimes she feels like she is talking slow as well.   She had a CT of head and maxillofacial on 11/27/22 that was normal.   She has tried tylenol or ibuprofen which may knock the edge off, but does not get rid of the headache. BC headache powder will help, but tries not to take it often. She will take something 2-3 times per week. She has never had PT or had any headache  medications.   Regarding sleep, she usually sleeps well, but headaches can keep her up. She is told she snores. She does not gasp for air. She sometimes feels not well rested when waking up. She feels tired throughout the day. She has never had a sleep study.   Regarding mood, she endorses some occasional anxiety, but otherwise denies depressive symptoms.   Caffeine use: 2 [employer] a week Non-smoker EtOH use: wine once a month Restrictive diet: no Family history of neurologic disease, including headaches: no  03/05/23: Labs showed TSH of 0.04 (low) and normal B12 (435). Repeat TSH on 01/19/23 was 0.025 (T3 and T4 normal).   LP on 02/10/22 showed an opening pressure of 14 cm of H2O (normal). Routine analysis of CSF was unremarkable.   Patient is still having burning in her head and neck pain. Patient was going to PT and got dry needling. She took a break after the LP and is currently on the wait list. This helped with neck pain, but she still had the burning in the head.   She continues to sleep poorly with headaches more at night and in the morning.  Most recent Assessment and Plan (03/05/23): This is Marie Perez, a 48 y.o. female with burning pain in her head and neck. She does not call the symptoms "headaches" but there are headache like symptoms of burning and pressure. Given that symptoms  were worse at night when laying down, I considered IIH. Her LP showed normal opening pressure and CSF analysis though. Nocturnal headaches or worse pain in the morning could be the result of undiagnosed OSA though, for which she has several signs. She has seen some improvement of neck pain with PT, but not in head pain. I will start low dose nortriptyline for this.    Plan: -Start Nortriptyline 10 mg at bedtime for 1 week then increase to 20 mg at bedtime  -Sleep medicine referral for possible OSA -Continue physical therapy  Since their last visit: Patient was to follow up on 05/14/23 but was  coming in early. She has been taking Nortriptyline 20 mg daily. This helped and she was not having symptoms. Then on 03/25/23, patient started having the same pain, burning of the scalp on the right. It is happening about twice per week now. It lasts 45 minutes. She is now having the symptoms in the afternoon.  She will be seeing in endocrinology in 05/2023.  MEDICATIONS:  Outpatient Encounter Medications as of 04/03/2023  Medication Sig Note   amLODipine (NORVASC) 5 MG tablet Take 1 tablet (5 mg total) by mouth daily.    atorvastatin (LIPITOR) 20 MG tablet Take 1 tablet (20 mg total) by mouth daily.    Azelastine HCl 137 MCG/SPRAY SOLN Place 1 spray into both nostrils 2 (two) times daily. (Patient taking differently: Place 1 spray into both nostrils 2 (two) times daily as needed.)    cetirizine (ZYRTEC) 10 MG tablet Take 1 tablet by mouth every day    esomeprazole (NEXIUM) 40 MG capsule Take 1 capsule (40 mg total) by mouth 2 (two) times daily before a meal.    fluticasone (FLONASE) 50 MCG/ACT nasal spray Place 2 sprays into both nostrils daily. (Patient taking differently: Place 2 sprays into both nostrils as needed.)    linaclotide (LINZESS) 290 MCG CAPS capsule Take 1 capsule by mouth on an empty stoamch at least 30 minutes before the first meal of the day    montelukast (SINGULAIR) 10 MG tablet Take 1 tablet (10 mg total) by mouth daily. (Patient taking differently: Take 10 mg by mouth as needed.)    nortriptyline (PAMELOR) 10 MG capsule Take 2 capsules (20 mg total) by mouth at bedtime. Take 1 capsule (10 mg) at bedtime for 1 week, then increase to 2 capsules (20 mg) at bedtime thereafter    tirzepatide (MOUNJARO) 7.5 MG/0.5ML Pen Inject 7.5 mg into the skin once a week.    valsartan-hydrochlorothiazide (DIOVAN-HCT) 320-12.5 MG tablet Take 1 tablet by mouth daily.    Vitamin D, Ergocalciferol, 50000 units CAPS Take 1 capsule by mouth once a week.    [DISCONTINUED] Phentermine-Topiramate (QSYMIA)  3.75-23 MG CP24 Take 1 tablet by mouth daily for two weeks, then take 2 tablets by mouth daily 05/03/2022: PA required/insurance won't cover and cash price is too high   No facility-administered encounter medications on file as of 04/03/2023.    PAST MEDICAL HISTORY: Past Medical History:  Diagnosis Date   Hyperlipidemia    Hypertension    Vitamin D deficiency     PAST SURGICAL HISTORY: Past Surgical History:  Procedure Laterality Date   CESAREAN SECTION  01/21/2008   COLONOSCOPY     UPPER GI ENDOSCOPY      ALLERGIES: No Known Allergies  FAMILY HISTORY: Family History  Problem Relation Age of Onset   Hypertension Mother    High Cholesterol Mother    Diabetes Father  Liver disease Neg Hx    Esophageal cancer Neg Hx    Colon cancer Neg Hx     SOCIAL HISTORY: Social History   Tobacco Use   Smoking status: Never   Smokeless tobacco: Never  Vaping Use   Vaping status: Never Used  Substance Use Topics   Alcohol use: No   Drug use: No   Social History   Social History Narrative   Are you right handed or left handed? Right Handed   Are you currently employed ? 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 No   Who lives with you? Husband   What type of home do you live in: 1 story or 2 story? Lives in a one story home          Objective:  Vital Signs:  BP 123/76 (BP Location: Left Arm, Patient Position: Sitting, Cuff Size: Normal)   Pulse 88   LMP 03/02/2023 (Exact Date)   SpO2 98%   General: No acute distress.  Patient appears well-groomed.   Head:  Normocephalic/atraumatic Eyes:  Fundi examined, disc margins clear, no obvious papilledema Neck: supple Lungs:  Non-labored breathing on room air  Neurological Exam: alert and oriented.  Speech fluent and not dysarthric, language intact.  CN II-XII intact. Bulk and tone normal, muscle strength 5/5 throughout.  Sensation to light touch  intact.  Deep tendon reflexes 2+ throughout.  Finger to nose testing intact.  Gait normal.   Labs and Imaging review: New results: 03/27/23: Vit D wnl TSH: < 0.005 Free T4: 2.66 (elevated) HbA1c: 6.0 Lipid panel: tChol 131, LDL 82, TG 66  Previously reviewed results: Lumbar puncture (02/11/23): -Opening pressure 14 cm of H2O -Routine analysis: 2100 RBC, 1 WBC, 52 protein, 58 glucose   01/19/23:  TSH 0.025 Free T4, T3 wnl   B12 (01/09/23): 435   BMP (11/25/22): significant for Cr 1.07 ESR (04/30/20) wnl CBC (04/10/20) unremarkable HbA1c (01/31/20): 6.3 Lipid panel (01/23/20): tChol 194, LDL 129, TG 84   CT head wo contrast (11/27/22): Per my read: there appears to be a partially empty sella.   Radiology read: FINDINGS: Brain: No evidence of acute infarction, hemorrhage, hydrocephalus, extra-axial collection or mass lesion/mass effect.   Vascular: No hyperdense vessel or unexpected calcification.   Skull: Normal. Negative for fracture or focal lesion.   Sinuses/Orbits: No acute finding.   IMPRESSION: No acute intracranial process.   CT maxillofacial wo contrast (11/27/22): FINDINGS: Osseous: No fracture or mandibular dislocation. No destructive process.   Orbits: Negative. No traumatic or inflammatory finding.   Sinuses: Clear.   Soft tissues: Negative.   Limited intracranial: No significant or unexpected finding.   IMPRESSION: Unremarkable examination.  Assessment/Plan:  This is Marie Perez, a 48 y.o. female with burning pain in her head and neck. She does not call the symptoms "headaches" but there are headache like symptoms of burning and pressure. Given that symptoms were worse at night when laying down, I considered IIH. Her LP showed normal opening pressure and CSF analysis though. Nocturnal headaches or worse pain in the morning could be the result of undiagnosed OSA though, for which she has several signs. She has seen some improvement of neck pain with  PT, but not in head pain. I started Nortriptyline on 03/05/23 for atypical head pain and there was improvement until the last week when she has had some breakthrough episodes (4 episodes in  2 weeks).   Plan: -Increase nortriptyline 30 mg at bedtime -Continue PT -See sleep medicine as planned on 04/14/23  Return to clinic in 3 months  Jacquelyne Balint, MD

## 2023-04-01 ENCOUNTER — Other Ambulatory Visit (HOSPITAL_BASED_OUTPATIENT_CLINIC_OR_DEPARTMENT_OTHER): Payer: Self-pay

## 2023-04-02 ENCOUNTER — Ambulatory Visit: Payer: Commercial Managed Care - PPO | Admitting: Family Medicine

## 2023-04-03 ENCOUNTER — Other Ambulatory Visit (HOSPITAL_BASED_OUTPATIENT_CLINIC_OR_DEPARTMENT_OTHER): Payer: Self-pay

## 2023-04-03 ENCOUNTER — Other Ambulatory Visit: Payer: Self-pay | Admitting: Neurology

## 2023-04-03 ENCOUNTER — Ambulatory Visit: Admitting: Neurology

## 2023-04-03 ENCOUNTER — Encounter: Payer: Self-pay | Admitting: Neurology

## 2023-04-03 VITALS — BP 123/76 | HR 88

## 2023-04-03 DIAGNOSIS — F509 Eating disorder, unspecified: Secondary | ICD-10-CM | POA: Diagnosis not present

## 2023-04-03 DIAGNOSIS — Z713 Dietary counseling and surveillance: Secondary | ICD-10-CM | POA: Diagnosis not present

## 2023-04-03 DIAGNOSIS — G5601 Carpal tunnel syndrome, right upper limb: Secondary | ICD-10-CM | POA: Diagnosis not present

## 2023-04-03 DIAGNOSIS — F1090 Alcohol use, unspecified, uncomplicated: Secondary | ICD-10-CM | POA: Diagnosis not present

## 2023-04-03 DIAGNOSIS — R202 Paresthesia of skin: Secondary | ICD-10-CM

## 2023-04-03 DIAGNOSIS — G4486 Cervicogenic headache: Secondary | ICD-10-CM | POA: Diagnosis not present

## 2023-04-03 DIAGNOSIS — M542 Cervicalgia: Secondary | ICD-10-CM | POA: Diagnosis not present

## 2023-04-03 DIAGNOSIS — E669 Obesity, unspecified: Secondary | ICD-10-CM | POA: Diagnosis not present

## 2023-04-03 DIAGNOSIS — R400 Somnolence: Secondary | ICD-10-CM | POA: Diagnosis not present

## 2023-04-03 DIAGNOSIS — F59 Unspecified behavioral syndromes associated with physiological disturbances and physical factors: Secondary | ICD-10-CM | POA: Diagnosis not present

## 2023-04-03 DIAGNOSIS — R0683 Snoring: Secondary | ICD-10-CM

## 2023-04-03 DIAGNOSIS — H539 Unspecified visual disturbance: Secondary | ICD-10-CM

## 2023-04-03 DIAGNOSIS — Z72820 Sleep deprivation: Secondary | ICD-10-CM

## 2023-04-03 MED ORDER — NORTRIPTYLINE HCL 10 MG PO CAPS
30.0000 mg | ORAL_CAPSULE | Freq: Every day | ORAL | 5 refills | Status: DC
Start: 2023-04-03 — End: 2023-04-30
  Filled 2023-04-03: qty 90, 30d supply, fill #0

## 2023-04-03 MED ORDER — NORTRIPTYLINE HCL 10 MG PO CAPS
30.0000 mg | ORAL_CAPSULE | Freq: Every day | ORAL | 5 refills | Status: DC
  Filled 2023-04-03: qty 90, 30d supply, fill #0

## 2023-04-03 NOTE — Patient Instructions (Signed)
-  Increase nortriptyline to 30 mg at bedtime -Continue PT -See sleep medicine as planned on 04/14/23  I will see you back in clinic in about 3 months. Please let me know if you have any questions or concerns in the meantime.   The physicians and staff at [facility] are committed to providing excellent care. You may receive a survey requesting feedback about your experience at our office. We strive to receive "very good" responses to the survey questions. If you feel that your experience would prevent you from giving the office a "very good " response, please contact our office to try to remedy the situation. We may be reached at [phone]. Thank you for taking the time out of your busy day to complete the survey.  Jacquelyne Balint, MD [facility]

## 2023-04-03 NOTE — Addendum Note (Signed)
 Addended by: Jacquelyne Balint L on: 04/03/2023 03:00 PM   Modules accepted: Orders

## 2023-04-09 ENCOUNTER — Other Ambulatory Visit (INDEPENDENT_AMBULATORY_CARE_PROVIDER_SITE_OTHER)

## 2023-04-09 ENCOUNTER — Encounter: Payer: Self-pay | Admitting: Family Medicine

## 2023-04-09 ENCOUNTER — Ambulatory Visit (HOSPITAL_BASED_OUTPATIENT_CLINIC_OR_DEPARTMENT_OTHER): Attending: Neurology | Admitting: Physical Therapy

## 2023-04-09 DIAGNOSIS — M542 Cervicalgia: Secondary | ICD-10-CM | POA: Insufficient documentation

## 2023-04-09 DIAGNOSIS — R293 Abnormal posture: Secondary | ICD-10-CM | POA: Insufficient documentation

## 2023-04-09 DIAGNOSIS — N289 Disorder of kidney and ureter, unspecified: Secondary | ICD-10-CM | POA: Diagnosis not present

## 2023-04-09 DIAGNOSIS — M25531 Pain in right wrist: Secondary | ICD-10-CM | POA: Insufficient documentation

## 2023-04-09 LAB — BASIC METABOLIC PANEL
BUN: 12 mg/dL (ref 6–23)
CO2: 26 meq/L (ref 19–32)
Calcium: 9.6 mg/dL (ref 8.4–10.5)
Chloride: 103 meq/L (ref 96–112)
Creatinine, Ser: 0.92 mg/dL (ref 0.40–1.20)
GFR: 73.43 mL/min (ref >60.00)
Glucose, Bld: 97 mg/dL (ref 70–99)
Potassium: 3.7 meq/L (ref 3.5–5.1)
Sodium: 136 meq/L (ref 135–145)

## 2023-04-10 ENCOUNTER — Other Ambulatory Visit (HOSPITAL_BASED_OUTPATIENT_CLINIC_OR_DEPARTMENT_OTHER): Payer: Self-pay

## 2023-04-11 ENCOUNTER — Other Ambulatory Visit (HOSPITAL_BASED_OUTPATIENT_CLINIC_OR_DEPARTMENT_OTHER): Payer: Self-pay

## 2023-04-11 MED ORDER — ATORVASTATIN CALCIUM 20 MG PO TABS
20.0000 mg | ORAL_TABLET | Freq: Every day | ORAL | 0 refills | Status: DC
  Filled 2023-04-11: qty 30, 30d supply, fill #0

## 2023-04-11 MED ORDER — LINZESS 290 MCG PO CAPS
290.0000 ug | ORAL_CAPSULE | Freq: Every day | ORAL | 0 refills | Status: DC
  Filled 2023-04-11: qty 30, 30d supply, fill #0

## 2023-04-14 ENCOUNTER — Encounter (HOSPITAL_BASED_OUTPATIENT_CLINIC_OR_DEPARTMENT_OTHER): Payer: Self-pay | Admitting: Primary Care

## 2023-04-14 ENCOUNTER — Other Ambulatory Visit (HOSPITAL_BASED_OUTPATIENT_CLINIC_OR_DEPARTMENT_OTHER): Payer: Self-pay

## 2023-04-14 ENCOUNTER — Ambulatory Visit (HOSPITAL_BASED_OUTPATIENT_CLINIC_OR_DEPARTMENT_OTHER): Admitting: Primary Care

## 2023-04-14 ENCOUNTER — Encounter: Payer: Self-pay | Admitting: Gastroenterology

## 2023-04-14 ENCOUNTER — Institutional Professional Consult (permissible substitution) (HOSPITAL_BASED_OUTPATIENT_CLINIC_OR_DEPARTMENT_OTHER): Payer: Commercial Managed Care - PPO | Admitting: Primary Care

## 2023-04-14 ENCOUNTER — Ambulatory Visit: Admitting: Gastroenterology

## 2023-04-14 VITALS — BP 116/82 | HR 96 | Ht 65.0 in | Wt 240.0 lb

## 2023-04-14 VITALS — BP 122/78 | HR 103 | Ht 65.0 in | Wt 240.3 lb

## 2023-04-14 DIAGNOSIS — R5382 Chronic fatigue, unspecified: Secondary | ICD-10-CM

## 2023-04-14 DIAGNOSIS — E0590 Thyrotoxicosis, unspecified without thyrotoxic crisis or storm: Secondary | ICD-10-CM

## 2023-04-14 DIAGNOSIS — K5909 Other constipation: Secondary | ICD-10-CM | POA: Diagnosis not present

## 2023-04-14 DIAGNOSIS — R7989 Other specified abnormal findings of blood chemistry: Secondary | ICD-10-CM

## 2023-04-14 DIAGNOSIS — R0683 Snoring: Secondary | ICD-10-CM | POA: Diagnosis not present

## 2023-04-14 DIAGNOSIS — E049 Nontoxic goiter, unspecified: Secondary | ICD-10-CM | POA: Diagnosis not present

## 2023-04-14 DIAGNOSIS — K222 Esophageal obstruction: Secondary | ICD-10-CM

## 2023-04-14 DIAGNOSIS — R1310 Dysphagia, unspecified: Secondary | ICD-10-CM | POA: Diagnosis not present

## 2023-04-14 DIAGNOSIS — K219 Gastro-esophageal reflux disease without esophagitis: Secondary | ICD-10-CM | POA: Diagnosis not present

## 2023-04-14 MED ORDER — FAMOTIDINE 40 MG PO TABS
40.0000 mg | ORAL_TABLET | Freq: Every day | ORAL | 2 refills | Status: DC
  Filled 2023-04-14: qty 30, 30d supply, fill #0
  Filled 2023-05-22: qty 30, 30d supply, fill #1
  Filled 2023-07-09: qty 30, 30d supply, fill #2

## 2023-04-14 NOTE — Patient Instructions (Addendum)
 -  SLEEP APNEA: Sleep apnea is a condition where breathing repeatedly stops and starts during sleep. We have ordered a home sleep study to determine the severity of your sleep apnea and guide treatment. Depending on the results, treatment options may include CPAP therapy for moderate to severe cases or conservative management with weight loss and positional therapy for mild cases.  -MIGRAINE HEADACHES: Migraines are severe headaches often accompanied by visual disturbances, nausea, and vomiting. Your migraines are currently managed with nortriptyline, which has improved your symptoms. We have differentiated these headaches from those potentially related to sleep apnea.  -HYPERPARATHYROIDISM: Hyperparathyroidism is a condition where the parathyroid glands produce too much hormone, leading to low TSH levels. This can be caused by medication, thyroid nodules, or inflammation. We have ordered a thyroid ultrasound to evaluate for any thyroid nodules or abnormalities.  -WEIGHT LOSS: You have successfully reduced your weight from 260 lbs to 240 lbs with the help of Mounjaro 7.5 mg weekly. Continued weight loss can help manage sleep apnea and improve overall health.  INSTRUCTIONS:  Please complete the home sleep study as ordered and send a MyChart message two weeks after returning the study. We will arrange a virtual visit to discuss the results and potential treatment options if moderate or severe sleep apnea is diagnosed. Additionally, please schedule a thyroid ultrasound to evaluate for any thyroid nodules or abnormalities.  Follow-up Call or send mychart message 2-3 weeks after completing sleep study for results

## 2023-04-14 NOTE — Patient Instructions (Signed)
 We have sent the following medications to your pharmacy for you to pick up at your convenience: Famotidine 40mg   You have been scheduled for a Barium Esophogram at [facility] (1st floor of the hospital) on Thursday 04/23/23 at 10:30. Please arrive 30 minutes prior to your appointment for registration. Make certain not to have anything to eat or drink 3 hours prior to your test. If you need to reschedule for any reason, please contact radiology at [phone] to do so. __________________________________________________________________ A barium swallow is an examination that concentrates on views of the esophagus. This tends to be a double contrast exam (barium and two liquids which, when combined, create a gas to distend the wall of the oesophagus) or single contrast (non-ionic iodine based). The study is usually tailored to your symptoms so a good history is essential. Attention is paid during the study to the form, structure and configuration of the esophagus, looking for functional disorders (such as aspiration, dysphagia, achalasia, motility and reflux) EXAMINATION You may be asked to change into a gown, depending on the type of swallow being performed. A radiologist and radiographer will perform the procedure. The radiologist will advise you of the type of contrast selected for your procedure and direct you during the exam. You will be asked to stand, sit or lie in several different positions and to hold a small amount of fluid in your mouth before being asked to swallow while the imaging is performed .In some instances you may be asked to swallow barium coated marshmallows to assess the motility of a solid food bolus. The exam can be recorded as a digital or video fluoroscopy procedure. POST PROCEDURE It will take 1-2 days for the barium to pass through your system. To facilitate this, it is important, unless otherwise directed, to increase your fluids for the next 24-48hrs and to resume  your normal diet.  This test typically takes about 30 minutes to perform. __________________________________________________________________________________  Due to recent changes in healthcare laws, you may see the results of your imaging and laboratory studies on MyChart before your provider has had a chance to review them.  We understand that in some cases there may be results that are confusing or concerning to you. Not all laboratory results come back in the same time frame and the provider may be waiting for multiple results in order to interpret others.  Please give [place] 48 hours in order for your provider to thoroughly review all the results before contacting the office for clarification of your results.

## 2023-04-14 NOTE — Progress Notes (Signed)
 Epworth Sleepiness Scale  Use the following scale to choose the most appropriate number for each situation. 0 Would never nod off 1  Slight  chance of nodding off 2 Moderate chance of nodding off 3 High chance of nodding off  Sitting and reading: 0 Watching TV: 1 Sitting, inactive, in a public place (e.g., in a meeting, theater, or dinner event): 0 As a passenger in a car for an hour or more without stopping for a break: 3 Lying down to rest when circumstances permit:3 Sitting and talking to someone: 0 Sitting quietly after a meal without alcohol: 0 In a car, while stopped for a few  minutes in traffic or at a light: 0  TOTOAL: 7

## 2023-04-14 NOTE — Progress Notes (Signed)
 @Patient  ID: Marie Perez, female    DOB: 1975-11-23, 48 y.o.   MRN: [id number]  Chief Complaint  Patient presents with   Establish Care    Concerns for Sleep apnea    Referring provider: Antony Madura, MD  HPI: 47 year old, never smoker. PMH significant HTN, goiter, chronic constipation, fatigue, hyperlipidemia, seasonal allergies, vit D deficiency.   04/14/2023 Discussed the use of AI scribe software for clinical note transcription with the patient, who gave verbal consent to proceed.  History of Present Illness   Marie Perez "Marie Perez" is a 48 year old female who presents with trouble sleeping at night. She was referred by neurology for evaluation of hypersomnia and daytime fatigue.  She experiences difficulty sleeping at night, specifically waking up around 3 AM and being unable to fall back asleep. She typically goes to bed at 10 PM and starts her day at 7 AM. No loud snoring that disturbs her husband, and he has not witnessed apneic events, although he occasionally nudges her to stop snoring when she is very tired. She primarily sleeps on her side. She has not had a sleep study before, although one was ordered in 2024 but not completed.  She has been experiencing hypersomnia and daytime fatigue, which led to her referral to the sleep clinic. No history of congestive heart failure, myocardial infarction, COPD, asthma, seizure disorders, sleepwalking, or restless leg symptoms.  She has a history of migraines and is currently on nortriptyline, taking three capsules by mouth at bedtime for the past two months. The medication has improved her migraines, although she experienced a severe headache upon waking recently.  Her weight has decreased from 260 pounds to 240 pounds over the past year. She is currently on Mounjaro 7.5 mg once a week, which is covered by her insurance. She has not reached her goal weight yet.   No concern for narcolepsy, cataplexy or sleep walking. Epworth  7/24.       No Known Allergies  Immunization History  Administered Date(s) Administered   Fluzone Influenza virus vaccine,trivalent (IIV3), split virus 02/20/2022   Influenza Inj Mdck Quad With Preservative 11/08/2018   Influenza Nasal 12/06/2019   Influenza Whole 10/20/2009   Influenza, Seasonal, Injecte, Preservative Fre 10/28/2022   PFIZER(Purple Top)SARS-COV-2 Vaccination 04/02/2019, 04/25/2019, 01/30/2020   Td 09/19/2003    Past Medical History:  Diagnosis Date   Hyperlipidemia    Hypertension    Vitamin D deficiency     Tobacco History: Social History   Tobacco Use  Smoking Status Never  Smokeless Tobacco Never   Counseling given: Not Answered   Outpatient Medications Prior to Visit  Medication Sig Dispense Refill   amLODipine (NORVASC) 5 MG tablet Take 1 tablet (5 mg total) by mouth daily. 90 tablet 3   atorvastatin (LIPITOR) 20 MG tablet Take 1 tablet (20 mg total) by mouth daily. 30 tablet 0   cetirizine (ZYRTEC) 10 MG tablet Take 1 tablet by mouth every day 90 tablet 3   esomeprazole (NEXIUM) 40 MG capsule Take 1 capsule (40 mg total) by mouth 2 (two) times daily before a meal. 60 capsule 11   famotidine (PEPCID) 40 MG tablet Take 1 tablet (40 mg total) by mouth at bedtime. 30 tablet 2   linaclotide (LINZESS) 290 MCG CAPS capsule Take 1 capsule (290 mcg total) by mouth daily at least 30 minutes before the first meal of the day on an empty stomach. 30 capsule 0   montelukast (SINGULAIR) 10  MG tablet Take 1 tablet (10 mg total) by mouth daily. (Patient taking differently: Take 10 mg by mouth as needed.) 90 tablet 3   nortriptyline (PAMELOR) 10 MG capsule Take 3 capsules (30 mg total) by mouth at bedtime. 90 capsule 5   senna (SENOKOT) 8.6 MG TABS tablet Take 1 tablet by mouth daily as needed for mild constipation.     tirzepatide (MOUNJARO) 7.5 MG/0.5ML Pen Inject 7.5 mg into the skin once a week. 2 mL 1   valsartan-hydrochlorothiazide (DIOVAN-HCT) 320-12.5 MG  tablet Take 1 tablet by mouth daily. 90 tablet 3   Vitamin D, Ergocalciferol, 50000 units CAPS Take 1 capsule by mouth once a week. 12 capsule 3   No facility-administered medications prior to visit.    Review of Systems  Review of Systems  Constitutional:  Positive for fatigue.  Respiratory: Negative.    Psychiatric/Behavioral:  Positive for sleep disturbance.    Physical Exam  BP 122/78   Pulse (!) 103   Ht 5\' 5"  (1.651 m)   Wt 240 lb 4.8 oz (109 kg)   LMP 03/02/2023 (Exact Date)   SpO2 97%   BMI 39.99 kg/m  Physical Exam Constitutional:      General: She is not in acute distress.    Appearance: Normal appearance. She is not ill-appearing.  HENT:     Head: Normocephalic and atraumatic.     Mouth/Throat:     Mouth: Mucous membranes are moist.     Pharynx: Oropharynx is clear.  Neck:     Comments: Goiter Cardiovascular:     Rate and Rhythm: Normal rate and regular rhythm.  Pulmonary:     Effort: Pulmonary effort is normal.     Breath sounds: Normal breath sounds.  Musculoskeletal:        General: Normal range of motion.     Cervical back: Normal range of motion and neck supple.  Skin:    General: Skin is warm and dry.  Neurological:     General: No focal deficit present.     Mental Status: She is alert and oriented to person, place, and time. Mental status is at baseline.  Psychiatric:        Mood and Affect: Mood normal.        Behavior: Behavior normal.        Thought Content: Thought content normal.        Judgment: Judgment normal.      Lab Results:  CBC    Component Value Date/Time   WBC 6.5 03/27/2023 1317   WBC 9.4 04/10/2020 1817   RBC 4.78 03/27/2023 1317   RBC 4.90 04/10/2020 1817   HGB 13.6 03/27/2023 1317   HCT 42.3 03/27/2023 1317   PLT 290 03/27/2023 1317   MCV 89 03/27/2023 1317   MCH 28.5 03/27/2023 1317   MCH 29.6 04/10/2020 1817   MCHC 32.2 03/27/2023 1317   MCHC 34.1 04/10/2020 1817   RDW 13.9 03/27/2023 1317   LYMPHSABS 1.9  03/27/2023 1317   MONOABS 0.6 04/10/2020 1817   EOSABS 0.0 03/27/2023 1317   BASOSABS 0.0 03/27/2023 1317    BMET    Component Value Date/Time   NA 136 04/09/2023 0832   NA 137 03/27/2023 1317   K 3.7 04/09/2023 0832   CL 103 04/09/2023 0832   CO2 26 04/09/2023 0832   GLUCOSE 97 04/09/2023 0832   BUN 12 04/09/2023 0832   BUN 9 03/27/2023 1317   CREATININE 0.92 04/09/2023 0832   CALCIUM  9.6 04/09/2023 0832   GFRNONAA 54 (L) 01/23/2020 1607   GFRAA 62 01/23/2020 1607    BNP No results found for: "BNP"  ProBNP No results found for: "PROBNP"  Imaging: No results found.   Assessment & Plan:   1. Goiter - Home sleep test; Future - US THYROID; Future  2. Low TSH level - Home sleep test; Future - US THYROID; Future  3. Loud snoring - Home sleep test; Future - US THYROID; Future  4. Chronic fatigue (Primary) - Home sleep test; Future - US THYROID; Future      Sleep Apnea Symptoms suggestive of sleep apnea. Discussed risks of untreated sleep apnea and treatment options based on severity. Home sleep study to determine severity and guide treatment. - Order home sleep study through [facility]. - Instruct to call two weeks post-return of sleep study for results. - Discuss treatment options based on sleep study results, including CPAP for moderate to severe sleep apnea. - Consider conservative management with weight loss and positional therapy if mild sleep apnea is diagnosed.  Migraine Headaches Migraines managed with nortriptyline, improved symptoms.   Hyperparathyroidism Low TSH suggests hyperparathyroidism. Possible causes include medication, graves disease, thyroid nodules or inflammation. GLP1 medication use noted for weight loss with thyroid cancer warning.  - Order thyroid ultrasound to evaluate for thyroid nodules or abnormalities. - Patient will establish with endocrinology in May   Weight Loss Weight reduced from 260 lbs to 240 lbs on Mounjaro  7.5 mg weekly. Emphasized weight loss's role in managing sleep apnea.  Follow-up Instructions provided for follow-up post-sleep study and potential virtual visits to review results and treatment options. - Instruct to send a MyChart message two weeks after returning the sleep study. - Arrange virtual visit to discuss sleep study results if moderate or severe sleep apnea is diagnosed.      Glenford Bayley, NP 04/14/2023

## 2023-04-14 NOTE — Progress Notes (Signed)
 Chief Complaint: dysphagia and constipation Primary GI MD: Dr. Marina Goodell  HPI: 48 year old female history of chronic GERD, intermittent solid food dysphagia, presents for evaluation of dysphagia and constipation  Last seen 09/2022 by Dr. Marina Goodell.  At that time she was having persistent GERD and solid food dysphagia.  She underwent EGD 10/2022 which showed a stricture and was dilated to 20 mm.  Recent labs 03/27/2023 with TSH < 0.005, free T4 2.66.  Patient was referred to endocrinology for further evaluation of hyperthyroidism.  CBC with no anemia.  She experiences difficulty swallowing both solids and liquids, with sensations of food getting caught in her throat. These episodes occur intermittently and have led to cautious eating and drinking habits. She notes improvement in symptoms following a previous endoscopy, but they have since recurred. No pain is associated with swallowing difficulties.  She is not interested in repeat endoscopy.  She continues to experience heartburn despite taking Nexium twice daily. Heartburn symptoms persist.  She reports chronic constipation, with bowel movements occurring only twice a week. She takes Linzess 290 mcg, but finds it less effective than before. She supplements with Senokot for relief and occasionally uses MiraLAX, which is effective but slower to work.  She is on [facility] with a dose increase to 7.5 mg in February, but does not attribute any change in her constipation to this medication. No worsening of constipation with the recent dose increase of Mounjaro.      PREVIOUS GI WORKUP   EGD 11/17/2022 - Benign-appearing intrinsic moderate ringlike stenosis 35 cm from incisors.  Dilation performed to 20 mm - Moderate hiatal hernia  Colonoscopy 11/17/2022 - Two 1 to 2 mm polyps in the ascending colon and in the cecum, removed with a cold snare. Resected and retrieved.  - Internal hemorrhoids.  - The examination was otherwise normal on direct and  retroflexion views. -Recall 7 years  1. Surgical [P], colon, cecum, polyp (1) :       - TUBULAR ADENOMA       - NEGATIVE FOR HIGH-GRADE DYSPLASIA OR MALIGNANCY        2. Surgical [P], colon, ascending, polyp (1) :       - POLYPOID COLONIC MUCOSA WITH MILD HYPERPLASTIC CHANGES       - NEGATIVE FOR DYSPLASIA   Past Medical History:  Diagnosis Date   Hyperlipidemia    Hypertension    Vitamin D deficiency     Past Surgical History:  Procedure Laterality Date   CESAREAN SECTION  01/21/2008   COLONOSCOPY     UPPER GI ENDOSCOPY      Current Outpatient Medications  Medication Sig Dispense Refill   amLODipine (NORVASC) 5 MG tablet Take 1 tablet (5 mg total) by mouth daily. 90 tablet 3   atorvastatin (LIPITOR) 20 MG tablet Take 1 tablet (20 mg total) by mouth daily. 30 tablet 0   cetirizine (ZYRTEC) 10 MG tablet Take 1 tablet by mouth every day 90 tablet 3   esomeprazole (NEXIUM) 40 MG capsule Take 1 capsule (40 mg total) by mouth 2 (two) times daily before a meal. 60 capsule 11   famotidine (PEPCID) 40 MG tablet Take 1 tablet (40 mg total) by mouth at bedtime. 30 tablet 2   linaclotide (LINZESS) 290 MCG CAPS capsule Take 1 capsule (290 mcg total) by mouth daily at least 30 minutes before the first meal of the day on an empty stomach. 30 capsule 0   montelukast (SINGULAIR) 10 MG tablet Take 1 tablet (10  mg total) by mouth daily. (Patient taking differently: Take 10 mg by mouth as needed.) 90 tablet 3   nortriptyline (PAMELOR) 10 MG capsule Take 3 capsules (30 mg total) by mouth at bedtime. 90 capsule 5   senna (SENOKOT) 8.6 MG TABS tablet Take 1 tablet by mouth daily as needed for mild constipation.     tirzepatide (MOUNJARO) 7.5 MG/0.5ML Pen Inject 7.5 mg into the skin once a week. 2 mL 1   valsartan-hydrochlorothiazide (DIOVAN-HCT) 320-12.5 MG tablet Take 1 tablet by mouth daily. 90 tablet 3   Vitamin D, Ergocalciferol, 50000 units CAPS Take 1 capsule by mouth once a week. 12 capsule 3    No current facility-administered medications for this visit.    Allergies as of 04/14/2023   (No Known Allergies)    Family History  Problem Relation Age of Onset   Hypertension Mother    High Cholesterol Mother    Diabetes Father    Liver disease Neg Hx    Esophageal cancer Neg Hx    Colon cancer Neg Hx    Pancreatic cancer Neg Hx    Stomach cancer Neg Hx     Social History   Socioeconomic History   Marital status: Married    Spouse name: Not on file   Number of children: 1   Years of education: Not on file   Highest education level: Associate degree: occupational, Scientist, product/process development, or vocational program  Occupational History   Occupation: Archivist  Tobacco Use   Smoking status: Never   Smokeless tobacco: Never  Vaping Use   Vaping status: Never Used  Substance and Sexual Activity   Alcohol use: No   Drug use: No   Sexual activity: Yes    Birth control/protection: None  Other Topics Concern   Not on file  Social History Narrative   Are you right handed or left handed? Right Handed   Are you currently employed ? 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 No   Who lives with you? Husband   What type of home do you live in: 1 story or 2 story? Lives in a one story home       Social Drivers of Health   Financial Resource Strain: Low Risk  (01/15/2023)   Overall Financial Resource Strain (CARDIA)    Difficulty of Paying Living Expenses: Not hard at all  Food Insecurity: No Food Insecurity (01/15/2023)   Hunger Vital Sign    Worried About Running Out of Food in the Last Year: Never true    Ran Out of Food in the Last Year: Never true  Transportation Needs: No Transportation Needs (01/15/2023)   PRAPARE - Administrator, Civil Service (Medical): No    Lack of Transportation (Non-Medical): No  Physical Activity: Insufficiently Active (01/15/2023)   Exercise Vital Sign    Days of  Exercise per Week: 2 days    Minutes of Exercise per Session: 10 min  Stress: No Stress Concern Present (01/15/2023)   [employer] of Occupational Health - Occupational Stress Questionnaire    Feeling of Stress : Not at all  Social Connections: Moderately Integrated (01/15/2023)   Social Connection and Isolation Panel [NHANES]    Frequency of Communication with Friends and Family: Twice a week    Frequency of Social Gatherings with Friends and Family: Once a week    Attends Religious Services: More than 4  times per year    Active Member of Clubs or Organizations: No    Attends Banker Meetings: Not on file    Marital Status: Married  Intimate Partner Violence: Not At Risk (03/27/2023)   Humiliation, Afraid, Rape, and Kick questionnaire    Fear of Current or Ex-Partner: No    Emotionally Abused: No    Physically Abused: No    Sexually Abused: No    Review of Systems:    Constitutional: No weight loss, fever, chills, weakness or fatigue HEENT: Eyes: No change in vision               Ears, Nose, Throat:  No change in hearing or congestion Skin: No rash or itching Cardiovascular: No chest pain, chest pressure or palpitations   Respiratory: No SOB or cough Gastrointestinal: See HPI and otherwise negative Genitourinary: No dysuria or change in urinary frequency Neurological: No headache, dizziness or syncope Musculoskeletal: No new muscle or joint pain Hematologic: No bleeding or bruising Psychiatric: No history of depression or anxiety    Physical Exam:  Vital signs: BP 116/82   Pulse 96   Ht 5\' 5"  (1.651 m)   Wt 240 lb (108.9 kg)   LMP 03/02/2023 (Exact Date)   SpO2 97%   BMI 39.94 kg/m   Constitutional: NAD, Well developed, Well nourished, alert and cooperative Head:  Normocephalic and atraumatic. Eyes:   PEERL, EOMI. No icterus. Conjunctiva pink. Respiratory: Respirations even and unlabored. Lungs clear to auscultation bilaterally.   No wheezes,  crackles, or rhonchi.  Cardiovascular:  Regular rate and rhythm. No peripheral edema, cyanosis or pallor.  Gastrointestinal:  Soft, nondistended, nontender. No rebound or guarding. Normal bowel sounds. No appreciable masses or hepatomegaly. Rectal:  Not performed.  Msk:  Symmetrical without gross deformities. Without edema, no deformity or joint abnormality.  Neurologic:  Alert and  oriented x4;  grossly normal neurologically.  Skin:   Dry and intact without significant lesions or rashes. Psychiatric: Oriented to person, place and time. Demonstrates good judgement and reason without abnormal affect or behaviors.  RELEVANT LABS AND IMAGING: CBC    Component Value Date/Time   WBC 6.5 03/27/2023 1317   WBC 9.4 04/10/2020 1817   RBC 4.78 03/27/2023 1317   RBC 4.90 04/10/2020 1817   HGB 13.6 03/27/2023 1317   HCT 42.3 03/27/2023 1317   PLT 290 03/27/2023 1317   MCV 89 03/27/2023 1317   MCH 28.5 03/27/2023 1317   MCH 29.6 04/10/2020 1817   MCHC 32.2 03/27/2023 1317   MCHC 34.1 04/10/2020 1817   RDW 13.9 03/27/2023 1317   LYMPHSABS 1.9 03/27/2023 1317   MONOABS 0.6 04/10/2020 1817   EOSABS 0.0 03/27/2023 1317   BASOSABS 0.0 03/27/2023 1317    CMP     Component Value Date/Time   NA 136 04/09/2023 0832   NA 137 03/27/2023 1317   K 3.7 04/09/2023 0832   CL 103 04/09/2023 0832   CO2 26 04/09/2023 0832   GLUCOSE 97 04/09/2023 0832   BUN 12 04/09/2023 0832   BUN 9 03/27/2023 1317   CREATININE 0.92 04/09/2023 0832   CALCIUM 9.6 04/09/2023 0832   PROT 6.9 03/27/2023 1317   ALBUMIN 4.1 03/27/2023 1317   AST 25 03/27/2023 1317   ALT 17 03/27/2023 1317   ALKPHOS 67 03/27/2023 1317   BILITOT 0.5 03/27/2023 1317   GFRNONAA 54 (L) 01/23/2020 1607   GFRAA 62 01/23/2020 1607     Assessment/Plan:  Dysphagia History of stricture Intermittent dysphagia for solids and thick liquids with choking episodes. Symptoms improved post-endoscopy but recurred. Differential includes  esophageal motility disorder, globus sensation, or stricture. Prefers non-invasive diagnostics.  - Barium swallow with tablet for further evaluation  GERD Obesity Persistent heartburn despite Nexium twice daily. Avoidance of Carafate due to constipation risk. - Prescribe Pepcid 40 mg at bedtime in addition to Nexium. - Educated patient on lifestyle modifications and provided patient education handout  Constipation Chronic constipation with bowel movements twice a week. Linzess 290 mcg and occasional Senokot and MiraLAX use. MiraLAX effective but slow. - Advise regular use of MiraLAX in addition to Linzess to maintain bowel regularity. - If no improvement on Linzess with MiraLAX can try to switch prescription strength constipation medication.  Maybe do trial of Amitiza 24 mcg versus Motegrity  Hyperthyroidism Recently diagnosed with hyperthyroidism with a follow-up appointment with endocrinology in May.  Likely affecting her symptoms.  History of colon polyps Colonoscopy 10/2022 with 7-year recall - Repeat 10/2029    Boone Master, PA-C [facility] 04/14/2023, 11:21 AM  Cc: Deeann Saint, MD

## 2023-04-14 NOTE — Progress Notes (Signed)
 Noted.

## 2023-04-15 ENCOUNTER — Ambulatory Visit (HOSPITAL_BASED_OUTPATIENT_CLINIC_OR_DEPARTMENT_OTHER): Admitting: Physical Therapy

## 2023-04-15 DIAGNOSIS — M542 Cervicalgia: Secondary | ICD-10-CM

## 2023-04-15 DIAGNOSIS — M25531 Pain in right wrist: Secondary | ICD-10-CM

## 2023-04-15 DIAGNOSIS — R293 Abnormal posture: Secondary | ICD-10-CM

## 2023-04-15 NOTE — Therapy (Signed)
 OUTPATIENT PHYSICAL THERAPY CERVICAL Progress note    Patient Name: Marie Perez MRN: [id number] DOB:May 07, 1975, 48 y.o., female Today's Date: 04/17/2023  END OF SESSION:  PT End of Session - 04/17/23 0916     Visit Number 5    Number of Visits 13    Date for PT Re-Evaluation 06/10/23    PT Start Time 1101    PT Stop Time 1143    PT Time Calculation (min) 42 min    Activity Tolerance Patient tolerated treatment well    Behavior During Therapy [facility] for tasks assessed/performed               Past Medical History:  Diagnosis Date   Hyperlipidemia    Hypertension    Vitamin D deficiency    Past Surgical History:  Procedure Laterality Date   CESAREAN SECTION  01/21/2008   COLONOSCOPY     UPPER GI ENDOSCOPY     Patient Active Problem List   Diagnosis Date Noted   Hyperlipidemia 03/27/2023   Seasonal allergies 03/27/2023   Chronic constipation 03/27/2023   Vitamin D deficiency 03/27/2023   Musculoskeletal pain 12/24/2010   Sinusitis 05/01/2010   CARPAL TUNNEL SYNDROME, RIGHT 04/05/2010   FATIGUE 02/27/2010   Fatigue 02/27/2010   FREQUENCY, URINARY 11/26/2009   OBESITY, UNSPECIFIED 10/31/2008   Essential hypertension 10/31/2008   UTI 10/31/2008   Vaginitis and vulvovaginitis 10/31/2008   Goiter 01/01/2000    PCP: Abbe Amsterdam MD   REFERRING PROVIDER: Jacquelyne Balint MD   REFERRING DIAG:   THERAPY DIAG:  Cervicalgia  Pain in right wrist  Abnormal posture  Rationale for Evaluation and Treatment: Rehabilitation  ONSET DATE:   SUBJECTIVE:                                                                                                                                                                                                         SUBJECTIVE STATEMENT: The patient reports the pain has improved overall. Over the past few days she has had tightness in her lower cervical spine and between the shoulder blades.   Eval: Approximately 1 month ago the  patient had an acute onset of right wrist and hand pain.  She works at [employer].  Her pain is exacerbated by computer work.  She is right-hand dominant.  About 2 months prior she had onset of neck pain that radiates down to her elbow.  She reports work on the upper trap improved the pain for short period of time.  She also has a area of pressure behind her  eye.  She reports they found some type of fluid behind her eye.  She will further follow-up on this coming up. Hand dominance: Right  PERTINENT HISTORY:  Hypertension, C-section, vitamin D deficiency  PAIN:  Are you having pain? Yes: NPRS scale: 6 10 Pain location: midline lower cervical/upper thoracic Pain description: Aching Aggravating factors: Looking down at the computer Relieving factors: Rest  PRECAUTIONS: None  RED FLAGS: None     WEIGHT BEARING RESTRICTIONS: No  FALLS:  Has patient fallen in last 6 months? 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  PATIENT SURVEYS:  FOTO 58% ability 69% expected in 10 visits   COGNITION: Overall cognitive status: Within functional limits for tasks assessed  SENSATION: Feels a pressure sensation behind her eye at times POSTURE: No Significant postural limitations  PALPATION: Tenderness to palpation in upper trap and into the right cervical paraspinals.  No significant tenderness to palpation in the anterior shoulder  CERVICAL ROM:   Active ROM A/PROM (deg) eval   Flexion 20 degrees with pain  15  Extension  18   Right lateral flexion    Left lateral flexion    Right rotation 63 degrees painful  72  Left rotation 55 degrees no pain  70   (Blank rows = not tested)  UPPER EXTREMITY ROM:  Upper extremity MMT  Right eval Left eval Right  Left   Shoulder flexion 4+ 4+    Shoulder extension      Shoulder abduction      Shoulder adduction      Shoulder extension      Shoulder internal rotation 5 5    Shoulder external rotation 4+ 4+    Elbow flexion      Elbow extension            Wrist flexion      Wrist extension      Wrist ulnar deviation      Wrist radial deviation      Wrist pronation      Wrist supination       (Blank rows = not tested) grip right 40 pounds grip left 45 pounds  UPPER EXTREMITY MMT: Full active upper extremity range of motion of the right arm but mild tightness felt with internal rotation.  TREATMENT DATE:                                                                                                                               3/26 Subsequent Treatment: Instructions provided previously at initial dry needling treatment.   Patient Verbal Consent Given: Yes Education Handout Provided: Previously Provided Muscles Treated: bilateral upper trap 3x  Electrical Stimulation  Performed: No Treatment Response/Outcome: twitch with decreased spasm  Manual: STM to upper trap and cervical paraspinals. Skilled palapation of trigger points. Reviewed of patients trigger points   Scap retraction 3x10 cable 10 lbs  Shoulder extension 3x10 10 lbs   Reviewed RPE   1/14 Trigger Point Dry Needling  Subsequent Treatment: Instructions provided previously at initial dry needling treatment.   Patient Verbal Consent Given: Yes Education Handout Provided: Previously Provided Muscles Treated: bilateral upper trap, Rt levator scap Electrical Stimulation Performed: No Treatment Response/Outcome: twitch with decreased spasm  Prone rib mobs, thoracic PAs Seated W red tband- inhale at end range Seated Y red tband with inhale at the top Seated round back + GHJ ER red tband Door pec stretch     PATIENT EDUCATION:  Education details: HEP, symptom management, posture at work   Person educated: Patient Education method: Programmer, multimedia, Facilities manager, Actor cues, Verbal cues, and Handouts Education comprehension: verbalized understanding, returned demonstration, verbal cues required, tactile cues required, and needs further education  HOME EXERCISE PROGRAM: Access Code: [email or web address] URL: [email or web address]  ASSESSMENT:  CLINICAL IMPRESSION: Overall the patient is making progress. She has been more conscious of her posture at work. She has been working on posterior chain strengthening. Her cervical rotation has improved. She has continued tightness in the paraspinals and her mid back. See below for goal specific progress. She would benefit from continued skilled therapy to improve ability to sit at work and perform ADLs  OBJECTIVE IMPAIRMENTS: decreased activity tolerance, decreased mobility, decreased ROM, decreased strength, impaired flexibility, impaired UE functional use, postural dysfunction, and pain.   ACTIVITY LIMITATIONS: carrying, lifting, sitting, and reach over head  PARTICIPATION LIMITATIONS: meal prep, cleaning, laundry, shopping, occupation, and yard work  PERSONAL FACTORS: 1-2 comorbidities: carpal tunnel and cervicalgia;   are also affecting patient's functional outcome.   REHAB POTENTIAL: Good  CLINICAL DECISION MAKING: Evolving/moderate complexity increasing radicualr symptoms along with multiple body parts   EVALUATION COMPLEXITY: Moderate   GOALS: Goals reviewed with patient? Yes  SHORT TERM GOALS: Target date: 02/13/2023    Patient will increase right grip strength by 5 pounds Baseline:  Goal status: Improving 3/28  2.  Patient will increase bilateral cervical rotation by 10 degrees Baseline:  Goal status: Improving see above 3/28  3.  Patient will have full right internal rotation without feeling of restriction Baseline:  Goal status: still a mild limitation 3/28  4.  Patient will have basic HEP Baseline:   Goal status: achieved 3/28    LONG TERM GOALS: Target date: 03/14/2023    Patient will perform work tasks without increased pain Baseline:  Goal status: INITIAL  2.  Patient will have full program to promote good posture at work Baseline:  Goal status: INITIAL  3.  Patient proper workplace set up Baseline:  Goal status: INITIAL    PLAN:  PT FREQUENCY: 1-2x/week  PT DURATION: 8 weeks  PLANNED INTERVENTIONS: 97110-Therapeutic exercises, 97530- Therapeutic activity, [id number]- Neuromuscular re-education, 97535- Self Care, 54098- Manual therapy, [id number]- Gait training, [phone]- Aquatic Therapy, 97014- Electrical stimulation (unattended), 97035- Ultrasound, Patient/Family education, Taping, Dry Needling, DME instructions, Cryotherapy, and Moist heat   PLAN FOR NEXT SESSION: Review [place] bracing options if patient is gotten bracing.  Consider trigger point dry needling to upper trap and cervical paraspinals.  Continue manual therapy to cervical spine.  Consider seated series of postural exercises.  Progress to gym activities as tolerated.  Patient is a member of the gym. Continue to review gym program.  Lorayne Bender PT DPT 04/17/23 9:18 AM

## 2023-04-16 ENCOUNTER — Telehealth: Admitting: Physician Assistant

## 2023-04-16 ENCOUNTER — Other Ambulatory Visit (HOSPITAL_BASED_OUTPATIENT_CLINIC_OR_DEPARTMENT_OTHER): Payer: Self-pay

## 2023-04-16 DIAGNOSIS — J019 Acute sinusitis, unspecified: Secondary | ICD-10-CM | POA: Diagnosis not present

## 2023-04-16 DIAGNOSIS — B9689 Other specified bacterial agents as the cause of diseases classified elsewhere: Secondary | ICD-10-CM | POA: Diagnosis not present

## 2023-04-16 MED ORDER — DOXYCYCLINE HYCLATE 100 MG PO TABS
100.0000 mg | ORAL_TABLET | Freq: Two times a day (BID) | ORAL | 0 refills | Status: DC
  Filled 2023-04-16: qty 20, 10d supply, fill #0

## 2023-04-16 NOTE — Progress Notes (Signed)
 Virtual Visit Consent   Marie Perez, you are scheduled for a virtual visit with a [facility] provider today. Just as with appointments in the office, your consent must be obtained to participate. Your consent will be active for this visit and any virtual visit you may have with one of our providers in the next 365 days. If you have a MyChart account, a copy of this consent can be sent to you electronically.  As this is a virtual visit, video technology does not allow for your provider to perform a traditional examination. This may limit your provider's ability to fully assess your condition. If your provider identifies any concerns that need to be evaluated in person or the need to arrange testing (such as labs, EKG, etc.), we will make arrangements to do so. Although advances in technology are sophisticated, we cannot ensure that it will always work on either your end or our end. If the connection with a video visit is poor, the visit may have to be switched to a telephone visit. With either a video or telephone visit, we are not always able to ensure that we have a secure connection.  By engaging in this virtual visit, you consent to the provision of healthcare and authorize for your insurance to be billed (if applicable) for the services provided during this visit. Depending on your insurance coverage, you may receive a charge related to this service.  I need to obtain your verbal consent now. Are you willing to proceed with your visit today? Marie Perez has provided verbal consent on 04/16/2023 for a virtual visit (video or telephone). Marie Perez, [place]  Date: 04/16/2023 12:46 PM   Virtual Visit via Video Note   I, Marie Perez, connected with  ROSLYNN HOLTE  ([id number], 27-Sep-1975) on 04/16/23 at 12:45 PM EDT by a video-enabled telemedicine application and verified that I am speaking with the correct person using two identifiers.  Location: Patient: Virtual Visit  Location Patient: Home Provider: Virtual Visit Location Provider: Home Office   I discussed the limitations of evaluation and management by telemedicine and the availability of in person appointments. The patient expressed understanding and agreed to proceed.    History of Present Illness: Marie Perez is a 48 y.o. who identifies as a female who was assigned female at birth, and is being seen today for 1+ weeks of sinus symptoms, acutely worsening over the past few days. Initially with nasal congestion, sinus pressure but now with sinus pain, fatigue. Some chest congestion today with cough. Denies fever, chills. Took home RSV, Flu, COVID tests which were negative. Has been taking her allergy medications with only minimal relief.   HPI: HPI  Problems:  Patient Active Problem List   Diagnosis Date Noted   Hyperlipidemia 03/27/2023   Seasonal allergies 03/27/2023   Chronic constipation 03/27/2023   Vitamin D deficiency 03/27/2023   Musculoskeletal pain 12/24/2010   Sinusitis 05/01/2010   CARPAL TUNNEL SYNDROME, RIGHT 04/05/2010   FATIGUE 02/27/2010   Fatigue 02/27/2010   FREQUENCY, URINARY 11/26/2009   OBESITY, UNSPECIFIED 10/31/2008   Essential hypertension 10/31/2008   UTI 10/31/2008   Vaginitis and vulvovaginitis 10/31/2008   Goiter 01/01/2000    Allergies: No Known Allergies Medications:  Current Outpatient Medications:    doxycycline (VIBRA-TABS) 100 MG tablet, Take 1 tablet (100 mg total) by mouth 2 (two) times daily., Disp: 20 tablet, Rfl: 0   amLODipine (NORVASC) 5 MG tablet, Take 1 tablet (5 mg total) by  mouth daily., Disp: 90 tablet, Rfl: 3   atorvastatin (LIPITOR) 20 MG tablet, Take 1 tablet (20 mg total) by mouth daily., Disp: 30 tablet, Rfl: 0   cetirizine (ZYRTEC) 10 MG tablet, Take 1 tablet by mouth every day, Disp: 90 tablet, Rfl: 3   esomeprazole (NEXIUM) 40 MG capsule, Take 1 capsule (40 mg total) by mouth 2 (two) times daily before a meal., Disp: 60 capsule,  Rfl: 11   famotidine (PEPCID) 40 MG tablet, Take 1 tablet (40 mg total) by mouth at bedtime., Disp: 30 tablet, Rfl: 2   linaclotide (LINZESS) 290 MCG CAPS capsule, Take 1 capsule (290 mcg total) by mouth daily at least 30 minutes before the first meal of the day on an empty stomach., Disp: 30 capsule, Rfl: 0   montelukast (SINGULAIR) 10 MG tablet, Take 1 tablet (10 mg total) by mouth daily. (Patient taking differently: Take 10 mg by mouth as needed.), Disp: 90 tablet, Rfl: 3   nortriptyline (PAMELOR) 10 MG capsule, Take 3 capsules (30 mg total) by mouth at bedtime., Disp: 90 capsule, Rfl: 5   senna (SENOKOT) 8.6 MG TABS tablet, Take 1 tablet by mouth daily as needed for mild constipation., Disp: , Rfl:    tirzepatide (MOUNJARO) 7.5 MG/0.5ML Pen, Inject 7.5 mg into the skin once a week., Disp: 2 mL, Rfl: 1   valsartan-hydrochlorothiazide (DIOVAN-HCT) 320-12.5 MG tablet, Take 1 tablet by mouth daily., Disp: 90 tablet, Rfl: 3   Vitamin D, Ergocalciferol, 50000 units CAPS, Take 1 capsule by mouth once a week., Disp: 12 capsule, Rfl: 3  Observations/Objective: Patient is well-developed, well-nourished in no acute distress.  Resting comfortably at home.  Head is normocephalic, atraumatic.  No labored breathing. Speech is clear and coherent with logical content.  Patient is alert and oriented at baseline.   Assessment and Plan: 1. Acute bacterial sinusitis (Primary) - doxycycline (VIBRA-TABS) 100 MG tablet; Take 1 tablet (100 mg total) by mouth 2 (two) times daily.  Dispense: 20 tablet; Refill: 0  Rx Doxycycline.  Increase fluids.  Rest.  Saline nasal spray.  Probiotic.  Mucinex as directed.  Humidifier in bedroom. Continue Flonase.  Call or return to clinic if symptoms are not improving.   Follow Up Instructions: I discussed the assessment and treatment plan with the patient. The patient was provided an opportunity to ask questions and all were answered. The patient agreed with the plan and  demonstrated an understanding of the instructions.  A copy of instructions were sent to the patient via MyChart unless otherwise noted below.   The patient was advised to call back or seek an in-person evaluation if the symptoms worsen or if the condition fails to improve as anticipated.    Marie Climes, PA-C

## 2023-04-16 NOTE — Patient Instructions (Signed)
 Marie Perez, thank you for joining [name], PA-C for today's virtual visit.  While this provider is not your primary care provider (PCP), if your PCP is located in our provider database this encounter information will be shared with them immediately following your visit.   A [place] MyChart account gives you access to today's visit and all your visits, tests, and labs performed at [facility] " click here if you don't have a [place] MyChart account or go to [email or web address]  Consent: (Patient) Marie Perez provided verbal consent for this virtual visit at the beginning of the encounter.  Current Medications:  Current Outpatient Medications:    amLODipine (NORVASC) 5 MG tablet, Take 1 tablet (5 mg total) by mouth daily., Disp: 90 tablet, Rfl: 3   atorvastatin (LIPITOR) 20 MG tablet, Take 1 tablet (20 mg total) by mouth daily., Disp: 30 tablet, Rfl: 0   cetirizine (ZYRTEC) 10 MG tablet, Take 1 tablet by mouth every day, Disp: 90 tablet, Rfl: 3   esomeprazole (NEXIUM) 40 MG capsule, Take 1 capsule (40 mg total) by mouth 2 (two) times daily before a meal., Disp: 60 capsule, Rfl: 11   famotidine (PEPCID) 40 MG tablet, Take 1 tablet (40 mg total) by mouth at bedtime., Disp: 30 tablet, Rfl: 2   linaclotide (LINZESS) 290 MCG CAPS capsule, Take 1 capsule (290 mcg total) by mouth daily at least 30 minutes before the first meal of the day on an empty stomach., Disp: 30 capsule, Rfl: 0   montelukast (SINGULAIR) 10 MG tablet, Take 1 tablet (10 mg total) by mouth daily. (Patient taking differently: Take 10 mg by mouth as needed.), Disp: 90 tablet, Rfl: 3   nortriptyline (PAMELOR) 10 MG capsule, Take 3 capsules (30 mg total) by mouth at bedtime., Disp: 90 capsule, Rfl: 5   senna (SENOKOT) 8.6 MG TABS tablet, Take 1 tablet by mouth daily as needed for mild constipation., Disp: , Rfl:    tirzepatide (MOUNJARO) 7.5 MG/0.5ML Pen, Inject 7.5 mg into the skin once a  week., Disp: 2 mL, Rfl: 1   valsartan-hydrochlorothiazide (DIOVAN-HCT) 320-12.5 MG tablet, Take 1 tablet by mouth daily., Disp: 90 tablet, Rfl: 3   Vitamin D, Ergocalciferol, 50000 units CAPS, Take 1 capsule by mouth once a week., Disp: 12 capsule, Rfl: 3   Medications ordered in this encounter:  No orders of the defined types were placed in this encounter.    *If you need refills on other medications prior to your next appointment, please contact your pharmacy*  Follow-Up: Call back or seek an in-person evaluation if the symptoms worsen or if the condition fails to improve as anticipated.  [facility] [phone]  Other Instructions Please take antibiotic as directed.  Increase fluid intake.  Use Saline nasal spray.  Take a daily multivitamin. Use your Flonase as directed.  Place a humidifier in the bedroom.  Please call or return clinic if symptoms are not improving.  Sinusitis Sinusitis is redness, soreness, and swelling (inflammation) of the paranasal sinuses. Paranasal sinuses are air pockets within the bones of your face (beneath the eyes, the middle of the forehead, or above the eyes). In healthy paranasal sinuses, mucus is able to drain out, and air is able to circulate through them by way of your nose. However, when your paranasal sinuses are inflamed, mucus and air can become trapped. This can allow bacteria and other germs to grow and cause infection. Sinusitis can develop quickly and last  only a short time (acute) or continue over a long period (chronic). Sinusitis that lasts for more than 12 weeks is considered chronic.  CAUSES  Causes of sinusitis include: Allergies. Structural abnormalities, such as displacement of the cartilage that separates your nostrils (deviated septum), which can decrease the air flow through your nose and sinuses and affect sinus drainage. Functional abnormalities, such as when the small hairs (cilia) that line your sinuses and help  remove mucus do not work properly or are not present. SYMPTOMS  Symptoms of acute and chronic sinusitis are the same. The primary symptoms are pain and pressure around the affected sinuses. Other symptoms include: Upper toothache. Earache. Headache. Bad breath. Decreased sense of smell and taste. A cough, which worsens when you are lying flat. Fatigue. Fever. Thick drainage from your nose, which often is green and may contain pus (purulent). Swelling and warmth over the affected sinuses. DIAGNOSIS  Your caregiver will perform a physical exam. During the exam, your caregiver may: Look in your nose for signs of abnormal growths in your nostrils (nasal polyps). Tap over the affected sinus to check for signs of infection. View the inside of your sinuses (endoscopy) with a special imaging device with a light attached (endoscope), which is inserted into your sinuses. If your caregiver suspects that you have chronic sinusitis, one or more of the following tests may be recommended: Allergy tests. Nasal culture A sample of mucus is taken from your nose and sent to a lab and screened for bacteria. Nasal cytology A sample of mucus is taken from your nose and examined by your caregiver to determine if your sinusitis is related to an allergy. TREATMENT  Most cases of acute sinusitis are related to a viral infection and will resolve on their own within 10 days. Sometimes medicines are prescribed to help relieve symptoms (pain medicine, decongestants, nasal steroid sprays, or saline sprays).  However, for sinusitis related to a bacterial infection, your caregiver will prescribe antibiotic medicines. These are medicines that will help kill the bacteria causing the infection.  Rarely, sinusitis is caused by a fungal infection. In theses cases, your caregiver will prescribe antifungal medicine. For some cases of chronic sinusitis, surgery is needed. Generally, these are cases in which sinusitis recurs more  than 3 times per year, despite other treatments. HOME CARE INSTRUCTIONS  Drink plenty of water. Water helps thin the mucus so your sinuses can drain more easily. Use a humidifier. Inhale steam 3 to 4 times a day (for example, sit in the bathroom with the shower running). Apply a warm, moist washcloth to your face 3 to 4 times a day, or as directed by your caregiver. Use saline nasal sprays to help moisten and clean your sinuses. Take over-the-counter or prescription medicines for pain, discomfort, or fever only as directed by your caregiver. SEEK IMMEDIATE MEDICAL CARE IF: You have increasing pain or severe headaches. You have nausea, vomiting, or drowsiness. You have swelling around your face. You have vision problems. You have a stiff neck. You have difficulty breathing. MAKE SURE YOU:  Understand these instructions. Will watch your condition. Will get help right away if you are not doing well or get worse. Document Released: 01/06/2005 Document Revised: 03/31/2011 Document Reviewed: 01/21/2011 [facility] Patient Information 2014 [place], [place].    If you have been instructed to have an in-person evaluation today at a local Urgent Care facility, please use the link below. It will take you to a list of all of our available [facility]  Urgent Cares, including address, phone number and hours of operation. Please do not delay care.  [place] [facility]  If you or a family member do not have a primary care provider, use the link below to schedule a visit and establish care. When you choose a [facility] primary care physician or advanced practice provider, you gain a long-term partner in health. Find a Primary Care Provider  Learn more about [place]'s in-office and virtual care options: [place] - Get Care Now

## 2023-04-17 ENCOUNTER — Encounter (HOSPITAL_BASED_OUTPATIENT_CLINIC_OR_DEPARTMENT_OTHER): Payer: Self-pay | Admitting: Physical Therapy

## 2023-04-17 ENCOUNTER — Ambulatory Visit (HOSPITAL_BASED_OUTPATIENT_CLINIC_OR_DEPARTMENT_OTHER)
Admission: RE | Admit: 2023-04-17 | Discharge: 2023-04-17 | Disposition: A | Discharge status: Home / Self Care | Source: Ambulatory Visit | Attending: Primary Care | Admitting: Primary Care

## 2023-04-17 DIAGNOSIS — R0683 Snoring: Secondary | ICD-10-CM | POA: Diagnosis not present

## 2023-04-17 DIAGNOSIS — E049 Nontoxic goiter, unspecified: Secondary | ICD-10-CM | POA: Insufficient documentation

## 2023-04-17 DIAGNOSIS — R7989 Other specified abnormal findings of blood chemistry: Secondary | ICD-10-CM | POA: Diagnosis not present

## 2023-04-17 DIAGNOSIS — R5382 Chronic fatigue, unspecified: Secondary | ICD-10-CM | POA: Diagnosis not present

## 2023-04-17 DIAGNOSIS — E042 Nontoxic multinodular goiter: Secondary | ICD-10-CM | POA: Diagnosis not present

## 2023-04-23 ENCOUNTER — Other Ambulatory Visit (HOSPITAL_COMMUNITY)

## 2023-04-27 NOTE — Progress Notes (Signed)
 Thyroid ultrasound showed mildly enlarged thyroid gland containing tiny thyroid cysts and nodules.  No individual nodule meets criteria to recommend biopsy or imaging follow-up.  Follow up with PCP regularly

## 2023-04-28 ENCOUNTER — Telehealth (HOSPITAL_BASED_OUTPATIENT_CLINIC_OR_DEPARTMENT_OTHER): Payer: Self-pay | Admitting: Primary Care

## 2023-04-28 NOTE — Telephone Encounter (Signed)
 Atc pt. Lmtcb. Completing this note as it is a duplicate.

## 2023-04-28 NOTE — Telephone Encounter (Signed)
 Patient calling back in regards to a call received with results of Ultrasound. Please advise and send patient a MyChart message with results if possible- having issues with her phone

## 2023-04-30 ENCOUNTER — Other Ambulatory Visit (HOSPITAL_BASED_OUTPATIENT_CLINIC_OR_DEPARTMENT_OTHER): Payer: Self-pay

## 2023-04-30 ENCOUNTER — Other Ambulatory Visit: Payer: Self-pay | Admitting: Family Medicine

## 2023-04-30 ENCOUNTER — Ambulatory Visit: Admitting: Family Medicine

## 2023-04-30 VITALS — BP 116/82 | HR 100 | Temp 98.9°F | Wt 236.0 lb

## 2023-04-30 DIAGNOSIS — B372 Candidiasis of skin and nail: Secondary | ICD-10-CM | POA: Diagnosis not present

## 2023-04-30 DIAGNOSIS — R2231 Localized swelling, mass and lump, right upper limb: Secondary | ICD-10-CM

## 2023-04-30 MED ORDER — NYSTATIN 100000 UNIT/GM EX POWD
1.0000 | Freq: Three times a day (TID) | CUTANEOUS | 1 refills | Status: AC
  Filled 2023-04-30: qty 60, 30d supply, fill #0
  Filled 2023-09-11: qty 60, 30d supply, fill #1

## 2023-04-30 NOTE — Progress Notes (Signed)
   Established Patient Office Visit   Subjective:  Patient ID: Marie Perez, female    DOB: 04-04-1975  Age: 48 y.o. MRN: [id number]  Chief Complaint  Patient presents with   Mass    HPI On Monday, she noticed a knot in the arm pit. She reports she has noticed it every day since. She reports the area is painful. She initially was palpating the area because her shoulder was painful. Also, has a rash, like heat rash, under right breast. Patient reports she has been applying some old Nystatin cream underneath her right breast.  ROS See HPI above     Objective:   BP 116/82   Pulse 100   Temp 98.9 F (37.2 C) (Oral)   Wt 236 lb (107 kg)   SpO2 97%   BMI 39.27 kg/m    Physical Exam Vitals reviewed.  Constitutional:      General: She is not in acute distress.    Appearance: Normal appearance. She is not ill-appearing, toxic-appearing or diaphoretic.  HENT:     Head: Normocephalic and atraumatic.  Eyes:     General:        Right eye: No discharge.        Left eye: No discharge.     Conjunctiva/sclera: Conjunctivae normal.  Cardiovascular:     Rate and Rhythm: Normal rate and regular rhythm.     Heart sounds: Normal heart sounds. No murmur heard.    No friction rub. No gallop.  Pulmonary:     Effort: Pulmonary effort is normal. No respiratory distress.     Breath sounds: Normal breath sounds.  Chest:       Comments: Small palpable mass that is non-moveable. Tenderness around the area about 2 inches from the palpable mass. No changes in skin where it is tender and mass found.  Musculoskeletal:        General: Normal range of motion.  Skin:    General: Skin is warm and dry.     Comments: Moist underneath right breast. No skin breakdown.   Neurological:     General: No focal deficit present.     Mental Status: She is alert and oriented to person, place, and time. Mental status is at baseline.  Psychiatric:        Mood and Affect: Mood normal.        Behavior:  Behavior normal.        Thought Content: Thought content normal.        Judgment: Judgment normal.      Assessment & Plan:  Axillary mass, right -     MS [place] AXILLA RIGHT; Future  Skin yeast infection -     Nystatin; Apply 1 Application topically 3 (three) times daily.  Dispense: 60 g; Refill: 1  -Ordered Nystatin powder to apply to moist area underneath breast. Discontinue using the old Nystatin cream. -Ordered soft tissue axilla ultrasound to evaluate the mass. Please call back or send a MyChart if you don't hear back about an appointment in the next week.   Zandra Abts, NP

## 2023-04-30 NOTE — Patient Instructions (Addendum)
-  Ordered Nystatin powder to apply to moist area underneath breast. Discontinue using the old Nystatin cream. -Ordered soft tissue axilla ultrasound to evaluate the mass. Please call back or send a MyChart if you don't hear back about an appointment in the next week.

## 2023-05-01 ENCOUNTER — Other Ambulatory Visit (HOSPITAL_COMMUNITY)

## 2023-05-01 DIAGNOSIS — F509 Eating disorder, unspecified: Secondary | ICD-10-CM | POA: Diagnosis not present

## 2023-05-01 DIAGNOSIS — F59 Unspecified behavioral syndromes associated with physiological disturbances and physical factors: Secondary | ICD-10-CM | POA: Diagnosis not present

## 2023-05-01 DIAGNOSIS — F1090 Alcohol use, unspecified, uncomplicated: Secondary | ICD-10-CM | POA: Diagnosis not present

## 2023-05-01 DIAGNOSIS — E669 Obesity, unspecified: Secondary | ICD-10-CM | POA: Diagnosis not present

## 2023-05-01 DIAGNOSIS — Z713 Dietary counseling and surveillance: Secondary | ICD-10-CM | POA: Diagnosis not present

## 2023-05-05 ENCOUNTER — Ambulatory Visit
Admission: RE | Admit: 2023-05-05 | Discharge: 2023-05-05 | Discharge status: Home / Self Care | Source: Ambulatory Visit | Attending: Family Medicine

## 2023-05-05 ENCOUNTER — Encounter: Payer: Self-pay | Admitting: Family Medicine

## 2023-05-05 ENCOUNTER — Ambulatory Visit
Admission: RE | Admit: 2023-05-05 | Discharge: 2023-05-05 | Disposition: A | Discharge status: Home / Self Care | Source: Ambulatory Visit | Attending: Family Medicine | Admitting: Family Medicine

## 2023-05-05 DIAGNOSIS — R2231 Localized swelling, mass and lump, right upper limb: Secondary | ICD-10-CM

## 2023-05-05 DIAGNOSIS — N6331 Unspecified lump in axillary tail of the right breast: Secondary | ICD-10-CM | POA: Diagnosis not present

## 2023-05-06 ENCOUNTER — Other Ambulatory Visit: Payer: Self-pay | Admitting: Family Medicine

## 2023-05-06 ENCOUNTER — Other Ambulatory Visit (HOSPITAL_BASED_OUTPATIENT_CLINIC_OR_DEPARTMENT_OTHER): Payer: Self-pay

## 2023-05-06 MED ORDER — LINACLOTIDE 290 MCG PO CAPS
290.0000 ug | ORAL_CAPSULE | Freq: Every day | ORAL | 1 refills | Status: AC
  Filled 2023-05-06: qty 90, 90d supply, fill #0
  Filled 2023-08-25: qty 90, 90d supply, fill #1

## 2023-05-07 ENCOUNTER — Ambulatory Visit (HOSPITAL_COMMUNITY)
Admission: RE | Admit: 2023-05-07 | Discharge: 2023-05-07 | Disposition: A | Discharge status: Home / Self Care | Source: Ambulatory Visit | Attending: Gastroenterology | Admitting: Gastroenterology

## 2023-05-07 ENCOUNTER — Other Ambulatory Visit: Payer: Self-pay | Admitting: Gastroenterology

## 2023-05-07 DIAGNOSIS — K219 Gastro-esophageal reflux disease without esophagitis: Secondary | ICD-10-CM

## 2023-05-07 DIAGNOSIS — R1310 Dysphagia, unspecified: Secondary | ICD-10-CM | POA: Diagnosis not present

## 2023-05-07 DIAGNOSIS — K5909 Other constipation: Secondary | ICD-10-CM

## 2023-05-07 DIAGNOSIS — K449 Diaphragmatic hernia without obstruction or gangrene: Secondary | ICD-10-CM | POA: Diagnosis not present

## 2023-05-07 DIAGNOSIS — E0590 Thyrotoxicosis, unspecified without thyrotoxic crisis or storm: Secondary | ICD-10-CM

## 2023-05-07 DIAGNOSIS — K222 Esophageal obstruction: Secondary | ICD-10-CM

## 2023-05-09 ENCOUNTER — Other Ambulatory Visit (HOSPITAL_BASED_OUTPATIENT_CLINIC_OR_DEPARTMENT_OTHER): Payer: Self-pay

## 2023-05-09 ENCOUNTER — Encounter (HOSPITAL_BASED_OUTPATIENT_CLINIC_OR_DEPARTMENT_OTHER): Payer: Self-pay | Admitting: Physical Therapy

## 2023-05-09 MED ORDER — ATORVASTATIN CALCIUM 20 MG PO TABS
20.0000 mg | ORAL_TABLET | Freq: Every day | ORAL | 0 refills | Status: DC
  Filled 2023-05-09: qty 14, 14d supply, fill #0

## 2023-05-13 ENCOUNTER — Other Ambulatory Visit

## 2023-05-13 ENCOUNTER — Encounter

## 2023-05-14 ENCOUNTER — Ambulatory Visit: Payer: Commercial Managed Care - PPO | Admitting: Neurology

## 2023-05-15 ENCOUNTER — Encounter (HOSPITAL_BASED_OUTPATIENT_CLINIC_OR_DEPARTMENT_OTHER): Admitting: Physical Therapy

## 2023-05-21 ENCOUNTER — Other Ambulatory Visit

## 2023-05-21 ENCOUNTER — Ambulatory Visit: Admitting: "Endocrinology

## 2023-05-21 ENCOUNTER — Encounter: Payer: Self-pay | Admitting: "Endocrinology

## 2023-05-21 VITALS — BP 124/86 | HR 65 | Ht 65.0 in | Wt 231.0 lb

## 2023-05-21 DIAGNOSIS — E042 Nontoxic multinodular goiter: Secondary | ICD-10-CM | POA: Diagnosis not present

## 2023-05-21 DIAGNOSIS — E0590 Thyrotoxicosis, unspecified without thyrotoxic crisis or storm: Secondary | ICD-10-CM | POA: Diagnosis not present

## 2023-05-21 NOTE — Progress Notes (Signed)
 Outpatient Endocrinology Note Marie Newcomer, MD  05/21/23   Marie Perez 15-Jun-1975 [id number]  Referring Provider: Francenia Ingle, NP Primary Care Provider: Francenia Ingle, NP Subjective  No chief complaint on file.   Assessment & Plan  Diagnoses and all orders for this visit:  Hyperthyroidism -     TSH -     T3, free -     T4, free -     TRAb (TSH Receptor Binding Antibody) -     Thyroid  stimulating immunoglobulin  Multinodular goiter    Marie Perez is currently not taking any thyroid  medication Patient was last biochemically hyperthyroid.  Educated on thyroid  axis.  Recommend the following: Labs today.  Discussed about methimazole  at length including side effects and handout in case needed to start. Repeat lab before next visit or sooner if symptoms of hyperthyroidism or hypothyroidism develop.  Notify us  immediately in case of significant weight gain or loss. Counseled on compliance and follow up needs.  03/2023 thyroid  ultrasound images and report reviewed Patient has multinodular goiter with none of the nodules meeting criteria for FNA at this point Recommend follow-up ultrasound in 1 year   I have reviewed current medications, nurse's notes, allergies, vital signs, past medical and surgical history, family medical history, and social history for this encounter. Counseled patient on symptoms, examination findings, lab findings, imaging results, treatment decisions and monitoring and prognosis. The patient understood the recommendations and agrees with the treatment plan. All questions regarding treatment plan were fully answered.   No follow-ups on file.   Marie Newcomer, MD  05/21/23   I have reviewed current medications, nurse's notes, allergies, vital signs, past medical and surgical history, family medical history, and social history for this encounter. Counseled patient on symptoms, examination findings, lab findings, imaging  results, treatment decisions and monitoring and prognosis. The patient understood the recommendations and agrees with the treatment plan. All questions regarding treatment plan were fully answered.   History of Present Illness Marie Perez is a 48 y.o. year old female who presents to our clinic with hyperthyroidism diagnosed in 12/2022.    Symptoms suggestive of HYPOTHYROIDISM:  fatigue Yes weight gain No cold intolerance  No constipation  Yes  Symptoms suggestive of HYPERTHYROIDISM:  weight loss  Yes, on mounjaro   heat intolerance Yes hyperdefecation  No palpitations  Yes  Compressive symptoms:  dysphagia  No dysphonia  No positional dyspnea (especially with simultaneous arms elevation)  No  Smokes  No On biotin  No Personal history of head/neck surgery/irradiation  No  Grave's Ophthalmopathy Clinical Activity Score: 0/9  03/2023 THYROID  ULTRASOUND   TECHNIQUE: Ultrasound examination of the thyroid  gland and adjacent soft tissues was performed.   COMPARISON:  None Available.   FINDINGS: Parenchymal Echotexture: Mildly heterogenous   Isthmus: 0.9 cm   Right lobe: 6.7 x 3.3 x 2.2 cm   Left lobe: 6.1 x 2.4 x 2.3 cm   _________________________________________________________   Estimated total number of nodules >/= 1 cm: 4   Number of spongiform nodules >/=  2 cm not described below (TR1): 0   Number of mixed cystic and solid nodules >/= 1.5 cm not described below (TR2): 0   _________________________________________________________   Heterogeneous and enlarged thyroid  gland containing nearly innumerable tiny cysts and nodules. The vast majority of the lesions are less than 5 mm in size and warrant no discrete description. Additional nodules as below.   Nodule # 1: Isoechoic solid nodule in the  right upper gland measures 1.3 x 0.8 x 1.1 cm. Findings are consistent with TI-RADS category 3. Given size (<1.4 cm) and appearance, this nodule does NOT  meet TI-RADS criteria for biopsy or dedicated follow-up.   Nodule # 2: Mixed cystic and solid nodule in the right mid gland. TI-RADS category 2. This nodule does NOT meet TI-RADS criteria for biopsy or dedicated follow-up.   Nodule # 3: Spongiform nodule in the left mid gland measures 1.1 cm. TI-RADS category 2. This nodule does NOT meet TI-RADS criteria for biopsy or dedicated follow-up.   Nodule # 4: Mixed cystic and solid nodule in the left lower gland measures up to 1.1 cm. TI-RADS category 2. This nodule does NOT meet TI-RADS criteria for biopsy or dedicated follow-up.   IMPRESSION: 1. Mildly enlarged thyroid  gland containing nearly innumerable tiny thyroid  cysts and nodules. 2. No individual nodule meets criteria to recommend biopsy or imaging follow-up.  Physical Exam  BP 124/86   Pulse 65   Ht 5\' 5"  (1.651 m)   Wt 231 lb (104.8 kg)   SpO2 98%   BMI 38.44 kg/m  Constitutional: well developed, well nourished Head: normocephalic, atraumatic, no exophthalmos Eyes: sclera anicteric, no redness Neck: + thyromegaly, no thyroid  tenderness; no nodules palpated Lungs: normal respiratory effort Neurology: alert and oriented, no fine hand tremor Skin: dry, no appreciable rashes Musculoskeletal: no appreciable defects Psychiatric: normal mood and affect  Allergies No Known Allergies  Current Medications Patient's Medications  New Prescriptions   No medications on file  Previous Medications   AMLODIPINE  (NORVASC ) 5 MG TABLET    Take 1 tablet (5 mg total) by mouth daily.   ATORVASTATIN  (LIPITOR) 20 MG TABLET    Take 1 tablet (20 mg total) by mouth daily for 14 day.   CETIRIZINE  (ZYRTEC ) 10 MG TABLET    Take 1 tablet by mouth every day   ESOMEPRAZOLE  (NEXIUM ) 40 MG CAPSULE    Take 1 capsule (40 mg total) by mouth 2 (two) times daily before a meal.   FAMOTIDINE  (PEPCID ) 40 MG TABLET    Take 1 tablet (40 mg total) by mouth at bedtime.   LINACLOTIDE  (LINZESS ) 290 MCG CAPS  CAPSULE    Take 1 capsule (290 mcg total) by mouth daily at least 30 minutes before the first meal of the day on an empty stomach.   MONTELUKAST  (SINGULAIR ) 10 MG TABLET    Take 1 tablet (10 mg total) by mouth daily.   NYSTATIN  POWDER    Apply 1 Application topically 3 (three) times daily.   SENNA (SENOKOT) 8.6 MG TABS TABLET    Take 1 tablet by mouth daily as needed for mild constipation.   TIRZEPATIDE  (MOUNJARO ) 7.5 MG/0.5ML PEN    Inject 7.5 mg into the skin once a week.   VALSARTAN -HYDROCHLOROTHIAZIDE  (DIOVAN -HCT) 320-12.5 MG TABLET    Take 1 tablet by mouth daily.   VITAMIN D , ERGOCALCIFEROL , 50000 UNITS CAPS    Take 1 capsule by mouth once a week.  Modified Medications   No medications on file  Discontinued Medications   No medications on file    Past Medical History Past Medical History:  Diagnosis Date   Hyperlipidemia    Hypertension    Vitamin D  deficiency     Past Surgical History Past Surgical History:  Procedure Laterality Date   CESAREAN SECTION  01/21/2008   COLONOSCOPY     UPPER GI ENDOSCOPY      Family History family history includes Diabetes in her  father; High Cholesterol in her mother; Hypertension in her mother.  Social History Social History   Socioeconomic History   Marital status: Married    Spouse name: Not on file   Number of children: 1   Years of education: Not on file   Highest education level: Associate degree: occupational, Scientist, product/process development, or vocational program  Occupational History   Occupation: Archivist  Tobacco Use   Smoking status: Never   Smokeless tobacco: Never  Vaping Use   Vaping status: Never Used  Substance and Sexual Activity   Alcohol use: No   Drug use: No   Sexual activity: Yes    Birth control/protection: None  Other Topics Concern   Not on file  Social History Narrative   Are you right handed or left handed? Right Handed   Are you currently employed ? 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 No   Who lives with you? Husband   What type of home do you live in: 1 story or 2 story? Lives in a one story home       Social Drivers of Health   Financial Resource Strain: Low Risk  (01/15/2023)   Overall Financial Resource Strain (CARDIA)    Difficulty of Paying Living Expenses: Not hard at all  Food Insecurity: No Food Insecurity (01/15/2023)   Hunger Vital Sign    Worried About Running Out of Food in the Last Year: Never true    Ran Out of Food in the Last Year: Never true  Transportation Needs: No Transportation Needs (01/15/2023)   PRAPARE - Administrator, Civil Service (Medical): No    Lack of Transportation (Non-Medical): No  Physical Activity: Insufficiently Active (01/15/2023)   Exercise Vital Sign    Days of Exercise per Week: 2 days    Minutes of Exercise per Session: 10 min  Stress: No Stress Concern Present (01/15/2023)   [employer] of Occupational Health - Occupational Stress Questionnaire    Feeling of Stress : Not at all  Social Connections: Moderately Integrated (01/15/2023)   Social Connection and Isolation Panel [NHANES]    Frequency of Communication with Friends and Family: Twice a week    Frequency of Social Gatherings with Friends and Family: Once a week    Attends Religious Services: More than 4 times per year    Active Member of [employer] or Organizations: No    Attends Banker Meetings: Not on file    Marital Status: Married  Intimate Partner Violence: Not At Risk (03/27/2023)   Humiliation, Afraid, Rape, and Kick questionnaire    Fear of Current or Ex-Partner: No    Emotionally Abused: No    Physically Abused: No    Sexually Abused: No    Laboratory Investigations Lab Results  Component Value Date   TSH <0.005 (L) 03/27/2023   TSH 0.025 (L) 01/19/2023   TSH 0.04 (L) 01/09/2023   FREET4 2.66 (H) 03/27/2023   FREET4 1.61 01/19/2023     No results found for:  "TSI"   No components found for: "TRAB"   Lab Results  Component Value Date   CHOL 131 03/27/2023   Lab Results  Component Value Date   HDL 35 (L) 03/27/2023   Lab Results  Component Value Date   LDLCALC 82 03/27/2023   Lab Results  Component Value Date   TRIG 66 03/27/2023   Lab Results  Component Value Date   CHOLHDL 3.7 03/27/2023   Lab Results  Component Value Date   CREATININE 0.92 04/09/2023   Lab Results  Component Value Date   GFR 73.43 04/09/2023      Component Value Date/Time   NA 136 04/09/2023 0832   NA 137 03/27/2023 1317   K 3.7 04/09/2023 0832   CL 103 04/09/2023 0832   CO2 26 04/09/2023 0832   GLUCOSE 97 04/09/2023 0832   BUN 12 04/09/2023 0832   BUN 9 03/27/2023 1317   CREATININE 0.92 04/09/2023 0832   CALCIUM  9.6 04/09/2023 0832   PROT 6.9 03/27/2023 1317   ALBUMIN 4.1 03/27/2023 1317   AST 25 03/27/2023 1317   ALT 17 03/27/2023 1317   ALKPHOS 67 03/27/2023 1317   BILITOT 0.5 03/27/2023 1317   GFRNONAA 54 (L) 01/23/2020 1607   GFRAA 62 01/23/2020 1607      Latest Ref Rng & Units 04/09/2023    8:32 AM 03/27/2023    1:17 PM 11/25/2022    8:10 AM  BMP  Glucose 70 - 99 mg/dL 97  91  94   BUN 6 - 23 mg/dL 12  9  12    Creatinine 0.40 - 1.20 mg/dL 4.69  6.29  5.28   BUN/Creat Ratio 9 - 23  8  11    Sodium 135 - 145 mEq/L 136  137  139   Potassium 3.5 - 5.1 mEq/L 3.7  4.0  4.0   Chloride 96 - 112 mEq/L 103  104  102   CO2 19 - 32 mEq/L 26  18  21    Calcium  8.4 - 10.5 mg/dL 9.6  9.5  9.2        Component Value Date/Time   WBC 6.5 03/27/2023 1317   WBC 9.4 04/10/2020 1817   RBC 4.78 03/27/2023 1317   RBC 4.90 04/10/2020 1817   HGB 13.6 03/27/2023 1317   HCT 42.3 03/27/2023 1317   PLT 290 03/27/2023 1317   MCV 89 03/27/2023 1317   MCH 28.5 03/27/2023 1317   MCH 29.6 04/10/2020 1817   MCHC 32.2 03/27/2023 1317   MCHC 34.1 04/10/2020 1817   RDW 13.9 03/27/2023 1317   LYMPHSABS 1.9 03/27/2023 1317   MONOABS 0.6 04/10/2020 1817    EOSABS 0.0 03/27/2023 1317   BASOSABS 0.0 03/27/2023 1317      Parts of this note may have been dictated using voice recognition software. There may be variances in spelling and vocabulary which are unintentional. Not all errors are proofread. Please notify the [facility] if any discrepancies are noted or if the meaning of any statement is not clear.

## 2023-05-22 ENCOUNTER — Other Ambulatory Visit: Payer: Self-pay | Admitting: "Endocrinology

## 2023-05-22 ENCOUNTER — Other Ambulatory Visit: Payer: Self-pay

## 2023-05-22 ENCOUNTER — Encounter: Payer: Self-pay | Admitting: "Endocrinology

## 2023-05-22 ENCOUNTER — Other Ambulatory Visit (HOSPITAL_BASED_OUTPATIENT_CLINIC_OR_DEPARTMENT_OTHER): Payer: Self-pay

## 2023-05-22 DIAGNOSIS — E0590 Thyrotoxicosis, unspecified without thyrotoxic crisis or storm: Secondary | ICD-10-CM

## 2023-05-22 MED ORDER — METHIMAZOLE 10 MG PO TABS
10.0000 mg | ORAL_TABLET | Freq: Three times a day (TID) | ORAL | 1 refills | Status: DC
  Filled 2023-05-22: qty 90, 30d supply, fill #0
  Filled 2023-06-24: qty 90, 30d supply, fill #1

## 2023-05-22 MED ORDER — ATORVASTATIN CALCIUM 20 MG PO TABS
20.0000 mg | ORAL_TABLET | Freq: Every day | ORAL | 0 refills | Status: DC
  Filled 2023-05-22: qty 14, 14d supply, fill #0

## 2023-05-25 ENCOUNTER — Encounter: Payer: Self-pay | Admitting: Family Medicine

## 2023-05-25 ENCOUNTER — Other Ambulatory Visit (HOSPITAL_BASED_OUTPATIENT_CLINIC_OR_DEPARTMENT_OTHER): Payer: Self-pay

## 2023-05-25 DIAGNOSIS — L309 Dermatitis, unspecified: Secondary | ICD-10-CM

## 2023-05-25 DIAGNOSIS — R7303 Prediabetes: Secondary | ICD-10-CM

## 2023-05-25 DIAGNOSIS — N289 Disorder of kidney and ureter, unspecified: Secondary | ICD-10-CM

## 2023-05-25 MED ORDER — TRIAMCINOLONE ACETONIDE 0.1 % EX OINT
1.0000 | TOPICAL_OINTMENT | Freq: Two times a day (BID) | CUTANEOUS | 0 refills | Status: AC
Start: 2023-05-25 — End: 2023-06-03
  Filled 2023-05-25: qty 15, 8d supply, fill #0

## 2023-05-26 ENCOUNTER — Ambulatory Visit: Admitting: "Endocrinology

## 2023-05-26 LAB — TRAB (TSH RECEPTOR BINDING ANTIBODY): TRAB: 5.75 IU/L — ABNORMAL HIGH (ref <=2.00)

## 2023-05-26 LAB — THYROID STIMULATING IMMUNOGLOBULIN: TSI: 209 %{baseline} — ABNORMAL HIGH (ref <140)

## 2023-05-26 LAB — T4, FREE: Free T4: 2.9 ng/dL — ABNORMAL HIGH (ref 0.8–1.8)

## 2023-05-26 LAB — T3, FREE: T3, Free: 11.2 pg/mL — ABNORMAL HIGH (ref 2.3–4.2)

## 2023-05-26 LAB — TSH: TSH: <0.01 m[IU]/L — ABNORMAL LOW

## 2023-05-29 DIAGNOSIS — Z713 Dietary counseling and surveillance: Secondary | ICD-10-CM | POA: Diagnosis not present

## 2023-05-29 DIAGNOSIS — E669 Obesity, unspecified: Secondary | ICD-10-CM | POA: Diagnosis not present

## 2023-05-29 DIAGNOSIS — F59 Unspecified behavioral syndromes associated with physiological disturbances and physical factors: Secondary | ICD-10-CM | POA: Diagnosis not present

## 2023-05-29 DIAGNOSIS — F1090 Alcohol use, unspecified, uncomplicated: Secondary | ICD-10-CM | POA: Diagnosis not present

## 2023-05-29 DIAGNOSIS — F509 Eating disorder, unspecified: Secondary | ICD-10-CM | POA: Diagnosis not present

## 2023-05-30 ENCOUNTER — Other Ambulatory Visit (HOSPITAL_BASED_OUTPATIENT_CLINIC_OR_DEPARTMENT_OTHER): Payer: Self-pay

## 2023-06-01 ENCOUNTER — Other Ambulatory Visit (HOSPITAL_BASED_OUTPATIENT_CLINIC_OR_DEPARTMENT_OTHER): Payer: Self-pay

## 2023-06-01 MED ORDER — MOUNJARO 7.5 MG/0.5ML ~~LOC~~ SOAJ
7.5000 mg | SUBCUTANEOUS | 1 refills | Status: DC
Start: 2023-06-01 — End: 2023-07-10
  Filled 2023-06-01: qty 2, 28d supply, fill #0
  Filled 2023-06-24: qty 2, 28d supply, fill #1

## 2023-06-02 ENCOUNTER — Other Ambulatory Visit (HOSPITAL_BASED_OUTPATIENT_CLINIC_OR_DEPARTMENT_OTHER): Payer: Self-pay

## 2023-06-03 ENCOUNTER — Other Ambulatory Visit: Payer: Self-pay | Admitting: "Endocrinology

## 2023-06-03 ENCOUNTER — Ambulatory Visit: Admitting: Gastroenterology

## 2023-06-03 DIAGNOSIS — E0590 Thyrotoxicosis, unspecified without thyrotoxic crisis or storm: Secondary | ICD-10-CM | POA: Diagnosis not present

## 2023-06-04 ENCOUNTER — Other Ambulatory Visit (HOSPITAL_BASED_OUTPATIENT_CLINIC_OR_DEPARTMENT_OTHER): Payer: Self-pay

## 2023-06-04 LAB — TSH: TSH: <0.005 u[IU]/mL — ABNORMAL LOW (ref 0.450–4.500)

## 2023-06-04 LAB — T4, FREE: Free T4: 2.08 ng/dL — ABNORMAL HIGH (ref 0.82–1.77)

## 2023-06-05 ENCOUNTER — Encounter: Payer: Self-pay | Admitting: "Endocrinology

## 2023-06-05 ENCOUNTER — Other Ambulatory Visit

## 2023-06-05 ENCOUNTER — Other Ambulatory Visit (HOSPITAL_COMMUNITY): Payer: Self-pay | Admitting: Family Medicine

## 2023-06-05 ENCOUNTER — Telehealth (INDEPENDENT_AMBULATORY_CARE_PROVIDER_SITE_OTHER): Admitting: "Endocrinology

## 2023-06-05 DIAGNOSIS — E0500 Thyrotoxicosis with diffuse goiter without thyrotoxic crisis or storm: Secondary | ICD-10-CM

## 2023-06-05 DIAGNOSIS — E042 Nontoxic multinodular goiter: Secondary | ICD-10-CM

## 2023-06-05 NOTE — Progress Notes (Signed)
 The patient reports they are currently: [name]. I spent 11-12 minutes on the phone and video with the patient on the date of service. I spent an additional 5 minutes on pre- and post-visit activities on the date of service.   The patient was physically located in [place]  or a state in which I am permitted to provide care. The patient and/or parent/guardian understood that s/he may incur co-pays and cost sharing, and agreed to the telemedicine visit. The visit was reasonable and appropriate under the circumstances given the patient's presentation at the time.  The patient and/or parent/guardian has been advised of the potential risks and limitations of this mode of treatment (including, but not limited to, the absence of in-person examination) and has agreed to be treated using telemedicine. The patient's/patient's family's questions regarding telemedicine have been answered.   The patient and/or parent/guardian has also been advised to contact their provider's office for worsening conditions, and seek emergency medical treatment and/or call 911 if the patient deems either necessary.     Outpatient Endocrinology Note Marie Newcomer, MD  06/05/23   Marie Perez 49-Feb-1977 [id number]  Referring Provider: Francenia Ingle, NP Primary Care Provider: Francenia Ingle, NP Subjective  No chief complaint on file.   Assessment & Plan  Diagnoses and all orders for this visit:  Graves disease   Marie Perez is currently methimazole  10 mg tid since 05/22/23.  Patient was last biochemically hyperthyroid.  Educated on thyroid  axis.  06/05/23 Recommend the following: Add FT3 to labs. Continue methimazole  10 mg tid. Repeat lab before next visit or sooner if symptoms of hyperthyroidism or hypothyroidism develop.  Notify us  immediately in case of significant weight gain or loss. Counseled on compliance and follow up needs.  Follow up with ophthalmology to discuss occasional eye  concerns   03/2023 thyroid  ultrasound images and report reviewed Patient has multinodular goiter with none of the nodules meeting criteria for FNA at this point Recommend follow-up ultrasound in 1 year   I have reviewed current medications, nurse's notes, allergies, vital signs, past medical and surgical history, family medical history, and social history for this encounter. Counseled patient on symptoms, examination findings, lab findings, imaging results, treatment decisions and monitoring and prognosis. The patient understood the recommendations and agrees with the treatment plan. All questions regarding treatment plan were fully answered.   No follow-ups on file.   Marie Newcomer, MD  06/05/23   I have reviewed current medications, nurse's notes, allergies, vital signs, past medical and surgical history, family medical history, and social history for this encounter. Counseled patient on symptoms, examination findings, lab findings, imaging results, treatment decisions and monitoring and prognosis. The patient understood the recommendations and agrees with the treatment plan. All questions regarding treatment plan were fully answered.   History of Present Illness Marie Perez is a 48 y.o. year old female who presents to our clinic with hyperthyroidism diagnosed in 12/2022.    On Methimazole  10 mg tid since 05/22/23  Symptoms suggestive of HYPOTHYROIDISM:  fatigue Yes weight gain No cold intolerance  No constipation  Yes  Symptoms suggestive of HYPERTHYROIDISM:  weight loss  Yes, on mounjaro  + changed eating habits  heat intolerance Yes hyperdefecation  No palpitations  Yes, some on walking   Compressive symptoms:  dysphagia  No dysphonia  No positional dyspnea (especially with simultaneous arms elevation)  No  Smokes  No On biotin  No Personal history of head/neck surgery/irradiation  No  Grave's Ophthalmopathy Clinical  Activity Score: some issues   Adverse Drug  Effects from Methimazole  (MMI): rash No fever No throat pain No arthritis No mouth ulcers No jaundice No loss of appetite No lymphadenopathy No   03/2023 THYROID  ULTRASOUND   TECHNIQUE: Ultrasound examination of the thyroid  gland and adjacent soft tissues was performed.   COMPARISON:  None Available.   FINDINGS: Parenchymal Echotexture: Mildly heterogenous   Isthmus: 0.9 cm   Right lobe: 6.7 x 3.3 x 2.2 cm   Left lobe: 6.1 x 2.4 x 2.3 cm   _________________________________________________________   Estimated total number of nodules >/= 1 cm: 4   Number of spongiform nodules >/=  2 cm not described below (TR1): 0   Number of mixed cystic and solid nodules >/= 1.5 cm not described below (TR2): 0   _________________________________________________________   Heterogeneous and enlarged thyroid  gland containing nearly innumerable tiny cysts and nodules. The vast majority of the lesions are less than 5 mm in size and warrant no discrete description. Additional nodules as below.   Nodule # 1: Isoechoic solid nodule in the right upper gland measures 1.3 x 0.8 x 1.1 cm. Findings are consistent with TI-RADS category 3. Given size (<1.4 cm) and appearance, this nodule does NOT meet TI-RADS criteria for biopsy or dedicated follow-up.   Nodule # 2: Mixed cystic and solid nodule in the right mid gland. TI-RADS category 2. This nodule does NOT meet TI-RADS criteria for biopsy or dedicated follow-up.   Nodule # 3: Spongiform nodule in the left mid gland measures 1.1 cm. TI-RADS category 2. This nodule does NOT meet TI-RADS criteria for biopsy or dedicated follow-up.   Nodule # 4: Mixed cystic and solid nodule in the left lower gland measures up to 1.1 cm. TI-RADS category 2. This nodule does NOT meet TI-RADS criteria for biopsy or dedicated follow-up.   IMPRESSION: 1. Mildly enlarged thyroid  gland containing nearly innumerable tiny thyroid  cysts and nodules. 2. No  individual nodule meets criteria to recommend biopsy or imaging follow-up.  Physical Exam  There were no vitals taken for this visit. Constitutional: well developed, well nourished Head: normocephalic, atraumatic, no exophthalmos Eyes: sclera anicteric, no redness Neck: + thyromegaly, no thyroid  tenderness; no nodules palpated Lungs: normal respiratory effort Neurology: alert and oriented, no fine hand tremor Skin: dry, no appreciable rashes Musculoskeletal: no appreciable defects Psychiatric: normal mood and affect  Allergies No Known Allergies  Current Medications Patient's Medications  New Prescriptions   No medications on file  Previous Medications   AMLODIPINE  (NORVASC ) 5 MG TABLET    Take 1 tablet (5 mg total) by mouth daily.   ATORVASTATIN  (LIPITOR) 20 MG TABLET    Take 1 tablet (20 mg total) by mouth daily for 14 days.   CETIRIZINE  (ZYRTEC ) 10 MG TABLET    Take 1 tablet by mouth every day   ESOMEPRAZOLE  (NEXIUM ) 40 MG CAPSULE    Take 1 capsule (40 mg total) by mouth 2 (two) times daily before a meal.   FAMOTIDINE  (PEPCID ) 40 MG TABLET    Take 1 tablet (40 mg total) by mouth at bedtime.   LINACLOTIDE  (LINZESS ) 290 MCG CAPS CAPSULE    Take 1 capsule (290 mcg total) by mouth daily at least 30 minutes before the first meal of the day on an empty stomach.   METHIMAZOLE  (TAPAZOLE ) 10 MG TABLET    Take 1 tablet (10 mg total) by mouth 3 (three) times daily.   MONTELUKAST  (SINGULAIR ) 10 MG TABLET    Take  1 tablet (10 mg total) by mouth daily.   NYSTATIN  POWDER    Apply 1 Application topically 3 (three) times daily.   SENNA (SENOKOT) 8.6 MG TABS TABLET    Take 1 tablet by mouth daily as needed for mild constipation.   TIRZEPATIDE  (MOUNJARO ) 7.5 MG/0.5ML PEN    Inject 7.5 mg into the skin once a week.   VALSARTAN -HYDROCHLOROTHIAZIDE  (DIOVAN -HCT) 320-12.5 MG TABLET    Take 1 tablet by mouth daily.   VITAMIN D , ERGOCALCIFEROL , 50000 UNITS CAPS    Take 1 capsule by mouth once a week.   Modified Medications   No medications on file  Discontinued Medications   No medications on file    Past Medical History Past Medical History:  Diagnosis Date   Hyperlipidemia    Hypertension    Vitamin D  deficiency     Past Surgical History Past Surgical History:  Procedure Laterality Date   CESAREAN SECTION  01/21/2008   COLONOSCOPY     UPPER GI ENDOSCOPY      Family History family history includes Diabetes in her father; High Cholesterol in her mother; Hypertension in her mother.  Social History Social History   Socioeconomic History   Marital status: Married    Spouse name: Not on file   Number of children: 1   Years of education: Not on file   Highest education level: Associate degree: occupational, Scientist, product/process development, or vocational program  Occupational History   Occupation: Archivist  Tobacco Use   Smoking status: Never   Smokeless tobacco: Never  Vaping Use   Vaping status: Never Used  Substance and Sexual Activity   Alcohol use: No   Drug use: No   Sexual activity: Yes    Birth control/protection: None  Other Topics Concern   Not on file  Social History Narrative   Are you right handed or left handed? Right Handed   Are you currently employed ? 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 No   Who lives with you? Husband   What type of home do you live in: 1 story or 2 story? Lives in a one story home       Social Drivers of Health   Financial Resource Strain: Low Risk  (01/15/2023)   Overall Financial Resource Strain (CARDIA)    Difficulty of Paying Living Expenses: Not hard at all  Food Insecurity: No Food Insecurity (01/15/2023)   Hunger Vital Sign    Worried About Running Out of Food in the Last Year: Never true    Ran Out of Food in the Last Year: Never true  Transportation Needs: No Transportation Needs (01/15/2023)   PRAPARE - Administrator, Civil Service  (Medical): No    Lack of Transportation (Non-Medical): No  Physical Activity: Insufficiently Active (01/15/2023)   Exercise Vital Sign    Days of Exercise per Week: 2 days    Minutes of Exercise per Session: 10 min  Stress: No Stress Concern Present (01/15/2023)   [employer] of Occupational Health - Occupational Stress Questionnaire    Feeling of Stress : Not at all  Social Connections: Moderately Integrated (01/15/2023)   Social Connection and Isolation Panel [NHANES]    Frequency of Communication with Friends and Family: Twice a week    Frequency of Social Gatherings with Friends and Family: Once a week    Attends Religious Services: More than 4 times  per year    Active Member of Clubs or Organizations: No    Attends Banker Meetings: Not on file    Marital Status: Married  Intimate Partner Violence: Not At Risk (03/27/2023)   Humiliation, Afraid, Rape, and Kick questionnaire    Fear of Current or Ex-Partner: No    Emotionally Abused: No    Physically Abused: No    Sexually Abused: No    Laboratory Investigations Lab Results  Component Value Date   TSH <0.005 (L) 06/03/2023   TSH <0.01 (L) 05/21/2023   TSH <0.005 (L) 03/27/2023   FREET4 2.08 (H) 06/03/2023   FREET4 2.9 (H) 05/21/2023   FREET4 2.66 (H) 03/27/2023     Lab Results  Component Value Date   TSI 209 (H) 05/21/2023     No components found for: "TRAB"   Lab Results  Component Value Date   CHOL 131 03/27/2023   Lab Results  Component Value Date   HDL 35 (L) 03/27/2023   Lab Results  Component Value Date   LDLCALC 82 03/27/2023   Lab Results  Component Value Date   TRIG 66 03/27/2023   Lab Results  Component Value Date   CHOLHDL 3.7 03/27/2023   Lab Results  Component Value Date   CREATININE 0.92 04/09/2023   Lab Results  Component Value Date   GFR 73.43 04/09/2023      Component Value Date/Time   NA 136 04/09/2023 0832   NA 137 03/27/2023 1317   K 3.7 04/09/2023 0832    CL 103 04/09/2023 0832   CO2 26 04/09/2023 0832   GLUCOSE 97 04/09/2023 0832   BUN 12 04/09/2023 0832   BUN 9 03/27/2023 1317   CREATININE 0.92 04/09/2023 0832   CALCIUM  9.6 04/09/2023 0832   PROT 6.9 03/27/2023 1317   ALBUMIN 4.1 03/27/2023 1317   AST 25 03/27/2023 1317   ALT 17 03/27/2023 1317   ALKPHOS 67 03/27/2023 1317   BILITOT 0.5 03/27/2023 1317   GFRNONAA 54 (L) 01/23/2020 1607   GFRAA 62 01/23/2020 1607      Latest Ref Rng & Units 04/09/2023    8:32 AM 03/27/2023    1:17 PM 11/25/2022    8:10 AM  BMP  Glucose 70 - 99 mg/dL 97  91  94   BUN 6 - 23 mg/dL 12  9  12    Creatinine 0.40 - 1.20 mg/dL 1.61  0.96  0.45   BUN/Creat Ratio 9 - 23  8  11    Sodium 135 - 145 mEq/L 136  137  139   Potassium 3.5 - 5.1 mEq/L 3.7  4.0  4.0   Chloride 96 - 112 mEq/L 103  104  102   CO2 19 - 32 mEq/L 26  18  21    Calcium  8.4 - 10.5 mg/dL 9.6  9.5  9.2        Component Value Date/Time   WBC 6.5 03/27/2023 1317   WBC 9.4 04/10/2020 1817   RBC 4.78 03/27/2023 1317   RBC 4.90 04/10/2020 1817   HGB 13.6 03/27/2023 1317   HCT 42.3 03/27/2023 1317   PLT 290 03/27/2023 1317   MCV 89 03/27/2023 1317   MCH 28.5 03/27/2023 1317   MCH 29.6 04/10/2020 1817   MCHC 32.2 03/27/2023 1317   MCHC 34.1 04/10/2020 1817   RDW 13.9 03/27/2023 1317   LYMPHSABS 1.9 03/27/2023 1317   MONOABS 0.6 04/10/2020 1817   EOSABS 0.0 03/27/2023 1317   BASOSABS 0.0  03/27/2023 1317      Parts of this note may have been dictated using voice recognition software. There may be variances in spelling and vocabulary which are unintentional. Not all errors are proofread. Please notify the [facility] if any discrepancies are noted or if the meaning of any statement is not clear.

## 2023-06-06 LAB — SPECIMEN STATUS REPORT

## 2023-06-06 LAB — T3, FREE: T3, Free: 6.1 pg/mL — ABNORMAL HIGH (ref 2.0–4.4)

## 2023-06-08 ENCOUNTER — Encounter: Payer: Self-pay | Admitting: "Endocrinology

## 2023-06-08 DIAGNOSIS — E0500 Thyrotoxicosis with diffuse goiter without thyrotoxic crisis or storm: Secondary | ICD-10-CM

## 2023-06-09 ENCOUNTER — Ambulatory Visit: Admitting: "Endocrinology

## 2023-06-11 ENCOUNTER — Other Ambulatory Visit: Payer: Self-pay | Admitting: Family Medicine

## 2023-06-11 ENCOUNTER — Encounter (HOSPITAL_BASED_OUTPATIENT_CLINIC_OR_DEPARTMENT_OTHER): Payer: Self-pay

## 2023-06-11 ENCOUNTER — Other Ambulatory Visit: Payer: Self-pay

## 2023-06-11 ENCOUNTER — Other Ambulatory Visit (HOSPITAL_BASED_OUTPATIENT_CLINIC_OR_DEPARTMENT_OTHER): Payer: Self-pay

## 2023-06-11 MED ORDER — ATORVASTATIN CALCIUM 20 MG PO TABS
20.0000 mg | ORAL_TABLET | Freq: Every day | ORAL | 0 refills | Status: DC
  Filled 2023-06-11: qty 14, 14d supply, fill #0

## 2023-06-11 NOTE — Addendum Note (Signed)
 Addended by: Waneta Gut on: 06/11/2023 09:01 AM   Modules accepted: Orders

## 2023-06-13 ENCOUNTER — Telehealth: Admitting: Physician Assistant

## 2023-06-13 DIAGNOSIS — T887XXA Unspecified adverse effect of drug or medicament, initial encounter: Secondary | ICD-10-CM

## 2023-06-13 DIAGNOSIS — R112 Nausea with vomiting, unspecified: Secondary | ICD-10-CM | POA: Diagnosis not present

## 2023-06-13 MED ORDER — ONDANSETRON 4 MG PO TBDP
4.0000 mg | ORAL_TABLET | Freq: Three times a day (TID) | ORAL | 0 refills | Status: AC | PRN
  Filled 2023-06-16: qty 20, 7d supply, fill #0

## 2023-06-13 NOTE — Patient Instructions (Signed)
 Marie Perez, thank you for joining Angelia Kelp, PA-C for today's virtual visit.  While this provider is not your primary care provider (PCP), if your PCP is located in our provider database this encounter information will be shared with them immediately following your visit.   A [facility] MyChart account gives you access to today's visit and all your visits, tests, and labs performed at [facility] " click here if you don't have a [facility] MyChart account or go to [email or web address]  Consent: (Patient) Marie Perez provided verbal consent for this virtual visit at the beginning of the encounter.  Current Medications:  Current Outpatient Medications:    ondansetron  (ZOFRAN -ODT) 4 MG disintegrating tablet, Take 1 tablet (4 mg total) by mouth every 8 (eight) hours as needed., Disp: 20 tablet, Rfl: 0   amLODipine  (NORVASC ) 5 MG tablet, Take 1 tablet (5 mg total) by mouth daily., Disp: 90 tablet, Rfl: 3   atorvastatin  (LIPITOR) 20 MG tablet, Take 1 tablet (20 mg total) by mouth daily for 14 days., Disp: 14 tablet, Rfl: 0   cetirizine  (ZYRTEC ) 10 MG tablet, Take 1 tablet by mouth every day, Disp: 90 tablet, Rfl: 3   esomeprazole  (NEXIUM ) 40 MG capsule, Take 1 capsule (40 mg total) by mouth 2 (two) times daily before a meal., Disp: 60 capsule, Rfl: 11   famotidine  (PEPCID ) 40 MG tablet, Take 1 tablet (40 mg total) by mouth at bedtime. (Patient taking differently: Take 40 mg by mouth at bedtime. As needed), Disp: 30 tablet, Rfl: 2   linaclotide  (LINZESS ) 290 MCG CAPS capsule, Take 1 capsule (290 mcg total) by mouth daily at least 30 minutes before the first meal of the day on an empty stomach., Disp: 90 capsule, Rfl: 1   methimazole  (TAPAZOLE ) 10 MG tablet, Take 1 tablet (10 mg total) by mouth 3 (three) times daily., Disp: 90 tablet, Rfl: 1   montelukast  (SINGULAIR ) 10 MG tablet, Take 1 tablet (10 mg total) by mouth daily. (Patient taking differently: Take 10 mg  by mouth as needed.), Disp: 90 tablet, Rfl: 3   nystatin  powder, Apply 1 Application topically 3 (three) times daily., Disp: 60 g, Rfl: 1   senna (SENOKOT) 8.6 MG TABS tablet, Take 1 tablet by mouth daily as needed for mild constipation., Disp: , Rfl:    tirzepatide  (MOUNJARO ) 7.5 MG/0.5ML Pen, Inject 7.5 mg into the skin once a week., Disp: 2 mL, Rfl: 1   valsartan -hydrochlorothiazide  (DIOVAN -HCT) 320-12.5 MG tablet, Take 1 tablet by mouth daily., Disp: 90 tablet, Rfl: 3   Vitamin D , Ergocalciferol , 50000 units CAPS, Take 1 capsule by mouth once a week., Disp: 12 capsule, Rfl: 3   Medications ordered in this encounter:  Meds ordered this encounter  Medications   ondansetron  (ZOFRAN -ODT) 4 MG disintegrating tablet    Sig: Take 1 tablet (4 mg total) by mouth every 8 (eight) hours as needed.    Dispense:  20 tablet    Refill:  0    Supervising Provider:   LAMPTEY, PHILIP O [[id number]]     *If you need refills on other medications prior to your next appointment, please contact your pharmacy*  Follow-Up: Call back or seek an in-person evaluation if the symptoms worsen or if the condition fails to improve as anticipated.  [facility] [phone]  Other Instructions Take medication with food Zofran  if needed for nausea and/or vomiting   If you have been instructed to have an in-person evaluation today at  a local Urgent Care facility, please use the link below. It will take you to a list of all of our available [facility], including address, phone number and hours of operation. Please do not delay care.  [facility]  If you or a family member do not have a primary care provider, use the link below to schedule a visit and establish care. When you choose a [facility] primary care physician or advanced practice provider, you gain a long-term partner in health. Find a Primary Care Provider  Learn more about [facility]'s in-office and virtual care  options: [facility] - Get Care Now

## 2023-06-13 NOTE — Progress Notes (Signed)
 Virtual Visit Consent   [name], you are scheduled for a virtual visit with a [facility] provider today. Just as with appointments in the office, your consent must be obtained to participate. Your consent will be active for this visit and any virtual visit you may have with one of our providers in the next 365 days. If you have a MyChart account, a copy of this consent can be sent to you electronically.  As this is a virtual visit, video technology does not allow for your provider to perform a traditional examination. This may limit your provider's ability to fully assess your condition. If your provider identifies any concerns that need to be evaluated in person or the need to arrange testing (such as labs, EKG, etc.), we will make arrangements to do so. Although advances in technology are sophisticated, we cannot ensure that it will always work on either your end or our end. If the connection with a video visit is poor, the visit may have to be switched to a telephone visit. With either a video or telephone visit, we are not always able to ensure that we have a secure connection.  By engaging in this virtual visit, you consent to the provision of healthcare and authorize for your insurance to be billed (if applicable) for the services provided during this visit. Depending on your insurance coverage, you may receive a charge related to this service.  I need to obtain your verbal consent now. Are you willing to proceed with your visit today? Marie Perez has provided verbal consent on 06/13/2023 for a virtual visit (video or telephone). Angelia Kelp, PA-C  Date: 06/13/2023 4:03 PM   Virtual Visit via Video Note   I, Angelia Kelp, connected with  Marie Perez  ([id number], 08/03/1975) on 06/13/23 at  3:15 PM EDT by a video-enabled telemedicine application and verified that I am speaking with the correct person using two identifiers.  Location: Patient: Virtual Visit  Location Patient: Home Provider: Virtual Visit Location Provider: Home Office   I discussed the limitations of evaluation and management by telemedicine and the availability of in person appointments. The patient expressed understanding and agreed to proceed.    History of Present Illness: Marie Perez is a 48 y.o. who identifies as a female who was assigned female at birth, and is being seen today for nausea, vomiting, dry heaves.  HPI: GI Problem The primary symptoms include fatigue, abdominal pain, nausea, vomiting (stomach acid/yellow mucus) and diarrhea. Primary symptoms do not include fever, weight loss, melena, hematemesis or myalgias. The illness began today (started about 30 minutes after taking her Methimazole  on an empty stomach). The onset was sudden. The problem has been rapidly improving.  The illness does not include chills, anorexia, bloating, constipation, tenesmus, back pain or itching. Associated medical issues comments: thyroid  disease.   Does mention now noticing a lump on her throat in the area of the thyroid  gland she had not noticed previously.  Problems:  Patient Active Problem List   Diagnosis Date Noted   Hyperlipidemia 03/27/2023   Seasonal allergies 03/27/2023   Chronic constipation 03/27/2023   Vitamin D  deficiency 03/27/2023   Musculoskeletal pain 12/24/2010   Sinusitis 05/01/2010   CARPAL TUNNEL SYNDROME, RIGHT 04/05/2010   FATIGUE 02/27/2010   Fatigue 02/27/2010   FREQUENCY, URINARY 11/26/2009   OBESITY, UNSPECIFIED 10/31/2008   Essential hypertension 10/31/2008   UTI 10/31/2008   Vaginitis and vulvovaginitis 10/31/2008   Goiter 01/01/2000  Allergies: No Known Allergies Medications:  Current Outpatient Medications:    ondansetron  (ZOFRAN -ODT) 4 MG disintegrating tablet, Take 1 tablet (4 mg total) by mouth every 8 (eight) hours as needed., Disp: 20 tablet, Rfl: 0   amLODipine  (NORVASC ) 5 MG tablet, Take 1 tablet (5 mg total) by mouth daily.,  Disp: 90 tablet, Rfl: 3   atorvastatin  (LIPITOR) 20 MG tablet, Take 1 tablet (20 mg total) by mouth daily for 14 days., Disp: 14 tablet, Rfl: 0   cetirizine  (ZYRTEC ) 10 MG tablet, Take 1 tablet by mouth every day, Disp: 90 tablet, Rfl: 3   esomeprazole  (NEXIUM ) 40 MG capsule, Take 1 capsule (40 mg total) by mouth 2 (two) times daily before a meal., Disp: 60 capsule, Rfl: 11   famotidine  (PEPCID ) 40 MG tablet, Take 1 tablet (40 mg total) by mouth at bedtime. (Patient taking differently: Take 40 mg by mouth at bedtime. As needed), Disp: 30 tablet, Rfl: 2   linaclotide  (LINZESS ) 290 MCG CAPS capsule, Take 1 capsule (290 mcg total) by mouth daily at least 30 minutes before the first meal of the day on an empty stomach., Disp: 90 capsule, Rfl: 1   methimazole  (TAPAZOLE ) 10 MG tablet, Take 1 tablet (10 mg total) by mouth 3 (three) times daily., Disp: 90 tablet, Rfl: 1   montelukast  (SINGULAIR ) 10 MG tablet, Take 1 tablet (10 mg total) by mouth daily. (Patient taking differently: Take 10 mg by mouth as needed.), Disp: 90 tablet, Rfl: 3   nystatin  powder, Apply 1 Application topically 3 (three) times daily., Disp: 60 g, Rfl: 1   senna (SENOKOT) 8.6 MG TABS tablet, Take 1 tablet by mouth daily as needed for mild constipation., Disp: , Rfl:    tirzepatide  (MOUNJARO ) 7.5 MG/0.5ML Pen, Inject 7.5 mg into the skin once a week., Disp: 2 mL, Rfl: 1   valsartan -hydrochlorothiazide  (DIOVAN -HCT) 320-12.5 MG tablet, Take 1 tablet by mouth daily., Disp: 90 tablet, Rfl: 3   Vitamin D , Ergocalciferol , 50000 units CAPS, Take 1 capsule by mouth once a week., Disp: 12 capsule, Rfl: 3  Observations/Objective: Patient is well-developed, well-nourished in no acute distress.  Resting comfortably at home.  Head is normocephalic, atraumatic.  No labored breathing.  Speech is clear and coherent with logical content.  Patient is alert and oriented at baseline.    Assessment and Plan: 1. Nausea and vomiting, unspecified  vomiting type (Primary) - ondansetron  (ZOFRAN -ODT) 4 MG disintegrating tablet; Take 1 tablet (4 mg total) by mouth every 8 (eight) hours as needed.  Dispense: 20 tablet; Refill: 0  2. Medication side effect - ondansetron  (ZOFRAN -ODT) 4 MG disintegrating tablet; Take 1 tablet (4 mg total) by mouth every 8 (eight) hours as needed.  Dispense: 20 tablet; Refill: 0  - Zofran  added for nausea/vomiting  - Take Methimazole  with food - Follow up with PCP or Endocrinologist next week - Seek immediate in person evaluation if symptoms worsen  Follow Up Instructions: I discussed the assessment and treatment plan with the patient. The patient was provided an opportunity to ask questions and all were answered. The patient agreed with the plan and demonstrated an understanding of the instructions.  A copy of instructions were sent to the patient via MyChart unless otherwise noted below.    The patient was advised to call back or seek an in-person evaluation if the symptoms worsen or if the condition fails to improve as anticipated.    Angelia Kelp, PA-C

## 2023-06-14 ENCOUNTER — Encounter: Payer: Self-pay | Admitting: "Endocrinology

## 2023-06-16 ENCOUNTER — Other Ambulatory Visit (HOSPITAL_BASED_OUTPATIENT_CLINIC_OR_DEPARTMENT_OTHER): Payer: Self-pay

## 2023-06-16 NOTE — Addendum Note (Signed)
 Addended by: Teodoro Feller B on: 06/16/2023 07:59 AM   Modules accepted: Orders

## 2023-06-17 ENCOUNTER — Other Ambulatory Visit: Payer: Self-pay

## 2023-06-17 ENCOUNTER — Other Ambulatory Visit (HOSPITAL_BASED_OUTPATIENT_CLINIC_OR_DEPARTMENT_OTHER): Payer: Self-pay

## 2023-06-17 MED ORDER — PANTOPRAZOLE SODIUM 40 MG PO TBEC
40.0000 mg | DELAYED_RELEASE_TABLET | Freq: Two times a day (BID) | ORAL | 3 refills | Status: AC
  Filled 2023-06-17: qty 60, 30d supply, fill #0
  Filled 2023-12-08: qty 60, 30d supply, fill #1
  Filled 2024-01-18: qty 60, 30d supply, fill #2

## 2023-06-23 ENCOUNTER — Ambulatory Visit: Admitting: Physician Assistant

## 2023-06-24 ENCOUNTER — Other Ambulatory Visit: Payer: Self-pay | Admitting: Family Medicine

## 2023-06-24 ENCOUNTER — Other Ambulatory Visit: Payer: Self-pay

## 2023-06-24 ENCOUNTER — Other Ambulatory Visit (HOSPITAL_BASED_OUTPATIENT_CLINIC_OR_DEPARTMENT_OTHER): Payer: Self-pay

## 2023-06-24 ENCOUNTER — Telehealth: Payer: Self-pay | Admitting: Neurology

## 2023-06-24 ENCOUNTER — Encounter: Payer: Self-pay | Admitting: Neurology

## 2023-06-24 DIAGNOSIS — R7303 Prediabetes: Secondary | ICD-10-CM | POA: Diagnosis not present

## 2023-06-24 DIAGNOSIS — N289 Disorder of kidney and ureter, unspecified: Secondary | ICD-10-CM | POA: Diagnosis not present

## 2023-06-24 MED ORDER — ATORVASTATIN CALCIUM 20 MG PO TABS
20.0000 mg | ORAL_TABLET | Freq: Every day | ORAL | 0 refills | Status: DC
  Filled 2023-06-24: qty 14, 14d supply, fill #0

## 2023-06-24 MED ORDER — VALSARTAN-HYDROCHLOROTHIAZIDE 320-12.5 MG PO TABS
1.0000 | ORAL_TABLET | Freq: Every day | ORAL | 0 refills | Status: DC
  Filled 2023-06-24: qty 30, 30d supply, fill #0

## 2023-06-24 NOTE — Telephone Encounter (Signed)
 Patient left a message  on the VM stating that she was returning a call to [facility]

## 2023-06-25 ENCOUNTER — Ambulatory Visit: Payer: Self-pay | Admitting: Family Medicine

## 2023-06-25 LAB — CBC WITH DIFFERENTIAL/PLATELET
Basophils Absolute: 0 10*3/uL (ref 0.0–0.2)
Basos: 0 %
EOS (ABSOLUTE): 0.1 10*3/uL (ref 0.0–0.4)
Eos: 1 %
Hematocrit: 44 % (ref 34.0–46.6)
Hemoglobin: 14.7 g/dL (ref 11.1–15.9)
Immature Grans (Abs): 0 10*3/uL (ref 0.0–0.1)
Immature Granulocytes: 0 %
Lymphocytes Absolute: 2 10*3/uL (ref 0.7–3.1)
Lymphs: 26 %
MCH: 29.3 pg (ref 26.6–33.0)
MCHC: 33.4 g/dL (ref 31.5–35.7)
MCV: 88 fL (ref 79–97)
Monocytes Absolute: 0.5 10*3/uL (ref 0.1–0.9)
Monocytes: 7 %
Neutrophils Absolute: 5 10*3/uL (ref 1.4–7.0)
Neutrophils: 66 %
Platelets: 348 10*3/uL (ref 150–450)
RBC: 5.02 x10E6/uL (ref 3.77–5.28)
RDW: 13.7 % (ref 11.7–15.4)
WBC: 7.7 10*3/uL (ref 3.4–10.8)

## 2023-06-25 LAB — COMPREHENSIVE METABOLIC PANEL WITH GFR
ALT: 14 IU/L (ref 0–32)
AST: 14 IU/L (ref 0–40)
Albumin: 4.3 g/dL (ref 3.9–4.9)
Alkaline Phosphatase: 104 IU/L (ref 44–121)
BUN/Creatinine Ratio: 12 (ref 9–23)
BUN: 13 mg/dL (ref 6–24)
Bilirubin Total: 0.6 mg/dL (ref 0.0–1.2)
CO2: 16 mmol/L — ABNORMAL LOW (ref 20–29)
Calcium: 9.4 mg/dL (ref 8.7–10.2)
Chloride: 102 mmol/L (ref 96–106)
Creatinine, Ser: 1.12 mg/dL — ABNORMAL HIGH (ref 0.57–1.00)
Globulin, Total: 2.9 g/dL (ref 1.5–4.5)
Glucose: 91 mg/dL (ref 70–99)
Potassium: 3.9 mmol/L (ref 3.5–5.2)
Sodium: 137 mmol/L (ref 134–144)
Total Protein: 7.2 g/dL (ref 6.0–8.5)
eGFR: 60 mL/min/{1.73_m2} (ref >59)

## 2023-06-25 NOTE — Telephone Encounter (Signed)
 Patient was MY charted.

## 2023-06-26 ENCOUNTER — Other Ambulatory Visit (HOSPITAL_COMMUNITY): Payer: Self-pay | Admitting: Family Medicine

## 2023-06-29 ENCOUNTER — Ambulatory Visit: Admitting: Family Medicine

## 2023-06-29 ENCOUNTER — Encounter: Payer: Self-pay | Admitting: Family Medicine

## 2023-06-29 ENCOUNTER — Other Ambulatory Visit (HOSPITAL_COMMUNITY): Payer: Self-pay | Admitting: Family Medicine

## 2023-06-29 ENCOUNTER — Other Ambulatory Visit (HOSPITAL_BASED_OUTPATIENT_CLINIC_OR_DEPARTMENT_OTHER): Payer: Self-pay

## 2023-06-29 VITALS — BP 142/76 | HR 75 | Temp 98.0°F | Ht 65.0 in | Wt 231.0 lb

## 2023-06-29 DIAGNOSIS — Z23 Encounter for immunization: Secondary | ICD-10-CM

## 2023-06-29 DIAGNOSIS — E0590 Thyrotoxicosis, unspecified without thyrotoxic crisis or storm: Secondary | ICD-10-CM | POA: Insufficient documentation

## 2023-06-29 DIAGNOSIS — R4589 Other symptoms and signs involving emotional state: Secondary | ICD-10-CM

## 2023-06-29 DIAGNOSIS — R5383 Other fatigue: Secondary | ICD-10-CM | POA: Diagnosis not present

## 2023-06-29 DIAGNOSIS — K219 Gastro-esophageal reflux disease without esophagitis: Secondary | ICD-10-CM | POA: Diagnosis not present

## 2023-06-29 DIAGNOSIS — E785 Hyperlipidemia, unspecified: Secondary | ICD-10-CM

## 2023-06-29 DIAGNOSIS — R7989 Other specified abnormal findings of blood chemistry: Secondary | ICD-10-CM | POA: Diagnosis not present

## 2023-06-29 DIAGNOSIS — E559 Vitamin D deficiency, unspecified: Secondary | ICD-10-CM | POA: Diagnosis not present

## 2023-06-29 DIAGNOSIS — I10 Essential (primary) hypertension: Secondary | ICD-10-CM

## 2023-06-29 DIAGNOSIS — E66812 Obesity, class 2: Secondary | ICD-10-CM | POA: Diagnosis not present

## 2023-06-29 DIAGNOSIS — J302 Other seasonal allergic rhinitis: Secondary | ICD-10-CM

## 2023-06-29 DIAGNOSIS — Z6839 Body mass index (BMI) 39.0-39.9, adult: Secondary | ICD-10-CM

## 2023-06-29 DIAGNOSIS — K5909 Other constipation: Secondary | ICD-10-CM | POA: Diagnosis not present

## 2023-06-29 DIAGNOSIS — E0500 Thyrotoxicosis with diffuse goiter without thyrotoxic crisis or storm: Secondary | ICD-10-CM | POA: Diagnosis not present

## 2023-06-29 MED ORDER — ATORVASTATIN CALCIUM 20 MG PO TABS
20.0000 mg | ORAL_TABLET | Freq: Every day | ORAL | 1 refills | Status: AC
  Filled 2023-06-29 – 2023-07-09 (×2): qty 90, 90d supply, fill #0
  Filled 2023-10-08: qty 90, 90d supply, fill #1

## 2023-06-29 MED ORDER — VALSARTAN-HYDROCHLOROTHIAZIDE 320-12.5 MG PO TABS
1.0000 | ORAL_TABLET | Freq: Every day | ORAL | 1 refills | Status: AC
  Filled 2023-06-29 – 2023-07-21 (×2): qty 90, 90d supply, fill #0
  Filled 2023-10-18: qty 90, 90d supply, fill #1

## 2023-06-29 MED ORDER — ATORVASTATIN CALCIUM 20 MG PO TABS
20.0000 mg | ORAL_TABLET | Freq: Every day | ORAL | 0 refills | Status: DC
Start: 2023-06-29 — End: 2023-06-29
  Filled 2023-06-29: qty 14, 14d supply, fill #0

## 2023-06-29 MED ORDER — VALSARTAN-HYDROCHLOROTHIAZIDE 320-12.5 MG PO TABS
1.0000 | ORAL_TABLET | Freq: Every day | ORAL | 0 refills | Status: DC
  Filled 2023-06-29: qty 30, 30d supply, fill #0

## 2023-06-29 NOTE — Assessment & Plan Note (Signed)
-   Continue taking prescribed atorvastatin .  - Limit greasy/ fatty foods.

## 2023-06-29 NOTE — Assessment & Plan Note (Signed)
 TDAP administered at today's visit.

## 2023-06-29 NOTE — Assessment & Plan Note (Deleted)
-  Labs ordered- vitamin B12 and vitamin D . - If labs come back normal, recommend sleep apnea test.

## 2023-06-29 NOTE — Assessment & Plan Note (Signed)
-  Continue taking prescribed cetirizine  and montelukast  daily.

## 2023-06-29 NOTE — Assessment & Plan Note (Signed)
-   Nephrologist referral sent for further evaluation.  - Educated that if she has not heard from nephrology or schedule coordinator in 2 weeks to follow-up

## 2023-06-29 NOTE — Assessment & Plan Note (Signed)
-  Continue taking vitamin D  daily.  - Labs ordered- Vitamin D .

## 2023-06-29 NOTE — Progress Notes (Signed)
 Established Patient Office Visit  Subjective   Patient ID: Marie Perez, female    DOB: May 15, 1975  Age: 48 y.o. MRN: [id number]  Chief Complaint  Patient presents with   Medical Management of Chronic Issues    3 month follow-up    Marie Perez 48 year old female presents today for a 3- month follow-up.   Fatigue: Patient has complaints of fatigue. States that she has always had fatigue, but recently had increased fatigue in the last couple of months. She has never slept well and averages around 6 hours asleep at night. Yesterday she slept in until 12pm, which is abnormal for her. LMP 06/25/23.   Hypertension- Currently taking prescribed amlodipine  5mg , and valsartan - hydrochlorothiazide  320-12.5mg . Patient monitors blood pressure at home. Ranges include 128-138/80s. Patient is experiencing occasional headaches, which she thinks id due to Methimazole . Experiences lower extremity edema since having her daughter. Denies blurry vision, chest pain, shortness of breath, dizziness, and lightheadedness.  Hyperlipidemia- Currently taking prescribed atorvastatin  20mg  for hyperlipidemia.   Hyperthyroidism- Currently taking methimazole  10mg  for hyperthyroidism. Endocrinology managing medication.   Seasonal allergies- Currently taking prescribed cetirizine  10mg   and montelukast  10mg  for seasonal allergies.    Chronic constipation- Currently taking prescribed linaclotide  290mg  for chronic constipation. She states that medication is effective and she has no concerns. Denies Nausea, vomiting, diarrhea or abdominal pain.   Vitamin D  deficiency- Currently taking vitamin D  5000 units for vitamin D  deficiency.   Obesity- Currently taking prescribed tirzepatide  7.5mg  for obesity. Managed by telehealth Fredia Janus PA.  Anxiety about health- Patient states that she has had an increase in her anxiety lately. She is concerned about her health. She states that her anxiety is manageable and does not  need help at this time.  GERD- Patient is currently taking prescribed esomeprazole  40mg , famotidine  40mg  and pantoprazole  40mg  for GERD. GI specialist managing medications.   Blood creatinine increased- Creatinine blood levels elevated last labs 06/24/23. GFR stable. Patient is concerned about her increased creatinine blood levels.   Need for vaccination- Health maintenance reviewed. Last TDAP 2005. Patient is interested in receiving TDAP at today's visit.     Review of Systems  Constitutional:  Positive for malaise/fatigue.  Eyes:  Negative for blurred vision.  Respiratory:  Positive for shortness of breath (on exertion).   Cardiovascular:  Positive for leg swelling. Negative for chest pain and palpitations.  Gastrointestinal:  Positive for constipation. Negative for abdominal pain, diarrhea, heartburn, nausea and vomiting.  Neurological:  Positive for headaches (occ). Negative for dizziness.  Psychiatric/Behavioral:  The patient is nervous/anxious (about health).      Objective:   BP (!) 142/76   Pulse 75   Temp 98 F (36.7 C) (Oral)   Ht 5\' 5"  (1.651 m)   Wt 231 lb (104.8 kg)   LMP  (LMP Unknown)   SpO2 98%   BMI 38.44 kg/m  BP Readings from Last 3 Encounters:  06/29/23 (!) 142/76  05/21/23 124/86  04/30/23 116/82   Physical Exam Constitutional:      Appearance: Normal appearance.  HENT:     Head: Normocephalic and atraumatic.     Mouth/Throat:     Mouth: Mucous membranes are moist.  Eyes:     Extraocular Movements: Extraocular movements intact.     Conjunctiva/sclera: Conjunctivae normal.  Cardiovascular:     Rate and Rhythm: Normal rate and regular rhythm.     Pulses: Normal pulses.     Heart sounds: Normal heart sounds.  Pulmonary:  Effort: Pulmonary effort is normal.     Breath sounds: Normal breath sounds. No wheezing.  Abdominal:     General: Bowel sounds are normal.  Musculoskeletal:        General: No tenderness. Normal range of motion.      Cervical back: Normal range of motion.     Right lower leg: Edema (non-pitting) present.     Left lower leg: Edema (non-pitting) present.  Skin:    General: Skin is warm.     Capillary Refill: Capillary refill takes less than 2 seconds.  Neurological:     General: No focal deficit present.     Mental Status: She is alert and oriented to person, place, and time.  Psychiatric:        Mood and Affect: Mood normal.        Behavior: Behavior normal.    Last CBC Lab Results  Component Value Date   WBC 7.7 06/24/2023   HGB 14.7 06/24/2023   HCT 44.0 06/24/2023   MCV 88 06/24/2023   MCH 29.3 06/24/2023   RDW 13.7 06/24/2023   PLT 348 06/24/2023   Last metabolic panel Lab Results  Component Value Date   GLUCOSE 91 06/24/2023   NA 137 06/24/2023   K 3.9 06/24/2023   CL 102 06/24/2023   CO2 16 (L) 06/24/2023   BUN 13 06/24/2023   CREATININE 1.12 (H) 06/24/2023   EGFR 60 06/24/2023   CALCIUM  9.4 06/24/2023   PROT 7.2 06/24/2023   ALBUMIN 4.3 06/24/2023   LABGLOB 2.9 06/24/2023   AGRATIO 1.5 01/23/2020   BILITOT 0.6 06/24/2023   ALKPHOS 104 06/24/2023   AST 14 06/24/2023   ALT 14 06/24/2023   Last lipids Lab Results  Component Value Date   CHOL 131 03/27/2023   HDL 35 (L) 03/27/2023   LDLCALC 82 03/27/2023   TRIG 66 03/27/2023   CHOLHDL 3.7 03/27/2023   Last hemoglobin A1c Lab Results  Component Value Date   HGBA1C 5.6 06/24/2023   Last vitamin D  Lab Results  Component Value Date   VD25OH 52.6 03/27/2023      The 10-year ASCVD risk score (Arnett DK, et al., 2019) is: 6.5%    Assessment & Plan:   Problem List Items Addressed This Visit       Cardiovascular and Mediastinum   Essential hypertension   -Continue taking prescribed amlodipine  5mg  and Valsartan - hydrochlorothiazide  320-12.5mg  daily. -Blood pressure elevated at today's visit. Start monitoring blood pressure every morning and night for 2 weeks. Send me a copy of logged blood pressures. Goal is  130/80. - Limit foods with increased sodium. - Limit stress if possible.      Relevant Medications   atorvastatin  (LIPITOR) 20 MG tablet   valsartan -hydrochlorothiazide  (DIOVAN -HCT) 320-12.5 MG tablet   Other Relevant Orders   Ambulatory referral to Nephrology     Digestive   Chronic constipation   -Continue taking linaclotide  daily and senokot as needed.  -Recommend to increase fiber foods in diet.      GERD (gastroesophageal reflux disease)   -Continue taking prescribed medication regimen. -Continue to follow-up with GI specialist.        Endocrine   Hyperthyroidism   -Continue taking medication regimen.  - Continue to follow-up with endocrinology.         Other   Obesity, unspecified   -Continue to follow-up with telehealth for medication.       Relevant Orders   Lipid Panel   Ambulatory referral to Nephrology  Fatigue - Primary   -Labs ordered- vitamin B12 and vitamin D . - If labs come back normal, recommend sleep apnea test.       Relevant Orders   Vitamin D , 25-hydroxy   Vitamin B12   Hyperlipidemia   - Continue taking prescribed atorvastatin .  - Limit greasy/ fatty foods.      Relevant Medications   atorvastatin  (LIPITOR) 20 MG tablet   valsartan -hydrochlorothiazide  (DIOVAN -HCT) 320-12.5 MG tablet   Other Relevant Orders   Lipid Panel   Seasonal allergies   -Continue taking prescribed cetirizine  and montelukast  daily.       Vitamin D  deficiency   -Continue taking vitamin D  daily.  - Labs ordered- Vitamin D .      Relevant Orders   Vitamin D , 25-hydroxy   Anxiety about health   -Patient educated to follow-up if anxiety gets where its unmanageable.       Blood creatinine increased compared with prior measurement   - Nephrologist referral sent for further evaluation.  - Educated that if she has not heard from nephrology or schedule coordinator in 2 weeks to follow-up      Relevant Orders   Ambulatory referral to Nephrology   Need for  vaccination   TDAP administered at today's visit.       Relevant Orders   Tdap vaccine greater than or equal to 7yo IM (Completed)    Return in about 6 months (around 12/29/2023).    JoAnna Williamson, NP

## 2023-06-29 NOTE — Assessment & Plan Note (Signed)
-  Patient educated to follow-up if anxiety gets where its unmanageable.

## 2023-06-29 NOTE — Patient Instructions (Addendum)
 It was a pleasure seeing you today!  - Continue taking prescribed amlodipine  5mg  and valsartan - hydrochlorothiazide  320-12.5mg  daily for hypertension.  - Monitor and keep a log of blood pressure in the morning and at night for 2 weeks. Send me back the log for further evaluation. Refilled Valsartan /Hydrochlorothiazide .  - Limit stress as you are able.  - Continue taking linzess  daily and senokot as needed for constipation.  - Continue taking prescribed medication regimen for GERD and continue to follow-up with GI specialist.  - Continue seeing the Endocrinologist for hyperthyroidism.  - Labs ordered today- vitamin B12 and vitamin D  for fatigue and lipid for hyperlipidemia. If labs are normal, recommend sleep apnea test.  - Continue taking atorvastatin  for high cholesterol. Refilled medication.  - Nephrology specialty referral sent due to increased creatinine levels.  -TDAP vaccine administered today.  -Follow up in 6 months for chronic management.

## 2023-06-29 NOTE — Assessment & Plan Note (Signed)
-  Continue to follow-up with telehealth for medication.

## 2023-06-29 NOTE — Assessment & Plan Note (Signed)
-  Continue taking linaclotide  daily and senokot as needed.  -Recommend to increase fiber foods in diet.

## 2023-06-29 NOTE — Assessment & Plan Note (Signed)
-  Labs ordered- vitamin B12 and vitamin D . - If labs come back normal, recommend sleep apnea test.

## 2023-06-29 NOTE — Assessment & Plan Note (Signed)
-  Continue taking medication regimen.  - Continue to follow-up with endocrinology.

## 2023-06-29 NOTE — Assessment & Plan Note (Signed)
-  Continue taking prescribed medication regimen. -Continue to follow-up with GI specialist.

## 2023-06-29 NOTE — Assessment & Plan Note (Signed)
-  Continue taking prescribed amlodipine  5mg  and Valsartan - hydrochlorothiazide  320-12.5mg  daily. -Blood pressure elevated at today's visit. Start monitoring blood pressure every morning and night for 2 weeks. Send me a copy of logged blood pressures. Goal is 130/80. - Limit foods with increased sodium. - Limit stress if possible.

## 2023-06-30 ENCOUNTER — Encounter: Payer: Self-pay | Admitting: Family Medicine

## 2023-06-30 LAB — T4, FREE: Free T4: 1.31 ng/dL (ref 0.82–1.77)

## 2023-06-30 LAB — T3, FREE: T3, Free: 3.4 pg/mL (ref 2.0–4.4)

## 2023-06-30 LAB — TSH: TSH: <0.005 u[IU]/mL — ABNORMAL LOW (ref 0.450–4.500)

## 2023-07-02 NOTE — Progress Notes (Signed)
 NEUROLOGY FOLLOW UP VIRTUAL VISIT NOTE  Marie Perez [id number]  Virtual Visit Via Video   Consent was obtained for video visit:  Yes.   Answered questions that patient had about telehealth interaction:  Yes.   I discussed the limitations, risks, security and privacy concerns of performing an evaluation and management service by telemedicine. I also discussed with the patient that there may be a patient responsible charge related to this service. The patient expressed understanding and agreed to proceed.  Pt location: Home Physician Location: office Name of referring provider:  Francenia Ingle, NP I connected with Marie Perez at patients initiation/request on 07/10/2023 at 11:00 AM EDT by video enabled telemedicine application and verified that I am speaking with the correct person using two identifiers. Pt MRN:  [id number] Pt DOB:  Oct 16, 1975 Video Participants:  Marie Perez;  Marie Coats, MD   Subjective:  Marie Perez is a 48 y.o. year old right-handed female with a medical history of HTN, HLD, pre-DM, vit D deficiency, right carpal tunnel syndrome while pregnant who we last saw on 04/03/23 for headaches.  To briefly review: 01/09/23: Patient has had symptoms for about 1 year. She feels pressure on the right side of her head, from the front to top of her head. It is a burning sensation that feels like a tube running in her head. She has significant neck pain and tightness. She denies photophobia, phonophobia, nausea, or vomiting. She was having symptoms 2-3 times per month but has increased to twice a week. She thinks symptoms occur more often on waking and can last until mid day. It can occur during the day though. When she gets a headache, she will sit in a chair. She does not lay down, so is unsure if it changes with position. She does not notice it changing. She endorses blurry vision but not vision loss. This only started when the headaches started. Patient has  been to eye doctor this year. Per patient she had a dilated exam and did not see any issues.   One time she had right face and arm (biceps, forearm, into hand) numbness/tingling with the headache (10/2022). It woke her up from sleep and lasted about 2 hours. She went to the ED. Everything checked out, but she did not have any brain imaging per patient (done at [facility] which I cannot see records). While pregnancy, she was told she had right carpal tunnel syndrome. She mentions that sometimes she feels like she is talking slow as well.   She had a CT of head and maxillofacial on 11/27/22 that was normal.   She has tried tylenol or ibuprofen  which may knock the edge off, but does not get rid of the headache. BC headache powder will help, but tries not to take it often. She will take something 2-3 times per week. She has never had PT or had any headache medications.   Regarding sleep, she usually sleeps well, but headaches can keep her up. She is told she snores. She does not gasp for air. She sometimes feels not well rested when waking up. She feels tired throughout the day. She has never had a sleep study.   Regarding mood, she endorses some occasional anxiety, but otherwise denies depressive symptoms.   Caffeine use: 2 [employer] a week Non-smoker EtOH use: wine once a month Restrictive diet: no Family history of neurologic disease, including headaches: no   03/05/23: Labs showed TSH of 0.04 (low) and normal B12 (  435). Repeat TSH on 01/19/23 was 0.025 (T3 and T4 normal).   LP on 02/10/22 showed an opening pressure of 14 cm of H2O (normal). Routine analysis of CSF was unremarkable.   Patient is still having burning in her head and neck pain. Patient was going to PT and got dry needling. She took a break after the LP and is currently on the wait list. This helped with neck pain, but she still had the burning in the head.   She continues to sleep poorly with headaches more at night and in the  morning.  I started nortriptyline  20 mg daily on 03/05/23.  04/03/23: Patient was to follow up on 05/14/23 but was coming in early. She has been taking Nortriptyline  20 mg daily. This helped and she was not having symptoms. Then on 03/25/23, patient started having the same pain, burning of the scalp on the right. It is happening about twice per week now. It lasts 45 minutes. She is now having the symptoms in the afternoon.   She will be seeing in endocrinology in 05/2023.  Most recent Assessment and Plan (04/03/23): This is Marie Perez, a 48 y.o. female with burning pain in her head and neck. She does not call the symptoms headaches but there are headache like symptoms of burning and pressure. Given that symptoms were worse at night when laying down, I considered IIH. Her LP showed normal opening pressure and CSF analysis though. Nocturnal headaches or worse pain in the morning could be the result of undiagnosed OSA though, for which she has several signs. She has seen some improvement of neck pain with PT, but not in head pain. I started Nortriptyline  on 03/05/23 for atypical head pain and there was improvement until the last week when she has had some breakthrough episodes (4 episodes in 2 weeks).    Plan: -Increase nortriptyline  30 mg at bedtime -Continue PT -See sleep medicine as planned on 04/14/23  Since their last visit: Patient had been doing okay in terms of facial pain/pressure, but had some return ~06/24/23. It comes and goes but may happen a couple of times per week. Per patient it tends to occur when she takes her thyroid  medication. She is still taking nortriptyline  30 mg at bedtime. Overall, she thinks symptoms are similar to slightly improved. She is interested in increasing her nortriptyline  to see if this helps.  She endorses some blurry vision. She has had to wear glasses more than contacts. She saw her eye doctor since our last visit. It is better with her glasses. It can be worse  later in day. She feels like her eyes are dry.  She saw sleep medicine on 04/14/23 and just did the home sleep study this week. She does not have the results yet.    MEDICATIONS:  Outpatient Encounter Medications as of 07/10/2023  Medication Sig Note   amLODipine  (NORVASC ) 5 MG tablet Take 1 tablet (5 mg total) by mouth daily.    atorvastatin  (LIPITOR) 20 MG tablet Take 1 tablet (20 mg total) by mouth daily.    cetirizine  (ZYRTEC ) 10 MG tablet Take 1 tablet by mouth every day    esomeprazole  (NEXIUM ) 40 MG capsule Take 1 capsule (40 mg total) by mouth 2 (two) times daily before a meal.    famotidine  (PEPCID ) 40 MG tablet Take 1 tablet (40 mg total) by mouth at bedtime.    linaclotide  (LINZESS ) 290 MCG CAPS capsule Take 1 capsule (290 mcg total) by mouth daily at  least 30 minutes before the first meal of the day on an empty stomach.    methimazole  (TAPAZOLE ) 10 MG tablet Take 1 tablet (10 mg total) by mouth 2 (two) times daily.    montelukast  (SINGULAIR ) 10 MG tablet Take 1 tablet (10 mg total) by mouth daily. (Patient taking differently: Take 10 mg by mouth as needed.)    nortriptyline  (PAMELOR ) 10 MG capsule Take 4 capsules (40 mg total) by mouth at bedtime.    ondansetron  (ZOFRAN -ODT) 4 MG disintegrating tablet Take 1 tablet (4 mg total) by mouth every 8 (eight) hours as needed.    senna (SENOKOT) 8.6 MG TABS tablet Take 1 tablet by mouth daily as needed for mild constipation.    tirzepatide  (MOUNJARO ) 7.5 MG/0.5ML Pen Inject 7.5mg  dose pen injected subcutaneously once per week for 4 weeks    valsartan -hydrochlorothiazide  (DIOVAN -HCT) 320-12.5 MG tablet Take 1 tablet by mouth daily.    Vitamin D , Ergocalciferol , 50000 units CAPS Take 1 capsule by mouth once a week.    cephALEXin  (KEFLEX ) 500 MG capsule Take 1 capsule (500 mg total) by mouth 2 (two) times daily for 7 days. (Patient not taking: Reported on 07/10/2023)    nystatin  powder Apply 1 Application topically 3 (three) times daily.  (Patient not taking: Reported on 07/10/2023)    pantoprazole  (PROTONIX ) 40 MG tablet Take 1 tablet (40 mg total) by mouth 2 (two) times daily before a meal. (Patient not taking: Reported on 07/10/2023)    [DISCONTINUED] methimazole  (TAPAZOLE ) 10 MG tablet Take 1 tablet (10 mg total) by mouth 3 (three) times daily.    [DISCONTINUED] Phentermine -Topiramate  (QSYMIA ) 3.75-23 MG CP24 Take 1 tablet by mouth daily for two weeks, then take 2 tablets by mouth daily 05/03/2022: PA required/insurance won't cover and cash price is too high   [DISCONTINUED] tirzepatide  (MOUNJARO ) 7.5 MG/0.5ML Pen Inject 7.5 mg into the skin once a week.    No facility-administered encounter medications on file as of 07/10/2023.    PAST MEDICAL HISTORY: Past Medical History:  Diagnosis Date   Hyperlipidemia    Hypertension    Vitamin D  deficiency     PAST SURGICAL HISTORY: Past Surgical History:  Procedure Laterality Date   CESAREAN SECTION  01/21/2008   COLONOSCOPY     UPPER GI ENDOSCOPY      ALLERGIES: No Known Allergies  FAMILY HISTORY: Family History  Problem Relation Age of Onset   Hypertension Mother    High Cholesterol Mother    Diabetes Father    Liver disease Neg Hx    Esophageal cancer Neg Hx    Colon cancer Neg Hx    Pancreatic cancer Neg Hx    Stomach cancer Neg Hx     SOCIAL HISTORY: Social History   Tobacco Use   Smoking status: Never   Smokeless tobacco: Never  Vaping Use   Vaping status: Never Used  Substance Use Topics   Alcohol use: No   Drug use: No   Social History   Social History Narrative   Are you right handed or left handed? Right Handed   Are you currently employed ? 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 No   Who lives with you? Husband   What type of home do you live in: 1 story or 2 story? Lives in a one story home          Objective:  Vital Signs:  Ht 5' 5 (1.651 m)   Wt 200 lb  (90.7 kg)   LMP  (LMP Unknown)   BMI 33.28 kg/m   GEN:  The patient appears stated age and is in NAD.  Neurological examination: Orientation: The patient is alert and oriented x3. Cranial nerves: There is good facial symmetry. The speech is fluent and clear. Soft palate rises symmetrically and there is no tongue deviation. Hearing is intact to conversational tone. Motor: Strength is at least antigravity x 4.    Sensation: Intact to patient's own light touch. No abnormal movements.   Labs and Imaging review: New results: 06/29/23: B12: 477 Vit D wnl TSH: < 0.005, normal T3 and T4  06/24/23: HbA1c: 5.6 CMP significant for Cr 1.12 CBC w/ diff unremarkable  Previously reviewed results: 03/27/23: Vit D wnl TSH: < 0.005 Free T4: 2.66 (elevated) HbA1c: 6.0 Lipid panel: tChol 131, LDL 82, TG 66   Lumbar puncture (02/11/23): -Opening pressure 14 cm of H2O -Routine analysis: 2100 RBC, 1 WBC, 52 protein, 58 glucose   01/19/23:  TSH 0.025 Free T4, T3 wnl   B12 (01/09/23): 435   BMP (11/25/22): significant for Cr 1.07 ESR (04/30/20) wnl CBC (04/10/20) unremarkable HbA1c (01/31/20): 6.3 Lipid panel (01/23/20): tChol 194, LDL 129, TG 84   CT head wo contrast (11/27/22): Per my read: there appears to be a partially empty sella.   Radiology read: FINDINGS: Brain: No evidence of acute infarction, hemorrhage, hydrocephalus, extra-axial collection or mass lesion/mass effect.   Vascular: No hyperdense vessel or unexpected calcification.   Skull: Normal. Negative for fracture or focal lesion.   Sinuses/Orbits: No acute finding.   IMPRESSION: No acute intracranial process.   CT maxillofacial wo contrast (11/27/22): FINDINGS: Osseous: No fracture or mandibular dislocation. No destructive process.   Orbits: Negative. No traumatic or inflammatory finding.   Sinuses: Clear.   Soft tissues: Negative.   Limited intracranial: No significant or unexpected finding.    IMPRESSION: Unremarkable examination.  Assessment/Plan:  This is Marie Perez, a 48 y.o. female with pain in head and neck. She does not call the symptoms headaches but there are headache like symptoms of burning and pressure. Given that symptoms were worse at night when laying down, I considered IIH. Her LP showed normal opening pressure and CSF analysis though. Nocturnal headaches or worse pain in the morning could be the result of undiagnosed OSA though, for which she has several signs. She recently completed a home sleep study, but does not have results on this yet. She has seen some improvement of neck pain with PT. I started Nortriptyline  on 03/05/23 for atypical head pain and there has been improvement. She has had breakthrough pain though and is interested in increasing this further.   Plan: -Increase nortriptyline  to 40 mg at bedtime -Continue home PT exercises for neck -Discussed using eye drops for potential dry eyes -Follow up on sleep study  Return to clinic in 3 months   Follow up Instructions      -I discussed the assessment and treatment plan with the patient. The patient was provided an opportunity to ask questions and all were answered. The patient agreed with the plan and demonstrated an understanding of the instructions.   The patient was advised to call back or seek an in-person evaluation if the symptoms worsen or if the condition fails to improve as anticipated.    Total time spent on today's visit was 35 minutes, including both face-to-face time and nonface-to-face time.  Time  included that spent on review of records (prior notes available to me/labs/imaging if pertinent), discussing treatment and goals, answering patient's questions and coordinating care.   Ellene Gustin, MD

## 2023-07-05 ENCOUNTER — Telehealth: Admitting: Family Medicine

## 2023-07-05 DIAGNOSIS — N3000 Acute cystitis without hematuria: Secondary | ICD-10-CM

## 2023-07-05 MED ORDER — CEPHALEXIN 500 MG PO CAPS
500.0000 mg | ORAL_CAPSULE | Freq: Two times a day (BID) | ORAL | 0 refills | Status: AC
  Filled 2023-07-05: qty 14, 7d supply, fill #0

## 2023-07-05 NOTE — Patient Instructions (Signed)

## 2023-07-05 NOTE — Progress Notes (Signed)
 Virtual Visit Consent   Marie Perez, you are scheduled for a virtual visit with a [facility] provider today. Just as with appointments in the office, your consent must be obtained to participate. Your consent will be active for this visit and any virtual visit you may have with one of our providers in the next 365 days. If you have a MyChart account, a copy of this consent can be sent to you electronically.  As this is a virtual visit, video technology does not allow for your provider to perform a traditional examination. This may limit your provider's ability to fully assess your condition. If your provider identifies any concerns that need to be evaluated in person or the need to arrange testing (such as labs, EKG, etc.), we will make arrangements to do so. Although advances in technology are sophisticated, we cannot ensure that it will always work on either your end or our end. If the connection with a video visit is poor, the visit may have to be switched to a telephone visit. With either a video or telephone visit, we are not always able to ensure that we have a secure connection.  By engaging in this virtual visit, you consent to the provision of healthcare and authorize for your insurance to be billed (if applicable) for the services provided during this visit. Depending on your insurance coverage, you may receive a charge related to this service.  I need to obtain your verbal consent now. Are you willing to proceed with your visit today? Marie Perez has provided verbal consent on 07/05/2023 for a virtual visit (video or telephone). Marie Huger, FNP  Date: 07/05/2023 8:46 AM   Virtual Visit via Video Note   I, Marie Perez, connected with  Marie Perez  ([id number], 02-22-1975) on 07/05/23 at  8:45 AM EDT by a video-enabled telemedicine application and verified that I am speaking with the correct person using two identifiers.  Location: Patient: Virtual Visit Location Patient:  Home Provider: Virtual Visit Location Provider: Home Office   I discussed the limitations of evaluation and management by telemedicine and the availability of in person appointments. The patient expressed understanding and agreed to proceed.    History of Present Illness: Marie Perez is a 48 y.o. who identifies as a female who was assigned female at birth, and is being seen today for burning on urination and irritation since having diarrhea and accidentally wiping back to front. No fever or abd pain. Urinary frequency also noted. Aaron Aas  HPI: HPI  Problems:  Patient Active Problem List   Diagnosis Date Noted   Hyperthyroidism 06/29/2023   Anxiety about health 06/29/2023   GERD (gastroesophageal reflux disease) 06/29/2023   Blood creatinine increased compared with prior measurement 06/29/2023   Need for vaccination 06/29/2023   Hyperlipidemia 03/27/2023   Seasonal allergies 03/27/2023   Chronic constipation 03/27/2023   Vitamin D  deficiency 03/27/2023   Musculoskeletal pain 12/24/2010   Sinusitis 05/01/2010   CARPAL TUNNEL SYNDROME, RIGHT 04/05/2010   FATIGUE 02/27/2010   Fatigue 02/27/2010   FREQUENCY, URINARY 11/26/2009   Obesity, unspecified 10/31/2008   Essential hypertension 10/31/2008   UTI 10/31/2008   Vaginitis and vulvovaginitis 10/31/2008   Goiter 01/01/2000    Allergies: No Known Allergies Medications:  Current Outpatient Medications:    amLODipine  (NORVASC ) 5 MG tablet, Take 1 tablet (5 mg total) by mouth daily., Disp: 90 tablet, Rfl: 3   atorvastatin  (LIPITOR) 20 MG tablet, Take 1 tablet (20 mg total) by  mouth daily., Disp: 90 tablet, Rfl: 1   cetirizine  (ZYRTEC ) 10 MG tablet, Take 1 tablet by mouth every day, Disp: 90 tablet, Rfl: 3   esomeprazole  (NEXIUM ) 40 MG capsule, Take 1 capsule (40 mg total) by mouth 2 (two) times daily before a meal., Disp: 60 capsule, Rfl: 11   famotidine  (PEPCID ) 40 MG tablet, Take 1 tablet (40 mg total) by mouth at bedtime. (Patient  taking differently: Take 40 mg by mouth at bedtime. As needed), Disp: 30 tablet, Rfl: 2   linaclotide  (LINZESS ) 290 MCG CAPS capsule, Take 1 capsule (290 mcg total) by mouth daily at least 30 minutes before the first meal of the day on an empty stomach., Disp: 90 capsule, Rfl: 1   methimazole  (TAPAZOLE ) 10 MG tablet, Take 1 tablet (10 mg total) by mouth 3 (three) times daily., Disp: 90 tablet, Rfl: 1   montelukast  (SINGULAIR ) 10 MG tablet, Take 1 tablet (10 mg total) by mouth daily. (Patient taking differently: Take 10 mg by mouth as needed.), Disp: 90 tablet, Rfl: 3   nystatin  powder, Apply 1 Application topically 3 (three) times daily., Disp: 60 g, Rfl: 1   ondansetron  (ZOFRAN -ODT) 4 MG disintegrating tablet, Take 1 tablet (4 mg total) by mouth every 8 (eight) hours as needed., Disp: 20 tablet, Rfl: 0   pantoprazole  (PROTONIX ) 40 MG tablet, Take 1 tablet (40 mg total) by mouth 2 (two) times daily before a meal., Disp: 60 tablet, Rfl: 3   senna (SENOKOT) 8.6 MG TABS tablet, Take 1 tablet by mouth daily as needed for mild constipation., Disp: , Rfl:    tirzepatide  (MOUNJARO ) 7.5 MG/0.5ML Pen, Inject 7.5 mg into the skin once a week., Disp: 2 mL, Rfl: 1   valsartan -hydrochlorothiazide  (DIOVAN -HCT) 320-12.5 MG tablet, Take 1 tablet by mouth daily., Disp: 90 tablet, Rfl: 1   Vitamin D , Ergocalciferol , 50000 units CAPS, Take 1 capsule by mouth once a week., Disp: 12 capsule, Rfl: 3  Observations/Objective: Patient is well-developed, well-nourished in no acute distress.  Resting comfortably  at home.  Head is normocephalic, atraumatic.  No labored breathing.  Speech is clear and coherent with logical content.  Patient is alert and oriented at baseline.    Assessment and Plan: 1. Acute cystitis without hematuria (Primary)  Increase fluids, prevention discussed, UC if sx persist or worsen.   Follow Up Instructions: I discussed the assessment and treatment plan with the patient. The patient was  provided an opportunity to ask questions and all were answered. The patient agreed with the plan and demonstrated an understanding of the instructions.  A copy of instructions were sent to the patient via MyChart unless otherwise noted below.     The patient was advised to call back or seek an in-person evaluation if the symptoms worsen or if the condition fails to improve as anticipated.    Kyilee Gregg, FNP

## 2023-07-06 ENCOUNTER — Encounter: Payer: Self-pay | Admitting: "Endocrinology

## 2023-07-06 ENCOUNTER — Telehealth (INDEPENDENT_AMBULATORY_CARE_PROVIDER_SITE_OTHER): Admitting: "Endocrinology

## 2023-07-06 ENCOUNTER — Other Ambulatory Visit (HOSPITAL_BASED_OUTPATIENT_CLINIC_OR_DEPARTMENT_OTHER): Payer: Self-pay

## 2023-07-06 DIAGNOSIS — E042 Nontoxic multinodular goiter: Secondary | ICD-10-CM | POA: Diagnosis not present

## 2023-07-06 DIAGNOSIS — E0500 Thyrotoxicosis with diffuse goiter without thyrotoxic crisis or storm: Secondary | ICD-10-CM

## 2023-07-06 MED ORDER — METHIMAZOLE 10 MG PO TABS
10.0000 mg | ORAL_TABLET | Freq: Two times a day (BID) | ORAL | 0 refills | Status: DC
  Filled 2023-07-06: qty 180, 90d supply, fill #0
  Filled 2023-07-21: qty 107, 54d supply, fill #0
  Filled 2023-07-22: qty 73, 36d supply, fill #0

## 2023-07-06 NOTE — Progress Notes (Signed)
 The patient reports they are currently: Marie Perez. I spent 8-9 minutes on the phone and video with the patient on the date of service. I spent an additional 5 minutes on pre- and post-visit activities on the date of service.   The patient was physically located in [place]  or a state in which I am permitted to provide care. The patient and/or parent/guardian understood that s/he may incur co-pays and cost sharing, and agreed to the telemedicine visit. The visit was reasonable and appropriate under the circumstances given the patient's presentation at the time.  The patient and/or parent/guardian has been advised of the potential risks and limitations of this mode of treatment (including, but not limited to, the absence of in-person examination) and has agreed to be treated using telemedicine. The patient's/patient's family's questions regarding telemedicine have been answered.   The patient and/or parent/guardian has also been advised to contact their provider's office for worsening conditions, and seek emergency medical treatment and/or call 911 if the patient deems either necessary.     Outpatient Endocrinology Note Jorge Newcomer, MD  07/06/23   [name] 02/16/1975 [id number]  Referring Provider: Francenia Ingle, NP Primary Care Provider: Francenia Ingle, NP Subjective  No chief complaint on file.   Assessment & Plan  There are no diagnoses linked to this encounter.  Connye Delaine MONA AYARS is currently on methimazole  10 mg bid. Started methimazole  10 mg tid since 05/22/23.  Patient was last biochemically hyperthyroid.  Educated on thyroid  axis.  07/06/23 Recommend the following: Continue methimazole  10 mg bid. Repeat lab before next visit or sooner if symptoms of hyperthyroidism or hypothyroidism develop.  Notify us  immediately in case of significant weight gain or loss. Counseled on compliance and follow up needs.  Follow up with ophthalmology to discuss occasional  eye concerns   03/2023 thyroid  ultrasound images and report reviewed Patient has multinodular goiter with none of the nodules meeting criteria for FNA at this point Had GI evaluation in past. Not interested in thyroidectomy  Denies SOB on arm raise Recommend follow-up ultrasound in 1 year  I have reviewed current medications, nurse's notes, allergies, vital signs, past medical and surgical history, family medical history, and social history for this encounter. Counseled patient on symptoms, examination findings, lab findings, imaging results, treatment decisions and monitoring and prognosis. The patient understood the recommendations and agrees with the treatment plan. All questions regarding treatment plan were fully answered.   Return in about 3 months (around 10/06/2023) for visit + labs before next visit.   Jorge Newcomer, MD  07/06/23   I have reviewed current medications, nurse's notes, allergies, vital signs, past medical and surgical history, family medical history, and social history for this encounter. Counseled patient on symptoms, examination findings, lab findings, imaging results, treatment decisions and monitoring and prognosis. The patient understood the recommendations and agrees with the treatment plan. All questions regarding treatment plan were fully answered.   History of Present Illness Marie Perez is a 48 y.o. year old female who presents to our clinic with hyperthyroidism diagnosed in 12/2022.    On Methimazole  10 mg tid since 05/22/23  Symptoms suggestive of HYPOTHYROIDISM:  fatigue Yes weight gain No cold intolerance  No constipation  Yes, improved   Symptoms suggestive of HYPERTHYROIDISM:  weight loss  Yes, on mounjaro  + changed eating habits  heat intolerance Yes hyperdefecation  No palpitations  Yes, improved    Compressive symptoms:  dysphagia  Yes, sometimes. Had GI evaluation in past. Not  interested in thyroidectomy  dysphonia  Yes, some   positional dyspnea (especially with simultaneous arms elevation)  No  Smokes  No On biotin  No Personal history of head/neck surgery/irradiation  No  Grave's Ophthalmopathy Clinical Activity Score: some issues   Adverse Drug Effects from Methimazole  (MMI): rash No fever No throat pain No arthritis No mouth ulcers No jaundice No loss of appetite No lymphadenopathy No   03/2023 THYROID  ULTRASOUND   TECHNIQUE: Ultrasound examination of the thyroid  gland and adjacent soft tissues was performed.   COMPARISON:  None Available.   FINDINGS: Parenchymal Echotexture: Mildly heterogenous   Isthmus: 0.9 cm   Right lobe: 6.7 x 3.3 x 2.2 cm   Left lobe: 6.1 x 2.4 x 2.3 cm   _________________________________________________________   Estimated total number of nodules >/= 1 cm: 4   Number of spongiform nodules >/=  2 cm not described below (TR1): 0   Number of mixed cystic and solid nodules >/= 1.5 cm not described below (TR2): 0   _________________________________________________________   Heterogeneous and enlarged thyroid  gland containing nearly innumerable tiny cysts and nodules. The vast majority of the lesions are less than 5 mm in size and warrant no discrete description. Additional nodules as below.   Nodule # 1: Isoechoic solid nodule in the right upper gland measures 1.3 x 0.8 x 1.1 cm. Findings are consistent with TI-RADS category 3. Given size (<1.4 cm) and appearance, this nodule does NOT meet TI-RADS criteria for biopsy or dedicated follow-up.   Nodule # 2: Mixed cystic and solid nodule in the right mid gland. TI-RADS category 2. This nodule does NOT meet TI-RADS criteria for biopsy or dedicated follow-up.   Nodule # 3: Spongiform nodule in the left mid gland measures 1.1 cm. TI-RADS category 2. This nodule does NOT meet TI-RADS criteria for biopsy or dedicated follow-up.   Nodule # 4: Mixed cystic and solid nodule in the left lower gland measures up  to 1.1 cm. TI-RADS category 2. This nodule does NOT meet TI-RADS criteria for biopsy or dedicated follow-up.   IMPRESSION: 1. Mildly enlarged thyroid  gland containing nearly innumerable tiny thyroid  cysts and nodules. 2. No individual nodule meets criteria to recommend biopsy or imaging follow-up.  Physical Exam  LMP  (LMP Unknown)  Constitutional: well developed, well nourished Head: normocephalic, atraumatic, no exophthalmos Eyes: sclera anicteric, no redness Neck: + thyromegaly, no thyroid  tenderness; no nodules palpated Lungs: normal respiratory effort Neurology: alert and oriented, no fine hand tremor Skin: dry, no appreciable rashes Musculoskeletal: no appreciable defects Psychiatric: normal mood and affect  Allergies No Known Allergies  Current Medications Patient's Medications  New Prescriptions   No medications on file  Previous Medications   AMLODIPINE  (NORVASC ) 5 MG TABLET    Take 1 tablet (5 mg total) by mouth daily.   ATORVASTATIN  (LIPITOR) 20 MG TABLET    Take 1 tablet (20 mg total) by mouth daily.   CEPHALEXIN  (KEFLEX ) 500 MG CAPSULE    Take 1 capsule (500 mg total) by mouth 2 (two) times daily for 7 days.   CETIRIZINE  (ZYRTEC ) 10 MG TABLET    Take 1 tablet by mouth every day   ESOMEPRAZOLE  (NEXIUM ) 40 MG CAPSULE    Take 1 capsule (40 mg total) by mouth 2 (two) times daily before a meal.   FAMOTIDINE  (PEPCID ) 40 MG TABLET    Take 1 tablet (40 mg total) by mouth at bedtime.   LINACLOTIDE  (LINZESS ) 290 MCG CAPS CAPSULE    Take  1 capsule (290 mcg total) by mouth daily at least 30 minutes before the first meal of the day on an empty stomach.   METHIMAZOLE  (TAPAZOLE ) 10 MG TABLET    Take 1 tablet (10 mg total) by mouth 3 (three) times daily.   MONTELUKAST  (SINGULAIR ) 10 MG TABLET    Take 1 tablet (10 mg total) by mouth daily.   NYSTATIN  POWDER    Apply 1 Application topically 3 (three) times daily.   ONDANSETRON  (ZOFRAN -ODT) 4 MG DISINTEGRATING TABLET    Take 1  tablet (4 mg total) by mouth every 8 (eight) hours as needed.   PANTOPRAZOLE  (PROTONIX ) 40 MG TABLET    Take 1 tablet (40 mg total) by mouth 2 (two) times daily before a meal.   SENNA (SENOKOT) 8.6 MG TABS TABLET    Take 1 tablet by mouth daily as needed for mild constipation.   TIRZEPATIDE  (MOUNJARO ) 7.5 MG/0.5ML PEN    Inject 7.5 mg into the skin once a week.   VALSARTAN -HYDROCHLOROTHIAZIDE  (DIOVAN -HCT) 320-12.5 MG TABLET    Take 1 tablet by mouth daily.   VITAMIN D , ERGOCALCIFEROL , 50000 UNITS CAPS    Take 1 capsule by mouth once a week.  Modified Medications   No medications on file  Discontinued Medications   No medications on file    Past Medical History Past Medical History:  Diagnosis Date   Hyperlipidemia    Hypertension    Vitamin D  deficiency     Past Surgical History Past Surgical History:  Procedure Laterality Date   CESAREAN SECTION  01/21/2008   COLONOSCOPY     UPPER GI ENDOSCOPY      Family History family history includes Diabetes in her father; High Cholesterol in her mother; Hypertension in her mother.  Social History Social History   Socioeconomic History   Marital status: Married    Spouse name: Not on file   Number of children: 1   Years of education: Not on file   Highest education level: Associate degree: occupational, Scientist, product/process development, or vocational program  Occupational History   Occupation: Archivist  Tobacco Use   Smoking status: Never   Smokeless tobacco: Never  Vaping Use   Vaping status: Never Used  Substance and Sexual Activity   Alcohol use: No   Drug use: No   Sexual activity: Yes    Birth control/protection: None  Other Topics Concern   Not on file  Social History Narrative   Are you right handed or left handed? Right Handed   Are you currently employed ? 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 No   Who lives with you? Husband   What type of home do you  live in: 1 story or 2 story? Lives in a one story home       Social Drivers of Health   Financial Resource Strain: Low Risk  (01/15/2023)   Overall Financial Resource Strain (CARDIA)    Difficulty of Paying Living Expenses: Not hard at all  Food Insecurity: No Food Insecurity (01/15/2023)   Hunger Vital Sign    Worried About Running Out of Food in the Last Year: Never true    Ran Out of Food in the Last Year: Never true  Transportation Needs: No Transportation Needs (01/15/2023)   PRAPARE - Administrator, Civil Service (Medical): No    Lack of Transportation (Non-Medical): No  Physical Activity: Insufficiently  Active (01/15/2023)   Exercise Vital Sign    Days of Exercise per Week: 2 days    Minutes of Exercise per Session: 10 min  Stress: No Stress Concern Present (01/15/2023)   [employer] of Occupational Health - Occupational Stress Questionnaire    Feeling of Stress : Not at all  Social Connections: Moderately Integrated (01/15/2023)   Social Connection and Isolation Panel    Frequency of Communication with Friends and Family: Twice a week    Frequency of Social Gatherings with Friends and Family: Once a week    Attends Religious Services: More than 4 times per year    Active Member of Clubs or Organizations: No    Attends Engineer, structural: Not on file    Marital Status: Married  Intimate Partner Violence: Not At Risk (03/27/2023)   Humiliation, Afraid, Rape, and Kick questionnaire    Fear of Current or Ex-Partner: No    Emotionally Abused: No    Physically Abused: No    Sexually Abused: No    Laboratory Investigations Lab Results  Component Value Date   TSH <0.005 (L) 06/29/2023   TSH <0.005 (L) 06/03/2023   TSH <0.01 (L) 05/21/2023   FREET4 1.31 06/29/2023   FREET4 2.08 (H) 06/03/2023   FREET4 2.9 (H) 05/21/2023     Lab Results  Component Value Date   TSI 209 (H) 05/21/2023     No components found for: TRAB   Lab Results   Component Value Date   CHOL 131 03/27/2023   Lab Results  Component Value Date   HDL 35 (L) 03/27/2023   Lab Results  Component Value Date   LDLCALC 82 03/27/2023   Lab Results  Component Value Date   TRIG 66 03/27/2023   Lab Results  Component Value Date   CHOLHDL 3.7 03/27/2023   Lab Results  Component Value Date   CREATININE 1.12 (H) 06/24/2023   Lab Results  Component Value Date   GFR 73.43 04/09/2023      Component Value Date/Time   NA 137 06/24/2023 0815   K 3.9 06/24/2023 0815   CL 102 06/24/2023 0815   CO2 16 (L) 06/24/2023 0815   GLUCOSE 91 06/24/2023 0815   GLUCOSE 97 04/09/2023 0832   BUN 13 06/24/2023 0815   CREATININE 1.12 (H) 06/24/2023 0815   CALCIUM  9.4 06/24/2023 0815   PROT 7.2 06/24/2023 0815   ALBUMIN 4.3 06/24/2023 0815   AST 14 06/24/2023 0815   ALT 14 06/24/2023 0815   ALKPHOS 104 06/24/2023 0815   BILITOT 0.6 06/24/2023 0815   GFRNONAA 54 (L) 01/23/2020 1607   GFRAA 62 01/23/2020 1607      Latest Ref Rng & Units 06/24/2023    8:15 AM 04/09/2023    8:32 AM 03/27/2023    1:17 PM  BMP  Glucose 70 - 99 mg/dL 91  97  91   BUN 6 - 24 mg/dL 13  12  9    Creatinine 0.57 - 1.00 mg/dL 0.63  0.16  0.10   BUN/Creat Ratio 9 - 23 12   8    Sodium 134 - 144 mmol/L 137  136  137   Potassium 3.5 - 5.2 mmol/L 3.9  3.7  4.0   Chloride 96 - 106 mmol/L 102  103  104   CO2 20 - 29 mmol/L 16  26  18    Calcium  8.7 - 10.2 mg/dL 9.4  9.6  9.5        Component Value  Date/Time   WBC 7.7 06/24/2023 0815   WBC 9.4 04/10/2020 1817   RBC 5.02 06/24/2023 0815   RBC 4.90 04/10/2020 1817   HGB 14.7 06/24/2023 0815   HCT 44.0 06/24/2023 0815   PLT 348 06/24/2023 0815   MCV 88 06/24/2023 0815   MCH 29.3 06/24/2023 0815   MCH 29.6 04/10/2020 1817   MCHC 33.4 06/24/2023 0815   MCHC 34.1 04/10/2020 1817   RDW 13.7 06/24/2023 0815   LYMPHSABS 2.0 06/24/2023 0815   MONOABS 0.6 04/10/2020 1817   EOSABS 0.1 06/24/2023 0815   BASOSABS 0.0 06/24/2023 0815       Parts of this note may have been dictated using voice recognition software. There may be variances in spelling and vocabulary which are unintentional. Not all errors are proofread. Please notify the [facility] if any discrepancies are noted or if the meaning of any statement is not clear.

## 2023-07-07 ENCOUNTER — Telehealth: Admitting: "Endocrinology

## 2023-07-08 ENCOUNTER — Encounter

## 2023-07-08 DIAGNOSIS — E049 Nontoxic goiter, unspecified: Secondary | ICD-10-CM

## 2023-07-08 DIAGNOSIS — R0683 Snoring: Secondary | ICD-10-CM

## 2023-07-08 DIAGNOSIS — R7989 Other specified abnormal findings of blood chemistry: Secondary | ICD-10-CM

## 2023-07-08 DIAGNOSIS — R5382 Chronic fatigue, unspecified: Secondary | ICD-10-CM

## 2023-07-09 ENCOUNTER — Other Ambulatory Visit (HOSPITAL_BASED_OUTPATIENT_CLINIC_OR_DEPARTMENT_OTHER): Payer: Self-pay

## 2023-07-10 ENCOUNTER — Other Ambulatory Visit: Payer: Self-pay

## 2023-07-10 ENCOUNTER — Encounter: Payer: Self-pay | Admitting: Neurology

## 2023-07-10 ENCOUNTER — Telehealth: Admitting: Neurology

## 2023-07-10 ENCOUNTER — Other Ambulatory Visit (HOSPITAL_BASED_OUTPATIENT_CLINIC_OR_DEPARTMENT_OTHER): Payer: Self-pay

## 2023-07-10 VITALS — Ht 65.0 in | Wt 200.0 lb

## 2023-07-10 DIAGNOSIS — G5601 Carpal tunnel syndrome, right upper limb: Secondary | ICD-10-CM

## 2023-07-10 DIAGNOSIS — R202 Paresthesia of skin: Secondary | ICD-10-CM

## 2023-07-10 DIAGNOSIS — R0683 Snoring: Secondary | ICD-10-CM

## 2023-07-10 DIAGNOSIS — H539 Unspecified visual disturbance: Secondary | ICD-10-CM

## 2023-07-10 DIAGNOSIS — H538 Other visual disturbances: Secondary | ICD-10-CM

## 2023-07-10 DIAGNOSIS — H04123 Dry eye syndrome of bilateral lacrimal glands: Secondary | ICD-10-CM

## 2023-07-10 DIAGNOSIS — R519 Headache, unspecified: Secondary | ICD-10-CM | POA: Diagnosis not present

## 2023-07-10 DIAGNOSIS — M542 Cervicalgia: Secondary | ICD-10-CM

## 2023-07-10 DIAGNOSIS — Z72820 Sleep deprivation: Secondary | ICD-10-CM

## 2023-07-10 DIAGNOSIS — R400 Somnolence: Secondary | ICD-10-CM

## 2023-07-10 DIAGNOSIS — G4486 Cervicogenic headache: Secondary | ICD-10-CM

## 2023-07-10 MED ORDER — NORTRIPTYLINE HCL 10 MG PO CAPS
40.0000 mg | ORAL_CAPSULE | Freq: Every day | ORAL | 11 refills | Status: DC
  Filled 2023-07-10: qty 120, 30d supply, fill #0

## 2023-07-10 MED ORDER — MOUNJARO 7.5 MG/0.5ML ~~LOC~~ SOAJ
Freq: Once | SUBCUTANEOUS | 2 refills | Status: AC
  Filled 2023-07-10 – 2023-07-21 (×3): qty 2, 28d supply, fill #0
  Filled 2023-08-25: qty 2, 28d supply, fill #1

## 2023-07-10 NOTE — Addendum Note (Signed)
 Addended by: Ellene Gustin on: 07/10/2023 04:04 PM   Modules accepted: Level of Service

## 2023-07-10 NOTE — Patient Instructions (Addendum)
-  Increase nortriptyline  to 40 mg at bedtime (4 tablets). I sent a new prescription to your pharmacy.  -Discussed using eye drops for potential dry eyes  I will see you back in clinic in about 3 months. Someone will reach out to schedule this appointment. Let us  know if they do not [phone]).  The physicians and staff at [facility] are committed to providing excellent care. You may receive a survey requesting feedback about your experience at our office. We strive to receive very good responses to the survey questions. If you feel that your experience would prevent you from giving the office a very good  response, please contact our office to try to remedy the situation. We may be reached at [phone]. Thank you for taking the time out of your busy day to complete the survey.  Rommie Coats, MD [facility]

## 2023-07-15 ENCOUNTER — Encounter (HOSPITAL_BASED_OUTPATIENT_CLINIC_OR_DEPARTMENT_OTHER): Payer: Self-pay

## 2023-07-15 DIAGNOSIS — G4730 Sleep apnea, unspecified: Secondary | ICD-10-CM

## 2023-07-18 LAB — SPECIMEN STATUS REPORT

## 2023-07-18 LAB — HEMOGLOBIN A1C
Est. average glucose Bld gHb Est-mCnc: 114 mg/dL
Hgb A1c MFr Bld: 5.6 % (ref 4.8–5.6)

## 2023-07-21 ENCOUNTER — Other Ambulatory Visit: Payer: Self-pay

## 2023-07-21 ENCOUNTER — Other Ambulatory Visit (HOSPITAL_BASED_OUTPATIENT_CLINIC_OR_DEPARTMENT_OTHER): Payer: Self-pay

## 2023-07-21 LAB — SPECIMEN STATUS REPORT

## 2023-07-21 LAB — VITAMIN D 25 HYDROXY (VIT D DEFICIENCY, FRACTURES): Vit D, 25-Hydroxy: 55.4 ng/mL (ref 30.0–100.0)

## 2023-07-21 LAB — VITAMIN B12: Vitamin B-12: 477 pg/mL (ref 232–1245)

## 2023-07-22 ENCOUNTER — Other Ambulatory Visit: Payer: Self-pay

## 2023-07-22 ENCOUNTER — Other Ambulatory Visit (HOSPITAL_BASED_OUTPATIENT_CLINIC_OR_DEPARTMENT_OTHER): Payer: Self-pay

## 2023-07-22 DIAGNOSIS — G4733 Obstructive sleep apnea (adult) (pediatric): Secondary | ICD-10-CM | POA: Diagnosis not present

## 2023-07-27 ENCOUNTER — Other Ambulatory Visit (HOSPITAL_BASED_OUTPATIENT_CLINIC_OR_DEPARTMENT_OTHER): Payer: Self-pay

## 2023-07-28 DIAGNOSIS — F1090 Alcohol use, unspecified, uncomplicated: Secondary | ICD-10-CM | POA: Diagnosis not present

## 2023-07-28 DIAGNOSIS — F59 Unspecified behavioral syndromes associated with physiological disturbances and physical factors: Secondary | ICD-10-CM | POA: Diagnosis not present

## 2023-07-28 DIAGNOSIS — Z713 Dietary counseling and surveillance: Secondary | ICD-10-CM | POA: Diagnosis not present

## 2023-07-28 DIAGNOSIS — E669 Obesity, unspecified: Secondary | ICD-10-CM | POA: Diagnosis not present

## 2023-07-28 DIAGNOSIS — F509 Eating disorder, unspecified: Secondary | ICD-10-CM | POA: Diagnosis not present

## 2023-07-29 ENCOUNTER — Encounter: Payer: Self-pay | Admitting: Family Medicine

## 2023-08-03 ENCOUNTER — Other Ambulatory Visit: Payer: Self-pay | Admitting: Family Medicine

## 2023-08-03 NOTE — Telephone Encounter (Signed)
 Please let patient know she has MILD OSA, she have average 13 apneas per hour with normal level of oxygen desaturations. She spent 0 min with O2 <88%. If symptomatic recommend either referral to orthodontics for oral appliance or we can order CPAP 5-15cm h20.

## 2023-08-03 NOTE — Telephone Encounter (Signed)
Can you please advise on results?  

## 2023-08-06 ENCOUNTER — Telehealth: Payer: Self-pay | Admitting: Family Medicine

## 2023-08-06 NOTE — Telephone Encounter (Signed)
 I need to decline this TOC please.

## 2023-08-06 NOTE — Telephone Encounter (Signed)
 Copied from CRM [id number]. Topic: Appointments - Transfer of Care >> Aug 06, 2023 11:43 AM [name] wrote: Pt is requesting to transfer FROM: Marie Perez Pt is requesting to transfer TO: Marie Perez Reason for requested transfer: Not satisfied with level of care that was provided for her. It is the responsibility of the team the patient would like to transfer to (Dr. Lucius) to reach out to the patient if for any reason this transfer is not acceptable.

## 2023-08-07 ENCOUNTER — Telehealth: Payer: Self-pay | Admitting: Family Medicine

## 2023-08-07 NOTE — Telephone Encounter (Unsigned)
 Copied from CRM [id number]. Topic: Complaint (DO NOT CONVERT) - Care >> Aug 06, 2023 11:44 AM [name] wrote: Date of Incident: 01/21/2023 -- 07/29/2023  Details of complaint: Patient feels like her providers she saw in [place] (Dr. Mercer & Philippe Slade) were very dismissive and not proactive with what she was wanting for care. She feels like there was also a lack of care for the issue she was struggling with and it was as if she was being thrown around in different directions. Patient stated that it feels as though she's back at square one and explained how her Neurologist showed more care than her primary providers.  How would the patient like to see it resolved? Transfer of care. (Transfer of care has been scheduled).  On a scale of 1-10, how was your experience? 4 What would it take to bring it to a 10? Patient stated that nothing can be done to bring the score up to a 10.  Route to Research officer, political party.

## 2023-08-14 ENCOUNTER — Other Ambulatory Visit (HOSPITAL_BASED_OUTPATIENT_CLINIC_OR_DEPARTMENT_OTHER): Payer: Self-pay

## 2023-08-18 ENCOUNTER — Telehealth: Payer: Self-pay | Admitting: "Endocrinology

## 2023-08-18 ENCOUNTER — Encounter: Payer: Self-pay | Admitting: "Endocrinology

## 2023-08-18 NOTE — Telephone Encounter (Signed)
 Patient is wanting her current lab orders sent to [facility] in [place] like she has done previously.

## 2023-08-19 ENCOUNTER — Telehealth: Payer: Self-pay

## 2023-08-19 DIAGNOSIS — E0590 Thyrotoxicosis, unspecified without thyrotoxic crisis or storm: Secondary | ICD-10-CM

## 2023-08-19 DIAGNOSIS — E0500 Thyrotoxicosis with diffuse goiter without thyrotoxic crisis or storm: Secondary | ICD-10-CM

## 2023-08-19 NOTE — Telephone Encounter (Signed)
 Pt requesting labs for [facility] reordered labs under [facility] so pt could get labs done.

## 2023-08-24 DIAGNOSIS — I10 Essential (primary) hypertension: Secondary | ICD-10-CM | POA: Diagnosis not present

## 2023-08-24 DIAGNOSIS — K5909 Other constipation: Secondary | ICD-10-CM | POA: Diagnosis not present

## 2023-08-24 DIAGNOSIS — R5383 Other fatigue: Secondary | ICD-10-CM | POA: Diagnosis not present

## 2023-08-24 DIAGNOSIS — K219 Gastro-esophageal reflux disease without esophagitis: Secondary | ICD-10-CM | POA: Diagnosis not present

## 2023-08-24 DIAGNOSIS — Z7689 Persons encountering health services in other specified circumstances: Secondary | ICD-10-CM | POA: Diagnosis not present

## 2023-08-24 DIAGNOSIS — E785 Hyperlipidemia, unspecified: Secondary | ICD-10-CM | POA: Diagnosis not present

## 2023-08-25 ENCOUNTER — Other Ambulatory Visit: Payer: Self-pay | Admitting: Gastroenterology

## 2023-08-25 ENCOUNTER — Other Ambulatory Visit (HOSPITAL_BASED_OUTPATIENT_CLINIC_OR_DEPARTMENT_OTHER): Payer: Self-pay

## 2023-08-25 MED ORDER — ESOMEPRAZOLE MAGNESIUM 40 MG PO CPDR
40.0000 mg | DELAYED_RELEASE_CAPSULE | Freq: Every morning | ORAL | 0 refills | Status: DC
  Filled 2023-09-11 – 2023-09-18 (×2): qty 90, 90d supply, fill #0

## 2023-08-25 MED ORDER — FAMOTIDINE 40 MG PO TABS
40.0000 mg | ORAL_TABLET | Freq: Every day | ORAL | 2 refills | Status: AC
  Filled 2023-08-25: qty 90, 90d supply, fill #0
  Filled 2023-12-08: qty 90, 90d supply, fill #1
  Filled 2024-01-18 – 2024-01-19 (×2): qty 90, 90d supply, fill #2

## 2023-08-25 MED ORDER — AMLODIPINE BESYLATE 5 MG PO TABS
5.0000 mg | ORAL_TABLET | Freq: Every day | ORAL | 0 refills | Status: AC
  Filled 2023-08-25 – 2023-11-13 (×2): qty 90, 90d supply, fill #0

## 2023-08-25 MED ORDER — LINZESS 290 MCG PO CAPS
290.0000 ug | ORAL_CAPSULE | Freq: Every day | ORAL | 0 refills | Status: DC
  Filled 2023-09-11: qty 90, fill #0
  Filled 2023-10-15: qty 90, 90d supply, fill #0
  Filled 2023-10-18: qty 90, fill #0
  Filled 2023-11-12: qty 90, 90d supply, fill #0

## 2023-08-25 MED ORDER — VALSARTAN-HYDROCHLOROTHIAZIDE 320-12.5 MG PO TABS
1.0000 | ORAL_TABLET | Freq: Every day | ORAL | 0 refills | Status: AC
  Filled 2024-01-16: qty 90, 90d supply, fill #0

## 2023-08-25 MED ORDER — ATORVASTATIN CALCIUM 20 MG PO TABS
20.0000 mg | ORAL_TABLET | Freq: Every day | ORAL | 0 refills | Status: DC
  Filled 2023-08-25 – 2024-01-03 (×2): qty 90, 90d supply, fill #0

## 2023-08-27 ENCOUNTER — Other Ambulatory Visit (HOSPITAL_BASED_OUTPATIENT_CLINIC_OR_DEPARTMENT_OTHER): Payer: Self-pay

## 2023-08-27 DIAGNOSIS — F59 Unspecified behavioral syndromes associated with physiological disturbances and physical factors: Secondary | ICD-10-CM | POA: Diagnosis not present

## 2023-08-27 DIAGNOSIS — F509 Eating disorder, unspecified: Secondary | ICD-10-CM | POA: Diagnosis not present

## 2023-08-27 DIAGNOSIS — Z713 Dietary counseling and surveillance: Secondary | ICD-10-CM | POA: Diagnosis not present

## 2023-08-27 DIAGNOSIS — F1090 Alcohol use, unspecified, uncomplicated: Secondary | ICD-10-CM | POA: Diagnosis not present

## 2023-08-27 DIAGNOSIS — E669 Obesity, unspecified: Secondary | ICD-10-CM | POA: Diagnosis not present

## 2023-08-27 MED ORDER — MOUNJARO 7.5 MG/0.5ML ~~LOC~~ SOAJ
7.5000 mg | SUBCUTANEOUS | 2 refills | Status: DC
  Filled 2023-08-27 – 2023-09-19 (×6): qty 2, 28d supply, fill #0
  Filled 2023-10-06 – 2023-10-15 (×2): qty 2, 28d supply, fill #1

## 2023-09-03 ENCOUNTER — Other Ambulatory Visit (HOSPITAL_BASED_OUTPATIENT_CLINIC_OR_DEPARTMENT_OTHER): Payer: Self-pay

## 2023-09-03 DIAGNOSIS — Z01419 Encounter for gynecological examination (general) (routine) without abnormal findings: Secondary | ICD-10-CM | POA: Diagnosis not present

## 2023-09-03 DIAGNOSIS — I10 Essential (primary) hypertension: Secondary | ICD-10-CM | POA: Diagnosis not present

## 2023-09-03 DIAGNOSIS — B3731 Acute candidiasis of vulva and vagina: Secondary | ICD-10-CM | POA: Diagnosis not present

## 2023-09-03 DIAGNOSIS — E0590 Thyrotoxicosis, unspecified without thyrotoxic crisis or storm: Secondary | ICD-10-CM | POA: Diagnosis not present

## 2023-09-03 MED ORDER — FLUCONAZOLE 150 MG PO TABS
150.0000 mg | ORAL_TABLET | ORAL | 1 refills | Status: AC
  Filled 2023-09-03: qty 1, 1d supply, fill #0
  Filled 2023-09-11: qty 1, 1d supply, fill #1

## 2023-09-03 MED ORDER — NYSTATIN-TRIAMCINOLONE 100000-0.1 UNIT/GM-% EX OINT
1.0000 | TOPICAL_OINTMENT | Freq: Two times a day (BID) | CUTANEOUS | 2 refills | Status: AC
  Filled 2023-09-03: qty 30, 15d supply, fill #0
  Filled 2023-12-25: qty 30, 15d supply, fill #1
  Filled 2024-01-16: qty 30, 15d supply, fill #2

## 2023-09-04 DIAGNOSIS — I129 Hypertensive chronic kidney disease with stage 1 through stage 4 chronic kidney disease, or unspecified chronic kidney disease: Secondary | ICD-10-CM | POA: Diagnosis not present

## 2023-09-04 DIAGNOSIS — R7989 Other specified abnormal findings of blood chemistry: Secondary | ICD-10-CM | POA: Diagnosis not present

## 2023-09-04 DIAGNOSIS — E785 Hyperlipidemia, unspecified: Secondary | ICD-10-CM | POA: Diagnosis not present

## 2023-09-04 DIAGNOSIS — N182 Chronic kidney disease, stage 2 (mild): Secondary | ICD-10-CM | POA: Diagnosis not present

## 2023-09-04 LAB — LAB REPORT - SCANNED
Albumin, Urine POC: 12
Albumin/Creatinine Ratio, Urine, POC: 4
Creatinine, POC: 296.7 mg/dL

## 2023-09-08 ENCOUNTER — Encounter (HOSPITAL_BASED_OUTPATIENT_CLINIC_OR_DEPARTMENT_OTHER): Payer: Self-pay

## 2023-09-09 ENCOUNTER — Other Ambulatory Visit (HOSPITAL_BASED_OUTPATIENT_CLINIC_OR_DEPARTMENT_OTHER): Payer: Self-pay

## 2023-09-09 ENCOUNTER — Encounter: Admitting: Internal Medicine

## 2023-09-11 ENCOUNTER — Other Ambulatory Visit (HOSPITAL_BASED_OUTPATIENT_CLINIC_OR_DEPARTMENT_OTHER): Payer: Self-pay

## 2023-09-11 ENCOUNTER — Other Ambulatory Visit: Payer: Self-pay

## 2023-09-11 DIAGNOSIS — Z13220 Encounter for screening for lipoid disorders: Secondary | ICD-10-CM | POA: Diagnosis not present

## 2023-09-11 DIAGNOSIS — Z1389 Encounter for screening for other disorder: Secondary | ICD-10-CM | POA: Diagnosis not present

## 2023-09-11 DIAGNOSIS — Z131 Encounter for screening for diabetes mellitus: Secondary | ICD-10-CM | POA: Diagnosis not present

## 2023-09-11 DIAGNOSIS — Z6837 Body mass index (BMI) 37.0-37.9, adult: Secondary | ICD-10-CM | POA: Diagnosis not present

## 2023-09-11 DIAGNOSIS — E66812 Obesity, class 2: Secondary | ICD-10-CM | POA: Diagnosis not present

## 2023-09-11 DIAGNOSIS — Z0000 Encounter for general adult medical examination without abnormal findings: Secondary | ICD-10-CM | POA: Diagnosis not present

## 2023-09-11 DIAGNOSIS — Z23 Encounter for immunization: Secondary | ICD-10-CM | POA: Diagnosis not present

## 2023-09-17 ENCOUNTER — Other Ambulatory Visit (HOSPITAL_BASED_OUTPATIENT_CLINIC_OR_DEPARTMENT_OTHER): Payer: Self-pay

## 2023-09-18 ENCOUNTER — Other Ambulatory Visit (HOSPITAL_BASED_OUTPATIENT_CLINIC_OR_DEPARTMENT_OTHER): Payer: Self-pay

## 2023-09-18 ENCOUNTER — Other Ambulatory Visit: Payer: Self-pay

## 2023-09-18 DIAGNOSIS — F509 Eating disorder, unspecified: Secondary | ICD-10-CM | POA: Diagnosis not present

## 2023-09-18 DIAGNOSIS — F1090 Alcohol use, unspecified, uncomplicated: Secondary | ICD-10-CM | POA: Diagnosis not present

## 2023-09-18 DIAGNOSIS — E669 Obesity, unspecified: Secondary | ICD-10-CM | POA: Diagnosis not present

## 2023-09-18 DIAGNOSIS — F59 Unspecified behavioral syndromes associated with physiological disturbances and physical factors: Secondary | ICD-10-CM | POA: Diagnosis not present

## 2023-09-18 DIAGNOSIS — Z713 Dietary counseling and surveillance: Secondary | ICD-10-CM | POA: Diagnosis not present

## 2023-09-19 ENCOUNTER — Other Ambulatory Visit (HOSPITAL_BASED_OUTPATIENT_CLINIC_OR_DEPARTMENT_OTHER): Payer: Self-pay

## 2023-09-22 ENCOUNTER — Other Ambulatory Visit (HOSPITAL_BASED_OUTPATIENT_CLINIC_OR_DEPARTMENT_OTHER): Payer: Self-pay

## 2023-09-22 NOTE — Progress Notes (Unsigned)
 NEUROLOGY FOLLOW UP OFFICE NOTE  Marie Perez [id number]  Subjective:  Marie Perez is a 48 y.o. year old right-handed female with a medical history of HTN, HLD, pre-DM, vit D deficiency, right carpal tunnel syndrome while pregnant who we last saw on 07/10/23 for pain in head and neck.  To briefly review: 01/09/23: Patient has had symptoms for about 1 year. She feels pressure on the right side of her head, from the front to top of her head. It is a burning sensation that feels like a tube running in her head. She has significant neck pain and tightness. She denies photophobia, phonophobia, nausea, or vomiting. She was having symptoms 2-3 times per month but has increased to twice a week. She thinks symptoms occur more often on waking and can last until mid day. It can occur during the day though. When she gets a headache, she will sit in a chair. She does not lay down, so is unsure if it changes with position. She does not notice it changing. She endorses blurry vision but not vision loss. This only started when the headaches started. Patient has been to eye doctor this year. Per patient she had a dilated exam and did not see any issues.   One time she had right face and arm (biceps, forearm, into hand) numbness/tingling with the headache (10/2022). It woke her up from sleep and lasted about 2 hours. She went to the ED. Everything checked out, but she did not have any brain imaging per patient (done at [facility] which I cannot see records). While pregnancy, she was told she had right carpal tunnel syndrome. She mentions that sometimes she feels like she is talking slow as well.   She had a CT of head and maxillofacial on 11/27/22 that was normal.   She has tried tylenol or ibuprofen  which may knock the edge off, but does not get rid of the headache. BC headache powder will help, but tries not to take it often. She will take something 2-3 times per week. She has never had PT or had any headache  medications.   Regarding sleep, she usually sleeps well, but headaches can keep her up. She is told she snores. She does not gasp for air. She sometimes feels not well rested when waking up. She feels tired throughout the day. She has never had a sleep study.   Regarding mood, she endorses some occasional anxiety, but otherwise denies depressive symptoms.   Caffeine use: 2 [employer] a week Non-smoker EtOH use: wine once a month Restrictive diet: no Family history of neurologic disease, including headaches: no   03/05/23: Labs showed TSH of 0.04 (low) and normal B12 (435). Repeat TSH on 01/19/23 was 0.025 (T3 and T4 normal).   LP on 02/10/22 showed an opening pressure of 14 cm of H2O (normal). Routine analysis of CSF was unremarkable.   Patient is still having burning in her head and neck pain. Patient was going to PT and got dry needling. She took a break after the LP and is currently on the wait list. This helped with neck pain, but she still had the burning in the head.   She continues to sleep poorly with headaches more at night and in the morning.   I started nortriptyline  20 mg daily on 03/05/23.   04/03/23: Patient was to follow up on 05/14/23 but was coming in early. She has been taking Nortriptyline  20 mg daily. This helped and she was not having  symptoms. Then on 03/25/23, patient started having the same pain, burning of the scalp on the right. It is happening about twice per week now. It lasts 45 minutes. She is now having the symptoms in the afternoon.   She will be seeing in endocrinology in 05/2023.  07/10/23: Patient had been doing okay in terms of facial pain/pressure, but had some return ~06/24/23. It comes and goes but may happen a couple of times per week. Per patient it tends to occur when she takes her thyroid  medication. She is still taking nortriptyline  30 mg at bedtime. Overall, she thinks symptoms are similar to slightly improved. She is interested in increasing her  nortriptyline  to see if this helps.   She endorses some blurry vision. She has had to wear glasses more than contacts. She saw her eye doctor since our last visit. It is better with her glasses. It can be worse later in day. She feels like her eyes are dry.   She saw sleep medicine on 04/14/23 and just did the home sleep study this week. She does not have the results yet.  Most recent Assessment and Plan (07/10/23): This is Marie Perez, a 48 y.o. female with pain in head and neck. She does not call the symptoms headaches but there are headache like symptoms of burning and pressure. Given that symptoms were worse at night when laying down, I considered IIH. Her LP showed normal opening pressure and CSF analysis though. Nocturnal headaches or worse pain in the morning could be the result of undiagnosed OSA though, for which she has several signs. She recently completed a home sleep study, but does not have results on this yet. She has seen some improvement of neck pain with PT. I started Nortriptyline  on 03/05/23 for atypical head pain and there has been improvement. She has had breakthrough pain though and is interested in increasing this further.    Plan: -Increase nortriptyline  to 40 mg at bedtime -Continue home PT exercises for neck -Discussed using eye drops for potential dry eyes -Follow up on sleep study  Since their last visit: Sleep study was consistent with mild OSA. She was referred to orthodontics for oral appliance.***  MEDICATIONS:  Outpatient Encounter Medications as of 09/23/2023  Medication Sig Note   amLODipine  (NORVASC ) 5 MG tablet Take 1 tablet (5 mg total) by mouth daily.    amLODipine  (NORVASC ) 5 MG tablet Take 1 tablet (5 mg total) by mouth daily.    atorvastatin  (LIPITOR) 20 MG tablet Take 1 tablet (20 mg total) by mouth daily.    atorvastatin  (LIPITOR) 20 MG tablet Take 1 tablet (20 mg total) by mouth daily.    cetirizine  (ZYRTEC ) 10 MG tablet Take 1 tablet by mouth  every day    esomeprazole  (NEXIUM ) 40 MG capsule Take 1 capsule (40 mg total) by mouth 2 (two) times daily before a meal.    esomeprazole  (NEXIUM ) 40 MG capsule Take 1 capsule (40 mg total) by mouth in the morning before breakfast.    famotidine  (PEPCID ) 40 MG tablet Take 1 tablet (40 mg total) by mouth at bedtime.    fluconazole  (DIFLUCAN ) 150 MG tablet Take 1 tablet (150 mg total) by mouth as directed.    linaclotide  (LINZESS ) 290 MCG CAPS capsule Take 1 capsule (290 mcg total) by mouth daily at least 30 minutes before the first meal of the day on an empty stomach.    LINZESS  290 MCG CAPS capsule Take 1 capsule (290 mcg total) by  mouth daily. Take on an empty stomach, at least 30 minutes prior to a meal, and at approximately the same time each day.    methimazole  (TAPAZOLE ) 10 MG tablet Take 1 tablet (10 mg total) by mouth 2 (two) times daily.    montelukast  (SINGULAIR ) 10 MG tablet Take 1 tablet (10 mg total) by mouth daily. (Patient taking differently: Take 10 mg by mouth as needed.)    nortriptyline  (PAMELOR ) 10 MG capsule Take 4 capsules (40 mg total) by mouth at bedtime.    nystatin  powder Apply 1 Application topically 3 (three) times daily. (Patient not taking: Reported on 07/10/2023)    nystatin -triamcinolone  ointment (MYCOLOG) Apply 1 Application topically 2 (two) times daily.    ondansetron  (ZOFRAN -ODT) 4 MG disintegrating tablet Take 1 tablet (4 mg total) by mouth every 8 (eight) hours as needed.    pantoprazole  (PROTONIX ) 40 MG tablet Take 1 tablet (40 mg total) by mouth 2 (two) times daily before a meal. (Patient not taking: Reported on 07/10/2023)    senna (SENOKOT) 8.6 MG TABS tablet Take 1 tablet by mouth daily as needed for mild constipation.    tirzepatide  (MOUNJARO ) 7.5 MG/0.5ML Pen Inject 7.5mg  in the skin weekly.    tirzepatide  (MOUNJARO ) 7.5 MG/0.5ML Pen Inject 7.5 mg into the skin once a week.    valsartan -hydrochlorothiazide  (DIOVAN -HCT) 320-12.5 MG tablet Take 1 tablet by  mouth daily.    valsartan -hydrochlorothiazide  (DIOVAN -HCT) 320-12.5 MG tablet Take 1 tablet by mouth daily.    Vitamin D , Ergocalciferol , 50000 units CAPS Take 1 capsule by mouth once a week.    [DISCONTINUED] Phentermine -Topiramate  (QSYMIA ) 3.75-23 MG CP24 Take 1 tablet by mouth daily for two weeks, then take 2 tablets by mouth daily 05/03/2022: PA required/insurance won't cover and cash price is too high   No facility-administered encounter medications on file as of 09/23/2023.    PAST MEDICAL HISTORY: Past Medical History:  Diagnosis Date   Hyperlipidemia    Hypertension    Vitamin D  deficiency     PAST SURGICAL HISTORY: Past Surgical History:  Procedure Laterality Date   CESAREAN SECTION  01/21/2008   COLONOSCOPY     UPPER GI ENDOSCOPY      ALLERGIES: No Known Allergies  FAMILY HISTORY: Family History  Problem Relation Age of Onset   Hypertension Mother    High Cholesterol Mother    Diabetes Father    Liver disease Neg Hx    Esophageal cancer Neg Hx    Colon cancer Neg Hx    Pancreatic cancer Neg Hx    Stomach cancer Neg Hx     SOCIAL HISTORY: Social History   Tobacco Use   Smoking status: Never   Smokeless tobacco: Never  Vaping Use   Vaping status: Never Used  Substance Use Topics   Alcohol use: No   Drug use: No   Social History   Social History Narrative   Are you right handed or left handed? Right Handed   Are you currently employed ? 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 No   Who lives with you? Husband   What type of home do you live in: 1 story or 2 story? Lives in a one story home          Objective:  Vital Signs:  There were no vitals taken for this visit.  ***  Labs and Imaging review: New results: 09/11/23 (external labs):  CMP unremarkable CBC w/ diff unremarkable HbA1c: 5.6 Lipid panel: tChol 172, LDL 110, TG 57  Sleep study  (07/08/23):    Previously reviewed results: 06/29/23: B12: 477 Vit D wnl TSH: < 0.005, normal T3 and T4   06/24/23: HbA1c: 5.6 CMP significant for Cr 1.12 CBC w/ diff unremarkable   03/27/23: Vit D wnl TSH: < 0.005 Free T4: 2.66 (elevated) HbA1c: 6.0 Lipid panel: tChol 131, LDL 82, TG 66   Lumbar puncture (02/11/23): -Opening pressure 14 cm of H2O -Routine analysis: 2100 RBC, 1 WBC, 52 protein, 58 glucose   01/19/23:  TSH 0.025 Free T4, T3 wnl   B12 (01/09/23): 435   BMP (11/25/22): significant for Cr 1.07 ESR (04/30/20) wnl CBC (04/10/20) unremarkable HbA1c (01/31/20): 6.3 Lipid panel (01/23/20): tChol 194, LDL 129, TG 84   CT head wo contrast (11/27/22): Per my read: there appears to be a partially empty sella.   Radiology read: FINDINGS: Brain: No evidence of acute infarction, hemorrhage, hydrocephalus, extra-axial collection or mass lesion/mass effect.   Vascular: No hyperdense vessel or unexpected calcification.   Skull: Normal. Negative for fracture or focal lesion.   Sinuses/Orbits: No acute finding.   IMPRESSION: No acute intracranial process.   CT maxillofacial wo contrast (11/27/22): FINDINGS: Osseous: No fracture or mandibular dislocation. No destructive process.   Orbits: Negative. No traumatic or inflammatory finding.   Sinuses: Clear.   Soft tissues: Negative.   Limited intracranial: No significant or unexpected finding.   IMPRESSION: Unremarkable examination.  Assessment/Plan:  This is Marie Perez, a 48 y.o. female with: ***   Plan: ***  Return to clinic in ***  Total time spent reviewing records, interview, history/exam, documentation, and coordination of care on day of encounter:  *** min  Venetia Potters, MD

## 2023-09-23 ENCOUNTER — Encounter: Payer: Self-pay | Admitting: Neurology

## 2023-09-23 ENCOUNTER — Other Ambulatory Visit (HOSPITAL_BASED_OUTPATIENT_CLINIC_OR_DEPARTMENT_OTHER): Payer: Self-pay

## 2023-09-23 ENCOUNTER — Ambulatory Visit (INDEPENDENT_AMBULATORY_CARE_PROVIDER_SITE_OTHER): Admitting: Neurology

## 2023-09-23 VITALS — BP 114/72 | HR 88 | Ht 65.0 in | Wt 228.0 lb

## 2023-09-23 DIAGNOSIS — R519 Headache, unspecified: Secondary | ICD-10-CM

## 2023-09-23 DIAGNOSIS — M542 Cervicalgia: Secondary | ICD-10-CM | POA: Diagnosis not present

## 2023-09-23 DIAGNOSIS — G4486 Cervicogenic headache: Secondary | ICD-10-CM

## 2023-09-23 DIAGNOSIS — G4733 Obstructive sleep apnea (adult) (pediatric): Secondary | ICD-10-CM

## 2023-09-23 MED ORDER — NORTRIPTYLINE HCL 10 MG PO CAPS
50.0000 mg | ORAL_CAPSULE | Freq: Every day | ORAL | 11 refills | Status: AC
  Filled 2023-09-23 – 2024-01-18 (×2): qty 150, 30d supply, fill #0

## 2023-09-23 NOTE — Patient Instructions (Signed)
-  Recommend CPAP as planned  -Increase nortriptyline  to 50 mg at bedtime -Limit use of pain relievers to no more than 2 days out of week to prevent risk of rebound or medication-overuse headache. -Keep headache diary  Return to clinic in 6 months  The physicians and staff at [facility] are committed to providing excellent care. You may receive a survey requesting feedback about your experience at our office. We strive to receive very good responses to the survey questions. If you feel that your experience would prevent you from giving the office a very good  response, please contact our office to try to remedy the situation. We may be reached at [phone]. Thank you for taking the time out of your busy day to complete the survey.  Venetia Potters, MD [facility]

## 2023-09-24 ENCOUNTER — Other Ambulatory Visit (HOSPITAL_BASED_OUTPATIENT_CLINIC_OR_DEPARTMENT_OTHER): Payer: Self-pay

## 2023-09-24 ENCOUNTER — Encounter: Admitting: Family

## 2023-09-24 ENCOUNTER — Other Ambulatory Visit: Payer: Self-pay

## 2023-09-24 MED ORDER — MONTELUKAST SODIUM 10 MG PO TABS
10.0000 mg | ORAL_TABLET | Freq: Every day | ORAL | 1 refills | Status: AC
  Filled 2023-09-24: qty 90, 90d supply, fill #0
  Filled 2023-12-25: qty 90, 90d supply, fill #1

## 2023-09-25 ENCOUNTER — Telehealth (HOSPITAL_BASED_OUTPATIENT_CLINIC_OR_DEPARTMENT_OTHER): Payer: Self-pay | Admitting: Primary Care

## 2023-09-25 DIAGNOSIS — G4730 Sleep apnea, unspecified: Secondary | ICD-10-CM

## 2023-09-25 NOTE — Telephone Encounter (Signed)
 I don't mind ordering this, just please advise CPAP settings.

## 2023-09-25 NOTE — Telephone Encounter (Signed)
 Auto CPAP 5-15cm h20 with mask of choice and heated humidity

## 2023-09-28 NOTE — Telephone Encounter (Signed)
 Called and spoke with the pt. Pt was questioning how much insurance would cover for her machine. I advised DME will contact her and verify insurance info and then pricing will be discussed. Nfn

## 2023-09-28 NOTE — Telephone Encounter (Signed)
 ATC x1. Left message to call back regarding CPAP information.   Placing order per BW note.  Also sending Mychart message.

## 2023-09-30 ENCOUNTER — Other Ambulatory Visit (HOSPITAL_BASED_OUTPATIENT_CLINIC_OR_DEPARTMENT_OTHER): Payer: Self-pay

## 2023-10-06 ENCOUNTER — Other Ambulatory Visit (HOSPITAL_BASED_OUTPATIENT_CLINIC_OR_DEPARTMENT_OTHER): Payer: Self-pay

## 2023-10-15 ENCOUNTER — Other Ambulatory Visit: Payer: Self-pay

## 2023-10-15 ENCOUNTER — Other Ambulatory Visit (HOSPITAL_BASED_OUTPATIENT_CLINIC_OR_DEPARTMENT_OTHER): Payer: Self-pay

## 2023-10-16 NOTE — Telephone Encounter (Signed)
 Not needed if she is already on mounjaro , they are both GLP medications intended to aid in weight loss which in return and reduce total number of apneas per hour when speaking about sleep apnea

## 2023-10-16 NOTE — Telephone Encounter (Signed)
Please advise.  Marie Perez

## 2023-10-18 ENCOUNTER — Other Ambulatory Visit: Payer: Self-pay | Admitting: "Endocrinology

## 2023-10-18 ENCOUNTER — Other Ambulatory Visit (HOSPITAL_BASED_OUTPATIENT_CLINIC_OR_DEPARTMENT_OTHER): Payer: Self-pay

## 2023-10-19 ENCOUNTER — Other Ambulatory Visit (HOSPITAL_BASED_OUTPATIENT_CLINIC_OR_DEPARTMENT_OTHER): Payer: Self-pay

## 2023-10-19 ENCOUNTER — Other Ambulatory Visit: Payer: Self-pay

## 2023-10-19 MED ORDER — METHIMAZOLE 10 MG PO TABS
10.0000 mg | ORAL_TABLET | Freq: Two times a day (BID) | ORAL | 0 refills | Status: DC
  Filled 2023-10-19: qty 180, 90d supply, fill #0

## 2023-10-19 NOTE — Telephone Encounter (Signed)
 Her sleep apnea was mild, we can focus on weight loss for now- mounjaro  will help with weight loss  FU in 6 months

## 2023-10-19 NOTE — Telephone Encounter (Signed)
 Requested Prescriptions   Pending Prescriptions Disp Refills   methimazole  (TAPAZOLE ) 10 MG tablet 180 tablet 0    Sig: Take 1 tablet (10 mg total) by mouth 2 (two) times daily.

## 2023-10-20 ENCOUNTER — Other Ambulatory Visit: Payer: Self-pay

## 2023-10-20 DIAGNOSIS — E669 Obesity, unspecified: Secondary | ICD-10-CM | POA: Diagnosis not present

## 2023-10-20 DIAGNOSIS — F509 Eating disorder, unspecified: Secondary | ICD-10-CM | POA: Diagnosis not present

## 2023-10-20 DIAGNOSIS — Z713 Dietary counseling and surveillance: Secondary | ICD-10-CM | POA: Diagnosis not present

## 2023-10-20 DIAGNOSIS — F59 Unspecified behavioral syndromes associated with physiological disturbances and physical factors: Secondary | ICD-10-CM | POA: Diagnosis not present

## 2023-10-20 DIAGNOSIS — F1090 Alcohol use, unspecified, uncomplicated: Secondary | ICD-10-CM | POA: Diagnosis not present

## 2023-10-22 ENCOUNTER — Encounter: Payer: Self-pay | Admitting: "Endocrinology

## 2023-10-26 ENCOUNTER — Other Ambulatory Visit: Payer: Self-pay | Admitting: "Endocrinology

## 2023-10-26 ENCOUNTER — Other Ambulatory Visit (HOSPITAL_BASED_OUTPATIENT_CLINIC_OR_DEPARTMENT_OTHER): Payer: Self-pay

## 2023-10-26 DIAGNOSIS — E0500 Thyrotoxicosis with diffuse goiter without thyrotoxic crisis or storm: Secondary | ICD-10-CM

## 2023-10-26 MED ORDER — MOUNJARO 7.5 MG/0.5ML ~~LOC~~ SOAJ
7.5000 mg | SUBCUTANEOUS | 2 refills | Status: DC
  Filled 2023-10-26 – 2023-11-12 (×2): qty 2, 28d supply, fill #0
  Filled 2023-12-08: qty 2, 28d supply, fill #1
  Filled 2024-01-11: qty 2, 28d supply, fill #2

## 2023-10-26 NOTE — Telephone Encounter (Signed)
 I can send prescription in for Mounjaro  7.5mg  Wake weekly, she will need a visit in 2-3 months for weight management

## 2023-10-26 NOTE — Telephone Encounter (Signed)
 ATC no answer left VM

## 2023-10-26 NOTE — Telephone Encounter (Signed)
 Front staff, can you please schedule pt appt with Beth in 2-3 months.

## 2023-10-26 NOTE — Addendum Note (Signed)
 Addended by: HOPE ALMARIE ORN on: 10/26/2023 10:08 AM   Modules accepted: Orders

## 2023-10-27 NOTE — Telephone Encounter (Signed)
 ATC 2x left VM

## 2023-10-28 ENCOUNTER — Encounter: Payer: Self-pay | Admitting: Primary Care

## 2023-10-28 NOTE — Telephone Encounter (Signed)
 ATC 3x Sent LTR

## 2023-11-11 ENCOUNTER — Other Ambulatory Visit (HOSPITAL_BASED_OUTPATIENT_CLINIC_OR_DEPARTMENT_OTHER): Payer: Self-pay

## 2023-11-12 ENCOUNTER — Other Ambulatory Visit (HOSPITAL_BASED_OUTPATIENT_CLINIC_OR_DEPARTMENT_OTHER): Payer: Self-pay

## 2023-11-12 ENCOUNTER — Other Ambulatory Visit: Payer: Self-pay

## 2023-11-13 ENCOUNTER — Other Ambulatory Visit (HOSPITAL_BASED_OUTPATIENT_CLINIC_OR_DEPARTMENT_OTHER): Payer: Self-pay

## 2023-11-17 DIAGNOSIS — E0500 Thyrotoxicosis with diffuse goiter without thyrotoxic crisis or storm: Secondary | ICD-10-CM | POA: Diagnosis not present

## 2023-11-17 DIAGNOSIS — E0590 Thyrotoxicosis, unspecified without thyrotoxic crisis or storm: Secondary | ICD-10-CM | POA: Diagnosis not present

## 2023-11-18 LAB — T4, FREE: Free T4: 0.51 ng/dL — ABNORMAL LOW (ref 0.82–1.77)

## 2023-11-18 LAB — TSH: TSH: 6.74 u[IU]/mL — ABNORMAL HIGH (ref 0.450–4.500)

## 2023-11-18 LAB — T3, FREE: T3, Free: 2.7 pg/mL (ref 2.0–4.4)

## 2023-11-19 ENCOUNTER — Ambulatory Visit: Payer: Self-pay | Admitting: "Endocrinology

## 2023-11-19 ENCOUNTER — Encounter: Payer: Self-pay | Admitting: "Endocrinology

## 2023-11-19 ENCOUNTER — Telehealth (INDEPENDENT_AMBULATORY_CARE_PROVIDER_SITE_OTHER): Admitting: "Endocrinology

## 2023-11-19 ENCOUNTER — Other Ambulatory Visit: Payer: Self-pay | Admitting: Nurse Practitioner

## 2023-11-19 ENCOUNTER — Ambulatory Visit: Admitting: Neurology

## 2023-11-19 DIAGNOSIS — Z1231 Encounter for screening mammogram for malignant neoplasm of breast: Secondary | ICD-10-CM

## 2023-11-19 DIAGNOSIS — E042 Nontoxic multinodular goiter: Secondary | ICD-10-CM

## 2023-11-19 DIAGNOSIS — E0500 Thyrotoxicosis with diffuse goiter without thyrotoxic crisis or storm: Secondary | ICD-10-CM | POA: Diagnosis not present

## 2023-11-19 MED ORDER — METHIMAZOLE 10 MG PO TABS: 10.0000 mg | ORAL_TABLET | Freq: Every day | ORAL | Status: DC

## 2023-11-19 NOTE — Progress Notes (Signed)
 The patient reports they are currently: Marie Perez. I spent 5 minutes on the phone and video with the patient on the date of service. I spent an additional 5-10 minutes on pre- and post-visit activities on the date of service.   The patient was physically located in [place]  or a state in which I am permitted to provide care. The patient and/or parent/guardian understood that s/he may incur co-pays and cost sharing, and agreed to the telemedicine visit. The visit was reasonable and appropriate under the circumstances given the patient's presentation at the time.  The patient and/or parent/guardian has been advised of the potential risks and limitations of this mode of treatment (including, but not limited to, the absence of in-person examination) and has agreed to be treated using telemedicine. The patient's/patient's family's questions regarding telemedicine have been answered.   The patient and/or parent/guardian has also been advised to contact their provider's office for worsening conditions, and seek emergency medical treatment and/or call 911 if the patient deems either necessary.     Outpatient Endocrinology Note [name], MD  11/19/23   [name] 06-19-1975 [id number]  Referring Provider: Siganporia, Arnaz, FNP Primary Care Provider: Siganporia, Arnaz, FNP Subjective  No chief complaint on file.   Assessment & Plan  Diagnoses and all orders for this visit:  Graves disease -     TSH -     T3, free -     T4, free    FINDLEY BLANKENBAKER is currently on methimazole  10 mg bid. Started methimazole  10 mg tid since 05/22/23.  Patient was last biochemically hyperthyroid.  Educated on thyroid  axis.  11/19/23 Recommend the following: Continue methimazole  10 mg every day. Repeat lab before next visit or sooner if symptoms of hyperthyroidism or hypothyroidism develop.  Notify us  immediately in case of significant weight gain or loss. Counseled on compliance and follow up  needs.  Follow up with ophthalmology to discuss occasional eye concerns   03/2023 thyroid  ultrasound images and report reviewed Patient has multinodular goiter with none of the nodules meeting criteria for FNA at this point Had GI evaluation in past. Not interested in thyroidectomy  Recommend follow-up ultrasound in 1 year  I have reviewed current medications, nurse's notes, allergies, vital signs, past medical and surgical history, family medical history, and social history for this encounter. Counseled patient on symptoms, examination findings, lab findings, imaging results, treatment decisions and monitoring and prognosis. The patient understood the recommendations and agrees with the treatment plan. All questions regarding treatment plan were fully answered.   Return in about 6 weeks (around 12/31/2023) for visit + labs before next visit.   [name], MD  11/19/23   I have reviewed current medications, nurse's notes, allergies, vital signs, past medical and surgical history, family medical history, and social history for this encounter. Counseled patient on symptoms, examination findings, lab findings, imaging results, treatment decisions and monitoring and prognosis. The patient understood the recommendations and agrees with the treatment plan. All questions regarding treatment plan were fully answered.   History of Present Illness Marie Perez is a 48 y.o. year old female who presents to our clinic with hyperthyroidism diagnosed in 12/2022.    On Methimazole  10 mg tid since 05/22/23  Symptoms suggestive of HYPOTHYROIDISM:  fatigue Yes, some days weight gain No cold intolerance  No constipation  Yes  Symptoms suggestive of HYPERTHYROIDISM:  weight loss  Yes, on mounjaro  + changed eating habits  heat intolerance Yes hyperdefecation  No palpitations  No  Compressive symptoms:  dysphagia  Yes, sometimes. Had GI evaluation in past. Not interested in thyroidectomy   dysphonia  Yes, sometimes  positional dyspnea (especially with simultaneous arms elevation)  No  Smokes  No On biotin  No Personal history of head/neck surgery/irradiation  No  Grave's Ophthalmopathy Clinical Activity Score: No concerns reported  Adverse Drug Effects from Methimazole  (MMI): rash No fever No throat pain No arthritis No mouth ulcers No jaundice No loss of appetite No lymphadenopathy No   03/2023 THYROID  ULTRASOUND   TECHNIQUE: Ultrasound examination of the thyroid  gland and adjacent soft tissues was performed.   COMPARISON:  None Available.   FINDINGS: Parenchymal Echotexture: Mildly heterogenous   Isthmus: 0.9 cm   Right lobe: 6.7 x 3.3 x 2.2 cm   Left lobe: 6.1 x 2.4 x 2.3 cm   _________________________________________________________   Estimated total number of nodules >/= 1 cm: 4   Number of spongiform nodules >/=  2 cm not described below (TR1): 0   Number of mixed cystic and solid nodules >/= 1.5 cm not described below (TR2): 0   _________________________________________________________   Heterogeneous and enlarged thyroid  gland containing nearly innumerable tiny cysts and nodules. The vast majority of the lesions are less than 5 mm in size and warrant no discrete description. Additional nodules as below.   Nodule # 1: Isoechoic solid nodule in the right upper gland measures 1.3 x 0.8 x 1.1 cm. Findings are consistent with TI-RADS category 3. Given size (<1.4 cm) and appearance, this nodule does NOT meet TI-RADS criteria for biopsy or dedicated follow-up.   Nodule # 2: Mixed cystic and solid nodule in the right mid gland. TI-RADS category 2. This nodule does NOT meet TI-RADS criteria for biopsy or dedicated follow-up.   Nodule # 3: Spongiform nodule in the left mid gland measures 1.1 cm. TI-RADS category 2. This nodule does NOT meet TI-RADS criteria for biopsy or dedicated follow-up.   Nodule # 4: Mixed cystic and solid nodule in  the left lower gland measures up to 1.1 cm. TI-RADS category 2. This nodule does NOT meet TI-RADS criteria for biopsy or dedicated follow-up.   IMPRESSION: 1. Mildly enlarged thyroid  gland containing nearly innumerable tiny thyroid  cysts and nodules. 2. No individual nodule meets criteria to recommend biopsy or imaging follow-up.  Physical Exam  There were no vitals taken for this visit. Constitutional: well developed, well nourished Head: normocephalic, atraumatic, no exophthalmos Eyes: sclera anicteric, no redness Neck: + thyromegaly, no thyroid  tenderness; no nodules palpated Lungs: normal respiratory effort Neurology: alert and oriented, no fine hand tremor Skin: dry, no appreciable rashes Musculoskeletal: no appreciable defects Psychiatric: normal mood and affect  Allergies No Known Allergies  Current Medications Patient's Medications  New Prescriptions   No medications on file  Previous Medications   AMLODIPINE  (NORVASC ) 5 MG TABLET    Take 1 tablet (5 mg total) by mouth daily.   AMLODIPINE  (NORVASC ) 5 MG TABLET    Take 1 tablet (5 mg total) by mouth daily.   ATORVASTATIN  (LIPITOR) 20 MG TABLET    Take 1 tablet (20 mg total) by mouth daily.   ATORVASTATIN  (LIPITOR) 20 MG TABLET    Take 1 tablet (20 mg total) by mouth daily.   CETIRIZINE  (ZYRTEC ) 10 MG TABLET    Take 1 tablet by mouth every day   ESOMEPRAZOLE  (NEXIUM ) 40 MG CAPSULE    Take 1 capsule (40 mg total) by mouth 2 (two) times daily before a meal.  ESOMEPRAZOLE  (NEXIUM ) 40 MG CAPSULE    Take 1 capsule (40 mg total) by mouth in the morning before breakfast.   FAMOTIDINE  (PEPCID ) 40 MG TABLET    Take 1 tablet (40 mg total) by mouth at bedtime.   FLUCONAZOLE  (DIFLUCAN ) 150 MG TABLET    Take 1 tablet (150 mg total) by mouth as directed.   LINACLOTIDE  (LINZESS ) 290 MCG CAPS CAPSULE    Take 1 capsule (290 mcg total) by mouth daily at least 30 minutes before the first meal of the day on an empty stomach.   LINZESS  290  MCG CAPS CAPSULE    Take 1 capsule (290 mcg total) by mouth daily before a meal. Take on an empty stomach, at least 30 minutes prior to a meal, and at approximately the same time each day.   METHIMAZOLE  (TAPAZOLE ) 10 MG TABLET    Take 1 tablet (10 mg total) by mouth 2 (two) times daily.   MONTELUKAST  (SINGULAIR ) 10 MG TABLET    Take 1 tablet (10 mg total) by mouth daily.   MONTELUKAST  (SINGULAIR ) 10 MG TABLET    Take 1 tablet (10 mg total) by mouth at bedtime.   NORTRIPTYLINE  (PAMELOR ) 10 MG CAPSULE    Take 5 capsules (50 mg total) by mouth at bedtime.   NYSTATIN  POWDER    Apply 1 Application topically 3 (three) times daily.   NYSTATIN -TRIAMCINOLONE  OINTMENT (MYCOLOG)    Apply 1 Application topically 2 (two) times daily.   ONDANSETRON  (ZOFRAN -ODT) 4 MG DISINTEGRATING TABLET    Take 1 tablet (4 mg total) by mouth every 8 (eight) hours as needed.   PANTOPRAZOLE  (PROTONIX ) 40 MG TABLET    Take 1 tablet (40 mg total) by mouth 2 (two) times daily before a meal.   SENNA (SENOKOT) 8.6 MG TABS TABLET    Take 1 tablet by mouth daily as needed for mild constipation.   TIRZEPATIDE  (MOUNJARO ) 7.5 MG/0.5ML PEN    Inject 7.5 mg into the skin once a week.   VALSARTAN -HYDROCHLOROTHIAZIDE  (DIOVAN -HCT) 320-12.5 MG TABLET    Take 1 tablet by mouth daily.   VALSARTAN -HYDROCHLOROTHIAZIDE  (DIOVAN -HCT) 320-12.5 MG TABLET    Take 1 tablet by mouth daily.   VITAMIN D , ERGOCALCIFEROL , 50000 UNITS CAPS    Take 1 capsule by mouth once a week.  Modified Medications   No medications on file  Discontinued Medications   No medications on file    Past Medical History Past Medical History:  Diagnosis Date   Hyperlipidemia    Hypertension    Vitamin D  deficiency     Past Surgical History Past Surgical History:  Procedure Laterality Date   CESAREAN SECTION  01/21/2008   COLONOSCOPY     UPPER GI ENDOSCOPY      Family History family history includes Diabetes in her father; High Cholesterol in her mother; Hypertension  in her mother.  Social History Social History   Socioeconomic History   Marital status: Married    Spouse name: Not on file   Number of children: 1   Years of education: Not on file   Highest education level: Associate degree: occupational, scientist, product/process development, or vocational program  Occupational History   Occupation: Archivist  Tobacco Use   Smoking status: Never   Smokeless tobacco: Never  Vaping Use   Vaping status: Never Used  Substance and Sexual Activity   Alcohol use: No   Drug use: No   Sexual activity: Yes    Birth control/protection: None  Other Topics Concern  Not on file  Social History Narrative   Are you right handed or left handed? Right Handed   Are you currently employed ? 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 No   Who lives with you? Husband   What type of home do you live in: 1 story or 2 story? Lives in a one story home       Social Drivers of Health   Financial Resource Strain: Low Risk  (01/15/2023)   Overall Financial Resource Strain (CARDIA)    Difficulty of Paying Living Expenses: Not hard at all  Food Insecurity: Low Risk  (09/11/2023)   Received from [facility]   Hunger Vital Sign    Within the past 12 months, you worried that your food would run out before you got money to buy more: Never true    Within the past 12 months, the food you bought just didn't last and you didn't have money to get more. : Never true  Transportation Needs: No Transportation Needs (09/11/2023)   Received from [facility]   Transportation    In the past 12 months, has lack of reliable transportation kept you from medical appointments, meetings, work or from getting things needed for daily living? : No  Physical Activity: Insufficiently Active (01/15/2023)   Exercise Vital Sign    Days of Exercise per Week: 2 days    Minutes of Exercise per Session: 10 min  Stress: No Stress Concern Present  (01/15/2023)   [employer] of Occupational Health - Occupational Stress Questionnaire    Feeling of Stress : Not at all  Social Connections: Moderately Integrated (01/15/2023)   Social Connection and Isolation Panel    Frequency of Communication with Friends and Family: Twice a week    Frequency of Social Gatherings with Friends and Family: Once a week    Attends Religious Services: More than 4 times per year    Active Member of [employer] or Organizations: No    Attends Banker Meetings: Not on file    Marital Status: Married  Catering Manager Violence: Not At Risk (03/27/2023)   Humiliation, Afraid, Rape, and Kick questionnaire    Fear of Current or Ex-Partner: No    Emotionally Abused: No    Physically Abused: No    Sexually Abused: No    Laboratory Investigations Lab Results  Component Value Date   TSH 6.740 (H) 11/17/2023   TSH <0.005 (L) 06/29/2023   TSH <0.005 (L) 06/03/2023   FREET4 0.51 (L) 11/17/2023   FREET4 1.31 06/29/2023   FREET4 2.08 (H) 06/03/2023     Lab Results  Component Value Date   TSI 209 (H) 05/21/2023     No components found for: TRAB   Lab Results  Component Value Date   CHOL 131 03/27/2023   Lab Results  Component Value Date   HDL 35 (L) 03/27/2023   Lab Results  Component Value Date   LDLCALC 82 03/27/2023   Lab Results  Component Value Date   TRIG 66 03/27/2023   Lab Results  Component Value Date   CHOLHDL 3.7 03/27/2023   Lab Results  Component Value Date   CREATININE 1.12 (H) 06/24/2023   Lab Results  Component Value Date   GFR 73.43 04/09/2023      Component Value Date/Time   NA 137 06/24/2023 0815   K 3.9 06/24/2023 0815   CL 102  06/24/2023 0815   CO2 16 (L) 06/24/2023 0815   GLUCOSE 91 06/24/2023 0815   GLUCOSE 97 04/09/2023 0832   BUN 13 06/24/2023 0815   CREATININE 1.12 (H) 06/24/2023 0815   CALCIUM  9.4 06/24/2023 0815   PROT 7.2 06/24/2023 0815   ALBUMIN 4.3 06/24/2023 0815   AST 14 06/24/2023  0815   ALT 14 06/24/2023 0815   ALKPHOS 104 06/24/2023 0815   BILITOT 0.6 06/24/2023 0815   GFRNONAA 54 (L) 01/23/2020 1607   GFRAA 62 01/23/2020 1607      Latest Ref Rng & Units 06/24/2023    8:15 AM 04/09/2023    8:32 AM 03/27/2023    1:17 PM  BMP  Glucose 70 - 99 mg/dL 91  97  91   BUN 6 - 24 mg/dL 13  12  9    Creatinine 0.57 - 1.00 mg/dL 8.87  9.07  8.90   BUN/Creat Ratio 9 - 23 12   8    Sodium 134 - 144 mmol/L 137  136  137   Potassium 3.5 - 5.2 mmol/L 3.9  3.7  4.0   Chloride 96 - 106 mmol/L 102  103  104   CO2 20 - 29 mmol/L 16  26  18    Calcium  8.7 - 10.2 mg/dL 9.4  9.6  9.5        Component Value Date/Time   WBC 7.7 06/24/2023 0815   WBC 9.4 04/10/2020 1817   RBC 5.02 06/24/2023 0815   RBC 4.90 04/10/2020 1817   HGB 14.7 06/24/2023 0815   HCT 44.0 06/24/2023 0815   PLT 348 06/24/2023 0815   MCV 88 06/24/2023 0815   MCH 29.3 06/24/2023 0815   MCH 29.6 04/10/2020 1817   MCHC 33.4 06/24/2023 0815   MCHC 34.1 04/10/2020 1817   RDW 13.7 06/24/2023 0815   LYMPHSABS 2.0 06/24/2023 0815   MONOABS 0.6 04/10/2020 1817   EOSABS 0.1 06/24/2023 0815   BASOSABS 0.0 06/24/2023 0815      Parts of this note may have been dictated using voice recognition software. There may be variances in spelling and vocabulary which are unintentional. Not all errors are proofread. Please notify the dino if any discrepancies are noted or if the meaning of any statement is not clear.

## 2023-11-24 ENCOUNTER — Ambulatory Visit: Admitting: "Endocrinology

## 2023-11-30 ENCOUNTER — Other Ambulatory Visit: Payer: Self-pay | Admitting: Internal Medicine

## 2023-11-30 ENCOUNTER — Other Ambulatory Visit (HOSPITAL_BASED_OUTPATIENT_CLINIC_OR_DEPARTMENT_OTHER): Payer: Self-pay

## 2023-11-30 ENCOUNTER — Other Ambulatory Visit: Payer: Self-pay

## 2023-12-01 ENCOUNTER — Other Ambulatory Visit (HOSPITAL_BASED_OUTPATIENT_CLINIC_OR_DEPARTMENT_OTHER): Payer: Self-pay

## 2023-12-01 MED ORDER — ESOMEPRAZOLE MAGNESIUM 40 MG PO CPDR
40.0000 mg | DELAYED_RELEASE_CAPSULE | Freq: Two times a day (BID) | ORAL | 3 refills | Status: AC
  Filled 2023-12-01 – 2024-01-03 (×3): qty 60, 30d supply, fill #0

## 2023-12-09 ENCOUNTER — Telehealth: Admitting: Physician Assistant

## 2023-12-09 ENCOUNTER — Other Ambulatory Visit (HOSPITAL_BASED_OUTPATIENT_CLINIC_OR_DEPARTMENT_OTHER): Payer: Self-pay

## 2023-12-09 DIAGNOSIS — J019 Acute sinusitis, unspecified: Secondary | ICD-10-CM

## 2023-12-09 DIAGNOSIS — B9789 Other viral agents as the cause of diseases classified elsewhere: Secondary | ICD-10-CM

## 2023-12-09 MED ORDER — PREDNISONE 10 MG (21) PO TBPK
ORAL_TABLET | ORAL | 0 refills | Status: AC
  Filled 2023-12-09: qty 21, 6d supply, fill #0

## 2023-12-09 MED ORDER — PROMETHAZINE-DM 6.25-15 MG/5ML PO SYRP
5.0000 mL | ORAL_SOLUTION | Freq: Four times a day (QID) | ORAL | 0 refills | Status: AC | PRN
  Filled 2023-12-09: qty 118, 6d supply, fill #0

## 2023-12-09 NOTE — Progress Notes (Signed)
 Virtual Visit Consent   Marie Perez, you are scheduled for a virtual visit with a [facility] provider today. Just as with appointments in the office, your consent must be obtained to participate. Your consent will be active for this visit and any virtual visit you may have with one of our providers in the next 365 days. If you have a MyChart account, a copy of this consent can be sent to you electronically.  As this is a virtual visit, video technology does not allow for your provider to perform a traditional examination. This may limit your provider's ability to fully assess your condition. If your provider identifies any concerns that need to be evaluated in person or the need to arrange testing (such as labs, EKG, etc.), we will make arrangements to do so. Although advances in technology are sophisticated, we cannot ensure that it will always work on either your end or our end. If the connection with a video visit is poor, the visit may have to be switched to a telephone visit. With either a video or telephone visit, we are not always able to ensure that we have a secure connection.  By engaging in this virtual visit, you consent to the provision of healthcare and authorize for your insurance to be billed (if applicable) for the services provided during this visit. Depending on your insurance coverage, you may receive a charge related to this service.  I need to obtain your verbal consent now. Are you willing to proceed with your visit today? Marie Perez has provided verbal consent on 12/09/2023 for a virtual visit (video or telephone). Marie Perez, [place]  Date: 12/09/2023 11:19 AM   Virtual Visit via Video Note   I, Marie Perez, connected with  Marie Perez  ([id number], 06-27-1975) on 12/09/23 at 11:15 AM EST by a video-enabled telemedicine application and verified that I am speaking with the correct person using two identifiers.  Location: Patient: Virtual Visit  Location Patient: Home Provider: Virtual Visit Location Provider: Home Office   I discussed the limitations of evaluation and management by telemedicine and the availability of in person appointments. The patient expressed understanding and agreed to proceed.    History of Present Illness: Marie Perez is a 48 y.o. who identifies as a female who was assigned female at birth, and is being seen today for several days of nasal congestion, sinus pressure and headache. Feels symptoms are improving but still with substantial sinus pressure and congestion. Denies fever, chills. Only a mild cough. Is taking her routine Singulair  and Flonase . Tested for COVID and influenza which were both negative.   HPI: HPI  Problems:  Patient Active Problem List   Diagnosis Date Noted   Hyperthyroidism 06/29/2023   Anxiety about health 06/29/2023   GERD (gastroesophageal reflux disease) 06/29/2023   Blood creatinine increased compared with prior measurement 06/29/2023   Need for vaccination 06/29/2023   Hyperlipidemia 03/27/2023   Seasonal allergies 03/27/2023   Chronic constipation 03/27/2023   Vitamin D  deficiency 03/27/2023   Musculoskeletal pain 12/24/2010   Sinusitis 05/01/2010   CARPAL TUNNEL SYNDROME, RIGHT 04/05/2010   FATIGUE 02/27/2010   Fatigue 02/27/2010   FREQUENCY, URINARY 11/26/2009   Obesity, unspecified 10/31/2008   Essential hypertension 10/31/2008   UTI 10/31/2008   Vaginitis and vulvovaginitis 10/31/2008   Goiter 01/01/2000    Allergies: No Known Allergies Medications:  Current Outpatient Medications:    predniSONE  (STERAPRED UNI-PAK 21 TAB) 10 MG (21) TBPK tablet, Take  following package directions, Disp: 21 tablet, Rfl: 0   promethazine -dextromethorphan (PROMETHAZINE -DM) 6.25-15 MG/5ML syrup, Take 5 mLs by mouth 4 (four) times daily as needed for cough., Disp: 118 mL, Rfl: 0   amLODipine  (NORVASC ) 5 MG tablet, Take 1 tablet (5 mg total) by mouth daily., Disp: 90 tablet, Rfl:  3   amLODipine  (NORVASC ) 5 MG tablet, Take 1 tablet (5 mg total) by mouth daily., Disp: 90 tablet, Rfl: 0   atorvastatin  (LIPITOR) 20 MG tablet, Take 1 tablet (20 mg total) by mouth daily., Disp: 90 tablet, Rfl: 1   atorvastatin  (LIPITOR) 20 MG tablet, Take 1 tablet (20 mg total) by mouth daily., Disp: 90 tablet, Rfl: 0   cetirizine  (ZYRTEC ) 10 MG tablet, Take 1 tablet by mouth every day, Disp: 90 tablet, Rfl: 3   esomeprazole  (NEXIUM ) 40 MG capsule, Take 1 capsule (40 mg total) by mouth in the morning before breakfast., Disp: 90 capsule, Rfl: 0   esomeprazole  (NEXIUM ) 40 MG capsule, Take 1 capsule (40 mg total) by mouth 2 (two) times daily before a meal., Disp: 60 capsule, Rfl: 3   famotidine  (PEPCID ) 40 MG tablet, Take 1 tablet (40 mg total) by mouth at bedtime., Disp: 90 tablet, Rfl: 2   fluconazole  (DIFLUCAN ) 150 MG tablet, Take 1 tablet (150 mg total) by mouth as directed., Disp: 1 tablet, Rfl: 1   linaclotide  (LINZESS ) 290 MCG CAPS capsule, Take 1 capsule (290 mcg total) by mouth daily at least 30 minutes before the first meal of the day on an empty stomach., Disp: 90 capsule, Rfl: 1   LINZESS  290 MCG CAPS capsule, Take 1 capsule (290 mcg total) by mouth daily before a meal. Take on an empty stomach, at least 30 minutes prior to a meal, and at approximately the same time each day., Disp: 90 capsule, Rfl: 0   methimazole  (TAPAZOLE ) 10 MG tablet, Take 1 tablet (10 mg total) by mouth daily., Disp: , Rfl:    montelukast  (SINGULAIR ) 10 MG tablet, Take 1 tablet (10 mg total) by mouth daily., Disp: 90 tablet, Rfl: 3   montelukast  (SINGULAIR ) 10 MG tablet, Take 1 tablet (10 mg total) by mouth at bedtime., Disp: 90 tablet, Rfl: 1   nortriptyline  (PAMELOR ) 10 MG capsule, Take 5 capsules (50 mg total) by mouth at bedtime., Disp: 150 capsule, Rfl: 11   nystatin  powder, Apply 1 Application topically 3 (three) times daily., Disp: 60 g, Rfl: 1   nystatin -triamcinolone  ointment (MYCOLOG), Apply 1 Application  topically 2 (two) times daily., Disp: 30 g, Rfl: 2   ondansetron  (ZOFRAN -ODT) 4 MG disintegrating tablet, Take 1 tablet (4 mg total) by mouth every 8 (eight) hours as needed., Disp: 20 tablet, Rfl: 0   pantoprazole  (PROTONIX ) 40 MG tablet, Take 1 tablet (40 mg total) by mouth 2 (two) times daily before a meal., Disp: 60 tablet, Rfl: 3   senna (SENOKOT) 8.6 MG TABS tablet, Take 1 tablet by mouth daily as needed for mild constipation., Disp: , Rfl:    tirzepatide  (MOUNJARO ) 7.5 MG/0.5ML Pen, Inject 7.5 mg into the skin once a week., Disp: 2 mL, Rfl: 2   valsartan -hydrochlorothiazide  (DIOVAN -HCT) 320-12.5 MG tablet, Take 1 tablet by mouth daily., Disp: 90 tablet, Rfl: 1   valsartan -hydrochlorothiazide  (DIOVAN -HCT) 320-12.5 MG tablet, Take 1 tablet by mouth daily., Disp: 90 tablet, Rfl: 0   Vitamin D , Ergocalciferol , 50000 units CAPS, Take 1 capsule by mouth once a week., Disp: 12 capsule, Rfl: 3  Observations/Objective: Patient is well-developed, well-nourished in no acute  distress.  Resting comfortably  at home.  Head is normocephalic, atraumatic.  No labored breathing.  Speech is clear and coherent with logical content.  Patient is alert and oriented at baseline.   Assessment and Plan: 1. Acute viral sinusitis (Primary) - predniSONE  (STERAPRED UNI-PAK 21 TAB) 10 MG (21) TBPK tablet; Take following package directions  Dispense: 21 tablet; Refill: 0 - promethazine -dextromethorphan (PROMETHAZINE -DM) 6.25-15 MG/5ML syrup; Take 5 mLs by mouth 4 (four) times daily as needed for cough.  Dispense: 118 mL; Refill: 0  Symptoms improving but still moderate. Supportive measures and OTC medications reviewed. Patient to restart her Cetirizine  and continue Flonase  and Singulair . Sterapred pack and promethazine -DM per orders. Follow-up in person for any non-resolving, new or worsening symptoms despite treatment.  Follow Up Instructions: I discussed the assessment and treatment plan with the patient. The  patient was provided an opportunity to ask questions and all were answered. The patient agreed with the plan and demonstrated an understanding of the instructions.  A copy of instructions were sent to the patient via MyChart unless otherwise noted below.   The patient was advised to call back or seek an in-person evaluation if the symptoms worsen or if the condition fails to improve as anticipated.    Marie Velma Lunger, PA-C

## 2023-12-09 NOTE — Patient Instructions (Signed)
 Marie Perez, thank you for joining [name], PA-C for today's virtual visit.  While this provider is not your primary care provider (PCP), if your PCP is located in our provider database this encounter information will be shared with them immediately following your visit.   A [facility] MyChart account gives you access to today's visit and all your visits, tests, and labs performed at [facility]  click here if you don't have a [facility] MyChart account or go to [email or web address]  Consent: (Patient) Marie Perez provided verbal consent for this virtual visit at the beginning of the encounter.  Current Medications:  Current Outpatient Medications:    amLODipine  (NORVASC ) 5 MG tablet, Take 1 tablet (5 mg total) by mouth daily., Disp: 90 tablet, Rfl: 3   amLODipine  (NORVASC ) 5 MG tablet, Take 1 tablet (5 mg total) by mouth daily., Disp: 90 tablet, Rfl: 0   atorvastatin  (LIPITOR) 20 MG tablet, Take 1 tablet (20 mg total) by mouth daily., Disp: 90 tablet, Rfl: 1   atorvastatin  (LIPITOR) 20 MG tablet, Take 1 tablet (20 mg total) by mouth daily., Disp: 90 tablet, Rfl: 0   cetirizine  (ZYRTEC ) 10 MG tablet, Take 1 tablet by mouth every day, Disp: 90 tablet, Rfl: 3   esomeprazole  (NEXIUM ) 40 MG capsule, Take 1 capsule (40 mg total) by mouth in the morning before breakfast., Disp: 90 capsule, Rfl: 0   esomeprazole  (NEXIUM ) 40 MG capsule, Take 1 capsule (40 mg total) by mouth 2 (two) times daily before a meal., Disp: 60 capsule, Rfl: 3   famotidine  (PEPCID ) 40 MG tablet, Take 1 tablet (40 mg total) by mouth at bedtime., Disp: 90 tablet, Rfl: 2   fluconazole  (DIFLUCAN ) 150 MG tablet, Take 1 tablet (150 mg total) by mouth as directed., Disp: 1 tablet, Rfl: 1   linaclotide  (LINZESS ) 290 MCG CAPS capsule, Take 1 capsule (290 mcg total) by mouth daily at least 30 minutes before the first meal of the day on an empty stomach., Disp: 90 capsule, Rfl: 1   LINZESS  290 MCG  CAPS capsule, Take 1 capsule (290 mcg total) by mouth daily before a meal. Take on an empty stomach, at least 30 minutes prior to a meal, and at approximately the same time each day., Disp: 90 capsule, Rfl: 0   methimazole  (TAPAZOLE ) 10 MG tablet, Take 1 tablet (10 mg total) by mouth daily., Disp: , Rfl:    montelukast  (SINGULAIR ) 10 MG tablet, Take 1 tablet (10 mg total) by mouth daily., Disp: 90 tablet, Rfl: 3   montelukast  (SINGULAIR ) 10 MG tablet, Take 1 tablet (10 mg total) by mouth at bedtime., Disp: 90 tablet, Rfl: 1   nortriptyline  (PAMELOR ) 10 MG capsule, Take 5 capsules (50 mg total) by mouth at bedtime., Disp: 150 capsule, Rfl: 11   nystatin  powder, Apply 1 Application topically 3 (three) times daily., Disp: 60 g, Rfl: 1   nystatin -triamcinolone  ointment (MYCOLOG), Apply 1 Application topically 2 (two) times daily., Disp: 30 g, Rfl: 2   ondansetron  (ZOFRAN -ODT) 4 MG disintegrating tablet, Take 1 tablet (4 mg total) by mouth every 8 (eight) hours as needed., Disp: 20 tablet, Rfl: 0   pantoprazole  (PROTONIX ) 40 MG tablet, Take 1 tablet (40 mg total) by mouth 2 (two) times daily before a meal., Disp: 60 tablet, Rfl: 3   senna (SENOKOT) 8.6 MG TABS tablet, Take 1 tablet by mouth daily as needed for mild constipation., Disp: , Rfl:    tirzepatide  (MOUNJARO ) 7.5 MG/0.5ML Pen,  Inject 7.5 mg into the skin once a week., Disp: 2 mL, Rfl: 2   valsartan -hydrochlorothiazide  (DIOVAN -HCT) 320-12.5 MG tablet, Take 1 tablet by mouth daily., Disp: 90 tablet, Rfl: 1   valsartan -hydrochlorothiazide  (DIOVAN -HCT) 320-12.5 MG tablet, Take 1 tablet by mouth daily., Disp: 90 tablet, Rfl: 0   Vitamin D , Ergocalciferol , 50000 units CAPS, Take 1 capsule by mouth once a week., Disp: 12 capsule, Rfl: 3   Medications ordered in this encounter:  No orders of the defined types were placed in this encounter.    *If you need refills on other medications prior to your next appointment, please contact your  pharmacy*  Follow-Up: Call back or seek an in-person evaluation if the symptoms worsen or if the condition fails to improve as anticipated.  [facility] [phone]  Other Instructions Please hydrate and rest. Start a saline nasal rinse.  Continue your Singulair  and Flonase  as directed. Restart your Cetirizine . Take the prescribed medications as directed. If you note any non-resolving, new, or worsening symptoms despite treatment, please seek an in-person evaluation ASAP.    If you have been instructed to have an in-person evaluation today at a local Urgent Care facility, please use the link below. It will take you to a list of all of our available [facility], including address, phone number and hours of operation. Please do not delay care.  [facility]  If you or a family member do not have a primary care provider, use the link below to schedule a visit and establish care. When you choose a [employer] primary care physician or advanced practice provider, you gain a long-term partner in health. Find a Primary Care Provider  Learn more about [facility]'s in-office and virtual care options: [facility] - Get Care Now

## 2023-12-14 ENCOUNTER — Other Ambulatory Visit (HOSPITAL_BASED_OUTPATIENT_CLINIC_OR_DEPARTMENT_OTHER): Payer: Self-pay

## 2023-12-25 ENCOUNTER — Other Ambulatory Visit: Payer: Self-pay

## 2023-12-25 ENCOUNTER — Other Ambulatory Visit (HOSPITAL_BASED_OUTPATIENT_CLINIC_OR_DEPARTMENT_OTHER): Payer: Self-pay

## 2023-12-25 ENCOUNTER — Other Ambulatory Visit: Payer: Self-pay | Admitting: Family Medicine

## 2023-12-29 ENCOUNTER — Encounter (HOSPITAL_BASED_OUTPATIENT_CLINIC_OR_DEPARTMENT_OTHER): Payer: Self-pay

## 2023-12-29 ENCOUNTER — Ambulatory Visit (HOSPITAL_BASED_OUTPATIENT_CLINIC_OR_DEPARTMENT_OTHER)

## 2023-12-29 ENCOUNTER — Ambulatory Visit (INDEPENDENT_AMBULATORY_CARE_PROVIDER_SITE_OTHER)

## 2023-12-29 VITALS — BP 112/76 | HR 83 | Ht 65.0 in | Wt 227.0 lb

## 2023-12-29 DIAGNOSIS — G4733 Obstructive sleep apnea (adult) (pediatric): Secondary | ICD-10-CM | POA: Diagnosis not present

## 2023-12-29 NOTE — Progress Notes (Signed)
 @Patient  ID: [name], female    DOB: Jan 27, 1975, 48 y.o.   MRN: [id number]  Chief Complaint  Patient presents with   Sleep Apnea    Follow up     Referring provider: Siganporia, Arnaz, FNP  HPI: Discussed the use of AI scribe software for clinical note transcription with the patient, who gave verbal consent to proceed.  History of Present Illness Eleri Ruben Alizaya Oshea is a 48 year old female with mild sleep apnea who presents for follow-up on her sleep study results and weight management.  She underwent a home sleep test in late June to early July, which revealed mild sleep apnea with an average of 13.7 events per hour and a lowest oxygen saturation of 76%, though not sustained. She experiences improved sleep quality, no longer waking up at 3 AM as she did for two years, and now sleeps until 4 or 5 AM. Her husband notes decreased snoring. She feels less tired than before, although she experienced increased fatigue last week.  She attributes this improvement to weight loss achieved through a combination of diet, exercise, and use of Mounjaro .  She is currently on Mounjaro  for weight loss, having started at 260 pounds and now weighing 227 pounds. She is on a 7.5 mg dose. Her eating habits have changed, she gets full quickly, and sometimes has to remind herself to eat due to being busy. She has started going to the gym, which has contributed to her improved sleep.  She has a history of hyperthyroidism and is on methimazole . The medication causes nausea and swelling in her foot, but she does not experience constipation.  Last OV 04/14/2023: 48 year old, never smoker. PMH significant HTN, goiter, chronic constipation, fatigue, hyperlipidemia, seasonal allergies, vit D deficiency.    04/14/2023 Discussed the use of AI scribe software for clinical note transcription with the patient, who gave verbal consent to proceed.   History of Present Illness   Sammye Staff Avelyn Touch is a  48 year old female who presents with trouble sleeping at night. She was referred by neurology for evaluation of hypersomnia and daytime fatigue.   She experiences difficulty sleeping at night, specifically waking up around 3 AM and being unable to fall back asleep. She typically goes to bed at 10 PM and starts her day at 7 AM. No loud snoring that disturbs her husband, and he has not witnessed apneic events, although he occasionally nudges her to stop snoring when she is very tired. She primarily sleeps on her side. She has not had a sleep study before, although one was ordered in 2024 but not completed.   She has been experiencing hypersomnia and daytime fatigue, which led to her referral to the sleep clinic. No history of congestive heart failure, myocardial infarction, COPD, asthma, seizure disorders, sleepwalking, or restless leg symptoms.   She has a history of migraines and is currently on nortriptyline , taking three capsules by mouth at bedtime for the past two months. The medication has improved her migraines, although she experienced a severe headache upon waking recently.   Her weight has decreased from 260 pounds to 240 pounds over the past year. She is currently on Mounjaro  7.5 mg once a week, which is covered by her insurance. She has not reached her goal weight yet.    No concern for narcolepsy, cataplexy or sleep walking. [facility] 7/24.     TEST/EVENTS : HST 07/22/2023:  AHI 13.7/hr with O2 sat nadir 76%  Not on  File  Immunization History  Administered Date(s) Administered   Fluzone Influenza virus vaccine,trivalent (IIV3), split virus 02/20/2022   Influenza Inj Mdck Quad With Preservative 11/08/2018   Influenza Nasal 12/06/2019   Influenza Whole 10/20/2009   Influenza, Seasonal, Injecte, Preservative Fre 10/28/2022   PFIZER(Purple Top)SARS-COV-2 Vaccination 04/02/2019, 04/25/2019, 01/30/2020   Td 09/19/2003   Tdap 06/29/2023    Past Medical History:  Diagnosis Date    Hyperlipidemia    Hypertension    Vitamin D  deficiency     Tobacco History: Social History   Tobacco Use  Smoking Status Never  Smokeless Tobacco Never   Counseling given: Not Answered   Outpatient Medications Prior to Visit  Medication Sig Dispense Refill   amLODipine  (NORVASC ) 5 MG tablet Take 1 tablet (5 mg total) by mouth daily. 90 tablet 3   amLODipine  (NORVASC ) 5 MG tablet Take 1 tablet (5 mg total) by mouth daily. 90 tablet 0   atorvastatin  (LIPITOR) 20 MG tablet Take 1 tablet (20 mg total) by mouth daily. 90 tablet 1   atorvastatin  (LIPITOR) 20 MG tablet Take 1 tablet (20 mg total) by mouth daily. 90 tablet 0   cetirizine  (ZYRTEC ) 10 MG tablet Take 1 tablet by mouth every day 90 tablet 3   esomeprazole  (NEXIUM ) 40 MG capsule Take 1 capsule (40 mg total) by mouth in the morning before breakfast. 90 capsule 0   esomeprazole  (NEXIUM ) 40 MG capsule Take 1 capsule (40 mg total) by mouth 2 (two) times daily before a meal. 60 capsule 3   famotidine  (PEPCID ) 40 MG tablet Take 1 tablet (40 mg total) by mouth at bedtime. 90 tablet 2   fluconazole  (DIFLUCAN ) 150 MG tablet Take 1 tablet (150 mg total) by mouth as directed. 1 tablet 1   linaclotide  (LINZESS ) 290 MCG CAPS capsule Take 1 capsule (290 mcg total) by mouth daily at least 30 minutes before the first meal of the day on an empty stomach. 90 capsule 1   LINZESS  290 MCG CAPS capsule Take 1 capsule (290 mcg total) by mouth daily before a meal. Take on an empty stomach, at least 30 minutes prior to a meal, and at approximately the same time each day. 90 capsule 0   methimazole  (TAPAZOLE ) 10 MG tablet Take 1 tablet (10 mg total) by mouth daily.     montelukast  (SINGULAIR ) 10 MG tablet Take 1 tablet (10 mg total) by mouth daily. 90 tablet 3   montelukast  (SINGULAIR ) 10 MG tablet Take 1 tablet (10 mg total) by mouth at bedtime. 90 tablet 1   nortriptyline  (PAMELOR ) 10 MG capsule Take 5 capsules (50 mg total) by mouth at bedtime. 150  capsule 11   nystatin  powder Apply 1 Application topically 3 (three) times daily. 60 g 1   nystatin -triamcinolone  ointment (MYCOLOG) Apply 1 Application topically 2 (two) times daily. 30 g 2   ondansetron  (ZOFRAN -ODT) 4 MG disintegrating tablet Take 1 tablet (4 mg total) by mouth every 8 (eight) hours as needed. 20 tablet 0   pantoprazole  (PROTONIX ) 40 MG tablet Take 1 tablet (40 mg total) by mouth 2 (two) times daily before a meal. 60 tablet 3   predniSONE  (STERAPRED UNI-PAK 21 TAB) 10 MG (21) TBPK tablet Take following package directions 21 tablet 0   promethazine -dextromethorphan (PROMETHAZINE -DM) 6.25-15 MG/5ML syrup Take 5 mLs by mouth 4 (four) times daily as needed for cough. 118 mL 0   senna (SENOKOT) 8.6 MG TABS tablet Take 1 tablet by mouth daily as needed for mild  constipation.     tirzepatide  (MOUNJARO ) 7.5 MG/0.5ML Pen Inject 7.5 mg into the skin once a week. 2 mL 2   valsartan -hydrochlorothiazide  (DIOVAN -HCT) 320-12.5 MG tablet Take 1 tablet by mouth daily. 90 tablet 1   valsartan -hydrochlorothiazide  (DIOVAN -HCT) 320-12.5 MG tablet Take 1 tablet by mouth daily. 90 tablet 0   Vitamin D , Ergocalciferol , 50000 units CAPS Take 1 capsule by mouth once a week. 12 capsule 3   No facility-administered medications prior to visit.     Review of Systems: as per hpi  Constitutional:   No  weight loss, night sweats,  Fevers, chills, fatigue, or  lassitude.  HEENT:   No headaches,  Difficulty swallowing,  Tooth/dental problems, or  Sore throat,                No sneezing, itching, ear ache, nasal congestion, post nasal drip,   CV:  No chest pain,  Orthopnea, PND, swelling in lower extremities, anasarca, dizziness, palpitations, syncope.   GI  No heartburn, indigestion, abdominal pain, nausea, vomiting, diarrhea, change in bowel habits, loss of appetite, bloody stools.   Resp: No shortness of breath with exertion or at rest.  No excess mucus, no productive cough,  No non-productive cough,   No coughing up of blood.  No change in color of mucus.  No wheezing.  No chest wall deformity  Skin: no rash or lesions.  GU: no dysuria, change in color of urine, no urgency or frequency.  No flank pain, no hematuria   MS:  No joint pain or swelling.  No decreased range of motion.  No back pain.    Physical Exam  BP 112/76   Pulse 83   Ht 5' 5 (1.651 m)   Wt 227 lb (103 kg)   SpO2 100%   BMI 37.77 kg/m   GEN: A/Ox3; pleasant , NAD, well nourished    HEENT:  [place]/AT,  EACs-clear, TMs-wnl, NOSE-clear, THROAT-clear, no lesions, no postnasal drip or exudate noted.   NECK:  Supple w/ fair ROM; no JVD; normal carotid impulses w/o bruits; no thyromegaly or nodules palpated; no lymphadenopathy.    RESP  Clear  P & A; w/o, wheezes/ rales/ or rhonchi. no accessory muscle use, no dullness to percussion  CARD:  RRR, no m/r/g, no peripheral edema, pulses intact, no cyanosis or clubbing.  GI:   Soft & nt; nml bowel sounds; no organomegaly or masses detected.   Musco: Warm bil, no deformities or joint swelling noted.   Neuro: alert, no focal deficits noted.    Skin: Warm, no lesions or rashes    Lab Results:  CBC    Component Value Date/Time   WBC 7.7 06/24/2023 0815   WBC 9.4 04/10/2020 1817   RBC 5.02 06/24/2023 0815   RBC 4.90 04/10/2020 1817   HGB 14.7 06/24/2023 0815   HCT 44.0 06/24/2023 0815   PLT 348 06/24/2023 0815   MCV 88 06/24/2023 0815   MCH 29.3 06/24/2023 0815   MCH 29.6 04/10/2020 1817   MCHC 33.4 06/24/2023 0815   MCHC 34.1 04/10/2020 1817   RDW 13.7 06/24/2023 0815   LYMPHSABS 2.0 06/24/2023 0815   MONOABS 0.6 04/10/2020 1817   EOSABS 0.1 06/24/2023 0815   BASOSABS 0.0 06/24/2023 0815    BMET    Component Value Date/Time   NA 137 06/24/2023 0815   K 3.9 06/24/2023 0815   CL 102 06/24/2023 0815   CO2 16 (L) 06/24/2023 0815   GLUCOSE 91 06/24/2023 0815  GLUCOSE 97 04/09/2023 0832   BUN 13 06/24/2023 0815   CREATININE 1.12 (H) 06/24/2023 0815    CALCIUM  9.4 06/24/2023 0815   GFRNONAA 54 (L) 01/23/2020 1607   GFRAA 62 01/23/2020 1607    BNP No results found for: BNP  ProBNP No results found for: PROBNP  Imaging: No results found.  Administration History     None           No data to display          No results found for: NITRICOXIDE   Assessment & Plan:   Assessment & Plan OSA (obstructive sleep apnea)  Assessment and Plan Assessment & Plan Obstructive sleep apnea Mild obstructive sleep apnea with 13.7 events/hour and oxygen desaturation to 76%. Symptoms improved with weight loss and lifestyle changes. Anticipated benefit from mouthpiece fitting. - Continue sleep hygiene. - Proceed with mouthpiece fitting; scheduled for December 18th. - Repeat sleep study post-mouthpiece adjustment. - Follow-up in three months to evaluate mouthpiece effectiveness and to reevaluate for follow up sleep study while using mouthpiece.    Return in about 3 months (around 03/28/2024) for sleep review with mouthpiece.  Candis Dandy, PA-C 12/29/2023

## 2023-12-29 NOTE — Patient Instructions (Signed)
 Keep appointment with Orthodontist for fitting of oral mouthpiece for sleep.  Follow up in 3 months.

## 2024-01-02 ENCOUNTER — Other Ambulatory Visit (HOSPITAL_BASED_OUTPATIENT_CLINIC_OR_DEPARTMENT_OTHER): Payer: Self-pay

## 2024-01-03 ENCOUNTER — Other Ambulatory Visit: Payer: Self-pay | Admitting: Family Medicine

## 2024-01-03 ENCOUNTER — Other Ambulatory Visit (HOSPITAL_BASED_OUTPATIENT_CLINIC_OR_DEPARTMENT_OTHER): Payer: Self-pay

## 2024-01-04 ENCOUNTER — Other Ambulatory Visit: Payer: Self-pay

## 2024-01-05 ENCOUNTER — Inpatient Hospital Stay (HOSPITAL_BASED_OUTPATIENT_CLINIC_OR_DEPARTMENT_OTHER)
Admission: RE | Admit: 2024-01-05 | Discharge: 2024-01-05 | Attending: Nurse Practitioner | Admitting: Nurse Practitioner

## 2024-01-05 DIAGNOSIS — Z1231 Encounter for screening mammogram for malignant neoplasm of breast: Secondary | ICD-10-CM

## 2024-01-08 LAB — T4, FREE: Free T4: 0.54 ng/dL — ABNORMAL LOW (ref 0.82–1.77)

## 2024-01-11 ENCOUNTER — Other Ambulatory Visit: Payer: Self-pay

## 2024-01-11 ENCOUNTER — Other Ambulatory Visit: Payer: Self-pay | Admitting: Family Medicine

## 2024-01-11 ENCOUNTER — Other Ambulatory Visit (HOSPITAL_BASED_OUTPATIENT_CLINIC_OR_DEPARTMENT_OTHER): Payer: Self-pay

## 2024-01-11 MED ORDER — ESOMEPRAZOLE MAGNESIUM 40 MG PO CPDR
40.0000 mg | DELAYED_RELEASE_CAPSULE | Freq: Every day | ORAL | 0 refills | Status: AC
  Filled 2024-01-16 – 2024-01-18 (×2): qty 90, 90d supply, fill #0

## 2024-01-12 ENCOUNTER — Ambulatory Visit

## 2024-01-12 ENCOUNTER — Encounter: Payer: Self-pay | Admitting: "Endocrinology

## 2024-01-12 ENCOUNTER — Other Ambulatory Visit: Payer: Self-pay

## 2024-01-12 ENCOUNTER — Ambulatory Visit (INDEPENDENT_AMBULATORY_CARE_PROVIDER_SITE_OTHER): Admitting: "Endocrinology

## 2024-01-12 ENCOUNTER — Other Ambulatory Visit (HOSPITAL_BASED_OUTPATIENT_CLINIC_OR_DEPARTMENT_OTHER): Payer: Self-pay

## 2024-01-12 ENCOUNTER — Encounter: Payer: Self-pay | Admitting: Neurology

## 2024-01-12 VITALS — BP 108/80 | HR 72 | Ht 65.0 in | Wt 226.0 lb

## 2024-01-12 DIAGNOSIS — E0500 Thyrotoxicosis with diffuse goiter without thyrotoxic crisis or storm: Secondary | ICD-10-CM

## 2024-01-12 DIAGNOSIS — E042 Nontoxic multinodular goiter: Secondary | ICD-10-CM

## 2024-01-12 MED ORDER — METHIMAZOLE 5 MG PO TABS
5.0000 mg | ORAL_TABLET | Freq: Every day | ORAL | 0 refills | Status: AC
  Filled 2024-01-12: qty 90, 90d supply, fill #0

## 2024-01-12 NOTE — Progress Notes (Signed)
 "    Outpatient Endocrinology Note Marie Birmingham, MD  01/12/2024   Marie Perez 03/05/1975 [id number]  Referring Provider: Billy Philippe SAUNDERS, NP Primary Care Provider: Siganporia, Arnaz, FNP Subjective  No chief complaint on file.   Assessment & Plan  Diagnoses and all orders for this visit:  Graves disease -     TSH -     T3, free -     T4, free  Multinodular goiter  Other orders -     methimazole  (TAPAZOLE ) 5 MG tablet; Take 1 tablet (5 mg total) by mouth daily.   Marie Perez is currently on methimazole  10 mg qd. Started methimazole  10 mg tid since 05/22/23.  Patient was last biochemically hyperthyroid.  Educated on thyroid  axis.  01/12/2024 Recommend the following: Start methimazole  5 mg every day. Repeat lab before next visit or sooner if symptoms of hyperthyroidism or hypothyroidism develop.  Notify us  immediately in case of significant weight gain or loss. Counseled on compliance and follow up needs.  Follow up with ophthalmology to discuss occasional eye concerns   03/2023 thyroid  ultrasound images and report reviewed Patient has multinodular goiter with none of the nodules meeting criteria for FNA at this point Had GI evaluation in past. Not interested in thyroidectomy  Recommend follow-up ultrasound in 1 year  I have reviewed current medications, nurse's notes, allergies, vital signs, past medical and surgical history, family medical history, and social history for this encounter. Counseled patient on symptoms, examination findings, lab findings, imaging results, treatment decisions and monitoring and prognosis. The patient understood the recommendations and agrees with the treatment plan. All questions regarding treatment plan were fully answered.   Return in about 1 month (around 02/12/2024) for tele-visit: 3:20 pm, labs before next visit.   Marie Birmingham, MD  01/12/2024   I have reviewed current medications, nurse's notes, allergies, vital  signs, past medical and surgical history, family medical history, and social history for this encounter. Counseled patient on symptoms, examination findings, lab findings, imaging results, treatment decisions and monitoring and prognosis. The patient understood the recommendations and agrees with the treatment plan. All questions regarding treatment plan were fully answered.   History of Present Illness Marie Perez is a 48 y.o. year old female who presents to our clinic with hyperthyroidism diagnosed in 12/2022.    On Methimazole  10 mg tid since 05/22/23  Symptoms suggestive of HYPOTHYROIDISM:  fatigue Yes, some days weight gain No cold intolerance  No constipation  No, resolved   Symptoms suggestive of HYPERTHYROIDISM:  weight loss  Yes, on mounjaro  + changed eating habits  heat intolerance No, resolved  hyperdefecation  No palpitations  No  Compressive symptoms:  dysphagia  Yes, sometimes. Had GI evaluation in past. Not interested in thyroidectomy  dysphonia  Yes, sometimes  positional dyspnea (especially with simultaneous arms elevation)  No  Smokes  No On biotin  No Personal history of head/neck surgery/irradiation  No  Grave's Ophthalmopathy Clinical Activity Score: No concerns reported  Adverse Drug Effects from Methimazole  (MMI): rash No fever No throat pain No arthritis No mouth ulcers No jaundice No loss of appetite Yes, sometimes  lymphadenopathy No  03/2023 THYROID  ULTRASOUND   TECHNIQUE: Ultrasound examination of the thyroid  gland and adjacent soft tissues was performed.   COMPARISON:  None Available.   FINDINGS: Parenchymal Echotexture: Mildly heterogenous   Isthmus: 0.9 cm   Right lobe: 6.7 x 3.3 x 2.2 cm   Left lobe: 6.1 x 2.4 x 2.3 cm  _________________________________________________________   Estimated total number of nodules >/= 1 cm: 4   Number of spongiform nodules >/=  2 cm not described below (TR1): 0   Number of mixed cystic  and solid nodules >/= 1.5 cm not described below (TR2): 0   _________________________________________________________   Heterogeneous and enlarged thyroid  gland containing nearly innumerable tiny cysts and nodules. The vast majority of the lesions are less than 5 mm in size and warrant no discrete description. Additional nodules as below.   Nodule # 1: Isoechoic solid nodule in the right upper gland measures 1.3 x 0.8 x 1.1 cm. Findings are consistent with TI-RADS category 3. Given size (<1.4 cm) and appearance, this nodule does NOT meet TI-RADS criteria for biopsy or dedicated follow-up.   Nodule # 2: Mixed cystic and solid nodule in the right mid gland. TI-RADS category 2. This nodule does NOT meet TI-RADS criteria for biopsy or dedicated follow-up.   Nodule # 3: Spongiform nodule in the left mid gland measures 1.1 cm. TI-RADS category 2. This nodule does NOT meet TI-RADS criteria for biopsy or dedicated follow-up.   Nodule # 4: Mixed cystic and solid nodule in the left lower gland measures up to 1.1 cm. TI-RADS category 2. This nodule does NOT meet TI-RADS criteria for biopsy or dedicated follow-up.   IMPRESSION: 1. Mildly enlarged thyroid  gland containing nearly innumerable tiny thyroid  cysts and nodules. 2. No individual nodule meets criteria to recommend biopsy or imaging follow-up.  Physical Exam  BP 108/80   Pulse 72   Ht 5' 5 (1.651 m)   Wt 226 lb (102.5 kg)   SpO2 98%   BMI 37.61 kg/m  Constitutional: well developed, well nourished Head: normocephalic, atraumatic, no exophthalmos Eyes: sclera anicteric, no redness Neck: + thyromegaly, no thyroid  tenderness; + nodules  Lungs: normal respiratory effort Neurology: alert and oriented, no fine hand tremor Skin: dry, no appreciable rashes Musculoskeletal: no appreciable defects Psychiatric: normal mood and affect  Allergies Not on File  Current Medications Patient's Medications  New Prescriptions   No  medications on file  Previous Medications   AMLODIPINE  (NORVASC ) 5 MG TABLET    Take 1 tablet (5 mg total) by mouth daily.   AMLODIPINE  (NORVASC ) 5 MG TABLET    Take 1 tablet (5 mg total) by mouth daily.   ATORVASTATIN  (LIPITOR) 20 MG TABLET    Take 1 tablet (20 mg total) by mouth daily.   ATORVASTATIN  (LIPITOR) 20 MG TABLET    Take 1 tablet (20 mg total) by mouth daily.   CETIRIZINE  (ZYRTEC ) 10 MG TABLET    Take 1 tablet by mouth every day   ESOMEPRAZOLE  (NEXIUM ) 40 MG CAPSULE    Take 1 capsule (40 mg total) by mouth 2 (two) times daily before a meal.   ESOMEPRAZOLE  (NEXIUM ) 40 MG CAPSULE    Take 1 capsule (40 mg total) by mouth daily before breakfast.   FAMOTIDINE  (PEPCID ) 40 MG TABLET    Take 1 tablet (40 mg total) by mouth at bedtime.   FLUCONAZOLE  (DIFLUCAN ) 150 MG TABLET    Take 1 tablet (150 mg total) by mouth as directed.   LINACLOTIDE  (LINZESS ) 290 MCG CAPS CAPSULE    Take 1 capsule (290 mcg total) by mouth daily at least 30 minutes before the first meal of the day on an empty stomach.   LINZESS  290 MCG CAPS CAPSULE    Take 1 capsule (290 mcg total) by mouth daily before a meal. Take on an empty stomach,  at least 30 minutes prior to a meal, and at approximately the same time each day.   MONTELUKAST  (SINGULAIR ) 10 MG TABLET    Take 1 tablet (10 mg total) by mouth daily.   MONTELUKAST  (SINGULAIR ) 10 MG TABLET    Take 1 tablet (10 mg total) by mouth at bedtime.   NORTRIPTYLINE  (PAMELOR ) 10 MG CAPSULE    Take 5 capsules (50 mg total) by mouth at bedtime.   NYSTATIN  POWDER    Apply 1 Application topically 3 (three) times daily.   NYSTATIN -TRIAMCINOLONE  OINTMENT (MYCOLOG)    Apply 1 Application topically 2 (two) times daily.   ONDANSETRON  (ZOFRAN -ODT) 4 MG DISINTEGRATING TABLET    Take 1 tablet (4 mg total) by mouth every 8 (eight) hours as needed.   PANTOPRAZOLE  (PROTONIX ) 40 MG TABLET    Take 1 tablet (40 mg total) by mouth 2 (two) times daily before a meal.   PREDNISONE  (STERAPRED UNI-PAK  21 TAB) 10 MG (21) TBPK TABLET    Take following package directions   PROMETHAZINE -DEXTROMETHORPHAN (PROMETHAZINE -DM) 6.25-15 MG/5ML SYRUP    Take 5 mLs by mouth 4 (four) times daily as needed for cough.   SENNA (SENOKOT) 8.6 MG TABS TABLET    Take 1 tablet by mouth daily as needed for mild constipation.   TIRZEPATIDE  (MOUNJARO ) 7.5 MG/0.5ML PEN    Inject 7.5 mg into the skin once a week.   VALSARTAN -HYDROCHLOROTHIAZIDE  (DIOVAN -HCT) 320-12.5 MG TABLET    Take 1 tablet by mouth daily.   VALSARTAN -HYDROCHLOROTHIAZIDE  (DIOVAN -HCT) 320-12.5 MG TABLET    Take 1 tablet by mouth daily.  Modified Medications   Modified Medication Previous Medication   METHIMAZOLE  (TAPAZOLE ) 5 MG TABLET methimazole  (TAPAZOLE ) 10 MG tablet      Take 1 tablet (5 mg total) by mouth daily.    Take 1 tablet (10 mg total) by mouth daily.  Discontinued Medications   No medications on file    Past Medical History Past Medical History:  Diagnosis Date   Hyperlipidemia    Hypertension    Vitamin D  deficiency     Past Surgical History Past Surgical History:  Procedure Laterality Date   CESAREAN SECTION  01/21/2008   COLONOSCOPY     UPPER GI ENDOSCOPY      Family History family history includes Diabetes in her father; High Cholesterol in her mother; Hypertension in her mother.  Social History Social History   Socioeconomic History   Marital status: Married    Spouse name: Not on file   Number of children: 1   Years of education: Not on file   Highest education level: Associate degree: occupational, scientist, product/process development, or vocational program  Occupational History   Occupation: Archivist  Tobacco Use   Smoking status: Never   Smokeless tobacco: Never  Vaping Use   Vaping status: Never Used  Substance and Sexual Activity   Alcohol use: No   Drug use: No   Sexual activity: Yes    Birth control/protection: None  Other Topics Concern   Not on file  Social History Narrative   Are you right handed or left handed?  Right Handed   Are you currently employed ? 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 No   Who lives with you? Husband   What type of home do you live in: 1 story or 2 story? Lives in a one story home  Social Drivers of Health   Tobacco Use: Low Risk (01/12/2024)   Patient History    Smoking Tobacco Use: Never    Smokeless Tobacco Use: Never    Passive Exposure: Not on file  Financial Resource Strain: Low Risk (01/15/2023)   Overall Financial Resource Strain (CARDIA)    Difficulty of Paying Living Expenses: Not hard at all  Food Insecurity: Low Risk (09/11/2023)   Received from [facility]   Epic    Within the past 12 months, you worried that your food would run out before you got money to buy more: Never true    Within the past 12 months, the food you bought just didn't last and you didn't have money to get more. : Never true  Transportation Needs: No Transportation Needs (09/11/2023)   Received from [facility]   Transportation    In the past 12 months, has lack of reliable transportation kept you from medical appointments, meetings, work or from getting things needed for daily living? : No  Physical Activity: Insufficiently Active (01/15/2023)   Exercise Vital Sign    Days of Exercise per Week: 2 days    Minutes of Exercise per Session: 10 min  Stress: No Stress Concern Present (01/15/2023)   [employer] of Occupational Health - Occupational Stress Questionnaire    Feeling of Stress : Not at all  Social Connections: Moderately Integrated (01/15/2023)   Social Connection and Isolation Panel    Frequency of Communication with Friends and Family: Twice a week    Frequency of Social Gatherings with Friends and Family: Once a week    Attends Religious Services: More than 4 times per year    Active Member of [employer] or Organizations: No    Attends Banker Meetings: Not on file     Marital Status: Married  Catering Manager Violence: Not At Risk (03/27/2023)   Humiliation, Afraid, Rape, and Kick questionnaire    Fear of Current or Ex-Partner: No    Emotionally Abused: No    Physically Abused: No    Sexually Abused: No  Depression (PHQ2-9): Medium Risk (02/26/2023)   Depression (PHQ2-9)    PHQ-2 Score: 7  Alcohol Screen: Low Risk (01/15/2023)   Alcohol Screen    Last Alcohol Screening Score (AUDIT): 1  Housing: Low Risk (09/11/2023)   Received from [facility]   Epic    What is your living situation today?: I have a steady place to live    Think about the place you live. Do you have problems with any of the following? Choose all that apply:: None/None on this list  Utilities: Low Risk (09/11/2023)   Received from [facility]   Utilities    In the past 12 months has the electric, gas, oil, or water company threatened to shut off services in your home? : No  Health Literacy: Adequate Health Literacy (03/27/2023)   B1300 Health Literacy    Frequency of need for help with medical instructions: Never    Laboratory Investigations Lab Results  Component Value Date   TSH 6.740 (H) 11/17/2023   TSH <0.005 (L) 06/29/2023   TSH <0.005 (L) 06/03/2023   FREET4 0.54 (L) 01/07/2024   FREET4 0.51 (L) 11/17/2023   FREET4 1.31 06/29/2023     Lab Results  Component Value Date   TSI 209 (H) 05/21/2023     No components found for: TRAB   Lab Results  Component Value Date   CHOL 131 03/27/2023   Lab Results  Component Value Date   HDL 35 (L) 03/27/2023   Lab Results  Component Value Date   LDLCALC 82 03/27/2023   Lab Results  Component Value Date   TRIG 66 03/27/2023   Lab Results  Component Value Date   CHOLHDL 3.7 03/27/2023   Lab Results  Component Value Date   CREATININE 1.12 (H) 06/24/2023   Lab Results  Component Value Date   GFR 73.43 04/09/2023      Component Value Date/Time   NA 137 06/24/2023 0815   K 3.9 06/24/2023 0815   CL 102  06/24/2023 0815   CO2 16 (L) 06/24/2023 0815   GLUCOSE 91 06/24/2023 0815   GLUCOSE 97 04/09/2023 0832   BUN 13 06/24/2023 0815   CREATININE 1.12 (H) 06/24/2023 0815   CALCIUM  9.4 06/24/2023 0815   PROT 7.2 06/24/2023 0815   ALBUMIN 4.3 06/24/2023 0815   AST 14 06/24/2023 0815   ALT 14 06/24/2023 0815   ALKPHOS 104 06/24/2023 0815   BILITOT 0.6 06/24/2023 0815   GFRNONAA 54 (L) 01/23/2020 1607   GFRAA 62 01/23/2020 1607      Latest Ref Rng & Units 06/24/2023    8:15 AM 04/09/2023    8:32 AM 03/27/2023    1:17 PM  BMP  Glucose 70 - 99 mg/dL 91  97  91   BUN 6 - 24 mg/dL 13  12  9    Creatinine 0.57 - 1.00 mg/dL 8.87  9.07  8.90   BUN/Creat Ratio 9 - 23 12   8    Sodium 134 - 144 mmol/L 137  136  137   Potassium 3.5 - 5.2 mmol/L 3.9  3.7  4.0   Chloride 96 - 106 mmol/L 102  103  104   CO2 20 - 29 mmol/L 16  26  18    Calcium  8.7 - 10.2 mg/dL 9.4  9.6  9.5        Component Value Date/Time   WBC 7.7 06/24/2023 0815   WBC 9.4 04/10/2020 1817   RBC 5.02 06/24/2023 0815   RBC 4.90 04/10/2020 1817   HGB 14.7 06/24/2023 0815   HCT 44.0 06/24/2023 0815   PLT 348 06/24/2023 0815   MCV 88 06/24/2023 0815   MCH 29.3 06/24/2023 0815   MCH 29.6 04/10/2020 1817   MCHC 33.4 06/24/2023 0815   MCHC 34.1 04/10/2020 1817   RDW 13.7 06/24/2023 0815   LYMPHSABS 2.0 06/24/2023 0815   MONOABS 0.6 04/10/2020 1817   EOSABS 0.1 06/24/2023 0815   BASOSABS 0.0 06/24/2023 0815      Parts of this note may have been dictated using voice recognition software. There may be variances in spelling and vocabulary which are unintentional. Not all errors are proofread. Please notify the dino if any discrepancies are noted or if the meaning of any statement is not clear.    "

## 2024-01-13 ENCOUNTER — Other Ambulatory Visit (HOSPITAL_BASED_OUTPATIENT_CLINIC_OR_DEPARTMENT_OTHER): Payer: Self-pay

## 2024-01-13 ENCOUNTER — Other Ambulatory Visit: Payer: Self-pay | Admitting: Neurology

## 2024-01-13 DIAGNOSIS — G4486 Cervicogenic headache: Secondary | ICD-10-CM

## 2024-01-13 MED ORDER — METHYLPREDNISOLONE 4 MG PO TBPK
ORAL_TABLET | ORAL | 0 refills | Status: AC
  Filled 2024-01-13: qty 21, 6d supply, fill #0

## 2024-01-16 ENCOUNTER — Other Ambulatory Visit (HOSPITAL_BASED_OUTPATIENT_CLINIC_OR_DEPARTMENT_OTHER): Payer: Self-pay

## 2024-01-16 ENCOUNTER — Other Ambulatory Visit (HOSPITAL_BASED_OUTPATIENT_CLINIC_OR_DEPARTMENT_OTHER): Payer: Self-pay | Admitting: Primary Care

## 2024-01-18 ENCOUNTER — Other Ambulatory Visit: Payer: Self-pay

## 2024-01-18 ENCOUNTER — Other Ambulatory Visit (HOSPITAL_BASED_OUTPATIENT_CLINIC_OR_DEPARTMENT_OTHER): Payer: Self-pay

## 2024-01-18 ENCOUNTER — Other Ambulatory Visit: Payer: Self-pay | Admitting: Family Medicine

## 2024-01-18 MED ORDER — MOUNJARO 7.5 MG/0.5ML ~~LOC~~ SOAJ
7.5000 mg | SUBCUTANEOUS | 2 refills | Status: AC
  Filled 2024-01-18 – 2024-02-06 (×2): qty 2, 28d supply, fill #0

## 2024-01-19 ENCOUNTER — Other Ambulatory Visit (HOSPITAL_BASED_OUTPATIENT_CLINIC_OR_DEPARTMENT_OTHER): Payer: Self-pay

## 2024-01-19 MED ORDER — AMOXICILLIN-POT CLAVULANATE 875-125 MG PO TABS
1.0000 | ORAL_TABLET | Freq: Two times a day (BID) | ORAL | 0 refills | Status: AC
  Filled 2024-01-19: qty 14, 7d supply, fill #0

## 2024-01-19 MED ORDER — FLUCONAZOLE 150 MG PO TABS
150.0000 mg | ORAL_TABLET | ORAL | 0 refills | Status: AC
  Filled 2024-01-19: qty 2, 4d supply, fill #0

## 2024-02-04 ENCOUNTER — Other Ambulatory Visit

## 2024-02-05 LAB — TSH: TSH: 5.03 m[IU]/L — ABNORMAL HIGH

## 2024-02-05 LAB — T3, FREE: T3, Free: 2.9 pg/mL (ref 2.3–4.2)

## 2024-02-05 LAB — T4, FREE: Free T4: 0.8 ng/dL (ref 0.8–1.8)

## 2024-02-06 ENCOUNTER — Other Ambulatory Visit (HOSPITAL_BASED_OUTPATIENT_CLINIC_OR_DEPARTMENT_OTHER): Payer: Self-pay

## 2024-02-08 ENCOUNTER — Other Ambulatory Visit (HOSPITAL_BASED_OUTPATIENT_CLINIC_OR_DEPARTMENT_OTHER): Payer: Self-pay

## 2024-02-11 ENCOUNTER — Other Ambulatory Visit (HOSPITAL_COMMUNITY): Payer: Self-pay

## 2024-02-11 ENCOUNTER — Other Ambulatory Visit (HOSPITAL_BASED_OUTPATIENT_CLINIC_OR_DEPARTMENT_OTHER): Payer: Self-pay

## 2024-02-11 MED ORDER — PROGESTERONE MICRONIZED 100 MG PO CAPS
100.0000 mg | ORAL_CAPSULE | Freq: Every day | ORAL | 1 refills | Status: AC
  Filled 2024-02-11: qty 30, 30d supply, fill #0

## 2024-02-11 MED ORDER — ESTRADIOL 0.0375 MG/24HR TD PTTW
1.0000 | MEDICATED_PATCH | TRANSDERMAL | 1 refills | Status: AC
  Filled 2024-02-11: qty 8, 28d supply, fill #0

## 2024-02-12 ENCOUNTER — Other Ambulatory Visit (HOSPITAL_BASED_OUTPATIENT_CLINIC_OR_DEPARTMENT_OTHER): Payer: Self-pay

## 2024-02-12 MED ORDER — ATORVASTATIN CALCIUM 20 MG PO TABS
20.0000 mg | ORAL_TABLET | Freq: Every day | ORAL | 0 refills | Status: AC
  Filled 2024-02-12: qty 90, 90d supply, fill #0

## 2024-02-12 MED ORDER — LINZESS 290 MCG PO CAPS
290.0000 ug | ORAL_CAPSULE | Freq: Every day | ORAL | 0 refills | Status: AC
  Filled 2024-02-12 – 2024-02-13 (×2): qty 90, 90d supply, fill #0

## 2024-02-13 ENCOUNTER — Other Ambulatory Visit (HOSPITAL_BASED_OUTPATIENT_CLINIC_OR_DEPARTMENT_OTHER): Payer: Self-pay

## 2024-02-15 ENCOUNTER — Telehealth: Admitting: "Endocrinology

## 2024-02-16 ENCOUNTER — Other Ambulatory Visit (HOSPITAL_BASED_OUTPATIENT_CLINIC_OR_DEPARTMENT_OTHER): Payer: Self-pay

## 2024-02-19 ENCOUNTER — Telehealth: Admitting: "Endocrinology

## 2024-02-19 ENCOUNTER — Encounter: Payer: Self-pay | Admitting: "Endocrinology

## 2024-02-19 VITALS — Ht 65.0 in | Wt 221.0 lb

## 2024-02-19 DIAGNOSIS — E042 Nontoxic multinodular goiter: Secondary | ICD-10-CM | POA: Diagnosis not present

## 2024-02-19 DIAGNOSIS — E0500 Thyrotoxicosis with diffuse goiter without thyrotoxic crisis or storm: Secondary | ICD-10-CM | POA: Diagnosis not present

## 2024-02-25 ENCOUNTER — Other Ambulatory Visit (HOSPITAL_BASED_OUTPATIENT_CLINIC_OR_DEPARTMENT_OTHER): Payer: Self-pay

## 2024-03-24 ENCOUNTER — Ambulatory Visit: Admitting: Neurology

## 2024-03-28 ENCOUNTER — Ambulatory Visit (HOSPITAL_BASED_OUTPATIENT_CLINIC_OR_DEPARTMENT_OTHER)

## 2024-05-20 ENCOUNTER — Telehealth: Admitting: "Endocrinology
